# Patient Record
Sex: Female | Born: 1985
Health system: Southern US, Community
[De-identification: ages and names within clinical notes are randomized; demographics above are authoritative.]

## PROBLEM LIST (undated history)

## (undated) ENCOUNTER — Ambulatory Visit (HOSPITAL_COMMUNITY): Admission: EM | Discharge status: Absent without leave | Payer: Medicaid Other

## (undated) ENCOUNTER — Inpatient Hospital Stay (HOSPITAL_COMMUNITY): Payer: Self-pay

## (undated) DIAGNOSIS — G43909 Migraine, unspecified, not intractable, without status migrainosus: Secondary | ICD-10-CM

## (undated) DIAGNOSIS — Z789 Other specified health status: Secondary | ICD-10-CM

## (undated) DIAGNOSIS — R87629 Unspecified abnormal cytological findings in specimens from vagina: Secondary | ICD-10-CM

## (undated) DIAGNOSIS — F319 Bipolar disorder, unspecified: Secondary | ICD-10-CM

## (undated) DIAGNOSIS — F329 Major depressive disorder, single episode, unspecified: Secondary | ICD-10-CM

## (undated) DIAGNOSIS — D573 Sickle-cell trait: Secondary | ICD-10-CM

## (undated) DIAGNOSIS — F419 Anxiety disorder, unspecified: Secondary | ICD-10-CM

## (undated) DIAGNOSIS — R51 Headache: Secondary | ICD-10-CM

## (undated) DIAGNOSIS — B379 Candidiasis, unspecified: Secondary | ICD-10-CM

## (undated) DIAGNOSIS — Z8619 Personal history of other infectious and parasitic diseases: Secondary | ICD-10-CM

## (undated) DIAGNOSIS — F32A Depression, unspecified: Secondary | ICD-10-CM

## (undated) DIAGNOSIS — IMO0002 Reserved for concepts with insufficient information to code with codable children: Secondary | ICD-10-CM

## (undated) DIAGNOSIS — I10 Essential (primary) hypertension: Secondary | ICD-10-CM

## (undated) DIAGNOSIS — B999 Unspecified infectious disease: Secondary | ICD-10-CM

## (undated) HISTORY — DX: Personal history of other infectious and parasitic diseases: Z86.19

## (undated) HISTORY — DX: Reserved for concepts with insufficient information to code with codable children: IMO0002

## (undated) HISTORY — DX: Migraine, unspecified, not intractable, without status migrainosus: G43.909

## (undated) HISTORY — DX: Sickle-cell trait: D57.3

## (undated) HISTORY — DX: Headache: R51

## (undated) HISTORY — PX: DENTAL SURGERY: SHX609

## (undated) HISTORY — PX: NO PAST SURGERIES: SHX2092

## (undated) HISTORY — DX: Essential (primary) hypertension: I10

## (undated) HISTORY — DX: Candidiasis, unspecified: B37.9

---
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
## 2006-02-17 ENCOUNTER — Inpatient Hospital Stay (HOSPITAL_COMMUNITY): Admission: AD | Admit: 2006-02-17 | Discharge: 2006-02-17 | Payer: Self-pay | Admitting: Gynecology

## 2006-02-17 ENCOUNTER — Ambulatory Visit: Payer: Self-pay | Admitting: *Deleted

## 2006-03-31 ENCOUNTER — Ambulatory Visit: Payer: Self-pay | Admitting: Gynecology

## 2006-04-08 ENCOUNTER — Inpatient Hospital Stay (HOSPITAL_COMMUNITY): Admission: AD | Admit: 2006-04-08 | Discharge: 2006-04-08 | Payer: Self-pay | Admitting: Obstetrics & Gynecology

## 2006-04-08 ENCOUNTER — Ambulatory Visit: Payer: Self-pay | Admitting: Obstetrics & Gynecology

## 2006-04-08 ENCOUNTER — Inpatient Hospital Stay (HOSPITAL_COMMUNITY): Admission: AD | Admit: 2006-04-08 | Discharge: 2006-04-11 | Payer: Self-pay | Admitting: Obstetrics & Gynecology

## 2006-04-12 ENCOUNTER — Encounter (INDEPENDENT_AMBULATORY_CARE_PROVIDER_SITE_OTHER): Payer: Self-pay | Admitting: Specialist

## 2006-04-12 ENCOUNTER — Ambulatory Visit: Payer: Self-pay | Admitting: Obstetrics and Gynecology

## 2006-04-12 ENCOUNTER — Observation Stay (HOSPITAL_COMMUNITY): Admission: AD | Admit: 2006-04-12 | Discharge: 2006-04-13 | Payer: Self-pay | Admitting: Obstetrics and Gynecology

## 2007-01-07 ENCOUNTER — Inpatient Hospital Stay (HOSPITAL_COMMUNITY): Admission: AD | Admit: 2007-01-07 | Discharge: 2007-01-07 | Payer: Self-pay | Admitting: Gynecology

## 2007-08-10 ENCOUNTER — Inpatient Hospital Stay (HOSPITAL_COMMUNITY): Admission: AD | Admit: 2007-08-10 | Discharge: 2007-08-10 | Payer: Self-pay | Admitting: Obstetrics & Gynecology

## 2008-03-21 ENCOUNTER — Emergency Department (HOSPITAL_COMMUNITY): Admission: EM | Admit: 2008-03-21 | Discharge: 2008-03-21 | Payer: Self-pay | Admitting: Emergency Medicine

## 2008-08-04 ENCOUNTER — Emergency Department (HOSPITAL_COMMUNITY): Admission: EM | Admit: 2008-08-04 | Discharge: 2008-08-04 | Payer: Self-pay | Admitting: Emergency Medicine

## 2008-10-21 ENCOUNTER — Other Ambulatory Visit: Admission: RE | Admit: 2008-10-21 | Discharge: 2008-10-21 | Payer: Self-pay | Admitting: Family Medicine

## 2009-05-17 DIAGNOSIS — R87619 Unspecified abnormal cytological findings in specimens from cervix uteri: Secondary | ICD-10-CM

## 2009-05-17 DIAGNOSIS — IMO0002 Reserved for concepts with insufficient information to code with codable children: Secondary | ICD-10-CM

## 2009-05-17 HISTORY — DX: Reserved for concepts with insufficient information to code with codable children: IMO0002

## 2009-05-17 HISTORY — DX: Unspecified abnormal cytological findings in specimens from cervix uteri: R87.619

## 2009-06-23 ENCOUNTER — Emergency Department (HOSPITAL_COMMUNITY): Admission: EM | Admit: 2009-06-23 | Discharge: 2009-06-23 | Payer: Self-pay | Admitting: Emergency Medicine

## 2009-08-21 ENCOUNTER — Other Ambulatory Visit: Admission: RE | Admit: 2009-08-21 | Discharge: 2009-08-21 | Payer: Self-pay | Admitting: Family Medicine

## 2009-12-11 ENCOUNTER — Emergency Department (HOSPITAL_COMMUNITY): Admission: EM | Admit: 2009-12-11 | Discharge: 2009-12-12 | Payer: Self-pay | Admitting: Emergency Medicine

## 2010-04-12 ENCOUNTER — Emergency Department (HOSPITAL_COMMUNITY): Admission: EM | Admit: 2010-04-12 | Discharge: 2010-04-12 | Payer: Self-pay | Admitting: Family Medicine

## 2010-04-18 ENCOUNTER — Emergency Department (HOSPITAL_COMMUNITY)
Admission: EM | Admit: 2010-04-18 | Discharge: 2010-04-18 | Payer: Self-pay | Source: Home / Self Care | Admitting: Emergency Medicine

## 2010-07-28 LAB — RAPID URINE DRUG SCREEN, HOSP PERFORMED
Amphetamines: NOT DETECTED
Barbiturates: NOT DETECTED
Benzodiazepines: NOT DETECTED
Cocaine: NOT DETECTED
Opiates: NOT DETECTED
Tetrahydrocannabinol: NOT DETECTED

## 2010-07-28 LAB — SALICYLATE LEVEL: Salicylate Lvl: <4 mg/dL (ref 2.8–20.0)

## 2010-07-28 LAB — DIFFERENTIAL
Eosinophils Relative: 1 % (ref 0–5)
Lymphs Abs: 2.2 10*3/uL (ref 0.7–4.0)
Monocytes Relative: 9 % (ref 3–12)
Neutro Abs: 3.7 10*3/uL (ref 1.7–7.7)

## 2010-07-28 LAB — CBC
Hemoglobin: 11.9 g/dL — ABNORMAL LOW (ref 12.0–15.0)
MCH: 28.3 pg (ref 26.0–34.0)
MCV: 85.7 fL (ref 78.0–100.0)
RDW: 15.4 % (ref 11.5–15.5)
WBC: 6.6 10*3/uL (ref 4.0–10.5)

## 2010-07-28 LAB — BASIC METABOLIC PANEL
CO2: 24 mEq/L (ref 19–32)
Creatinine, Ser: 0.82 mg/dL (ref 0.4–1.2)
GFR calc Af Amer: >60 mL/min (ref >60)
Glucose, Bld: 102 mg/dL — ABNORMAL HIGH (ref 70–99)
Sodium: 139 mEq/L (ref 135–145)

## 2010-07-28 LAB — ACETAMINOPHEN LEVEL: Acetaminophen (Tylenol), Serum: <10 ug/mL — ABNORMAL LOW (ref 10–30)

## 2010-08-01 LAB — CBC
Hemoglobin: 11.6 g/dL — ABNORMAL LOW (ref 12.0–15.0)
MCHC: 33.5 g/dL (ref 30.0–36.0)
RBC: 3.93 MIL/uL (ref 3.87–5.11)
WBC: 7.7 10*3/uL (ref 4.0–10.5)

## 2010-08-01 LAB — DIFFERENTIAL
Basophils Relative: 1 % (ref 0–1)
Eosinophils Relative: 1 % (ref 0–5)
Lymphocytes Relative: 25 % (ref 12–46)
Monocytes Absolute: 0.7 10*3/uL (ref 0.1–1.0)
Neutro Abs: 5 10*3/uL (ref 1.7–7.7)
Neutrophils Relative %: 65 % (ref 43–77)

## 2010-08-01 LAB — COMPREHENSIVE METABOLIC PANEL
Albumin: 3.8 g/dL (ref 3.5–5.2)
Calcium: 8.7 mg/dL (ref 8.4–10.5)
Chloride: 104 mEq/L (ref 96–112)
GFR calc Af Amer: >60 mL/min (ref >60)
GFR calc non Af Amer: >60 mL/min (ref >60)
Glucose, Bld: 91 mg/dL (ref 70–99)
Sodium: 133 mEq/L — ABNORMAL LOW (ref 135–145)
Total Protein: 7.4 g/dL (ref 6.0–8.3)

## 2010-10-02 NOTE — Discharge Summary (Signed)
NAME:  Diana Roman, Diana Roman NO.:  [id number]   MEDICAL RECORD NO.:  [id number]          PATIENT TYPE:  OBV   LOCATION:  9309                          FACILITY:  [facility]   PHYSICIAN:  Ginger Carne, MD  DATE OF BIRTH:  Dec 03, 1985   DATE OF ADMISSION:  04/12/2006  DATE OF DISCHARGE:  04/13/2006                               DISCHARGE SUMMARY   REASON FOR HOSPITALIZATION:  Pelvic pain with complaint of something  emanating from vagina.   IN-HOSPITAL PROCEDURE:  Intravenous antibiotic therapy.   FINAL DIAGNOSES:  Retained placental tissue, post partum day #3 with  endomyometritis.   HISTORY OF PRESENT ILLNESS:  This patient is a 24 year old gravida 1,  para 1, 0-0-1 African-American female postpartum day #3 on admission,  status post normal vaginal delivery, who noted something hanging out of  my vagina with complaints of pelvic cramping and chills.  The patient  was evaluated in the maternity admission unit and at that time was  afebrile with a temperature of 99.6.  She had 2+ abdominal pelvic  tenderness.  Admission white count was 8.7, H&H 31.5 and 10.6,  respectively.  The patient was placed on Ancef.  Her urinalysis was  normal.  The patient's discomfort had abated throughout her admission.   On physical examination at the time of discharge, she had minimal to no  tenderness and scant vaginal flow.  The patient is breast-feeding.  Temperature never went above 99.6.  During her course of  hospitalization, her blood pressure ranged from 150/92, and on discharge  143/96.  The patient has no known history of chronic hypertension.   The patient is breast-feeding and followed at the health department.  Doxycycline is therefore contraindicated.  She was placed on Augmentin  500 mg 1 twice daily for 10 days and Vicodin 1-2 every 6 hours as needed  for pelvic pain.  The patient was advised to follow up in the health  department in about five days to re-evaluate her blood  pressure.  She  was also asked to return the hospital if she has increased pelvic or  abdominal pain or increased bleeding.  She was also asked to check her  temperature daily, and if it is above 100.4 degrees Fahrenheit, to  return to the maternity admission unit.  If she has no symptomatology  and is afebrile, I asked her to continue with her health department post  partum visit in about four weeks.  She was encouraged to complete her  antibiotic regimen.      Ginger Carne, MD  Electronically Signed     SHB/MEDQ  D:  04/13/2006  T:  04/13/2006  Job:  [id number]

## 2011-02-16 LAB — POCT PREGNANCY, URINE: Preg Test, Ur: NEGATIVE

## 2011-02-16 LAB — POCT URINALYSIS DIP (DEVICE)
Ketones, ur: NEGATIVE mg/dL
Nitrite: NEGATIVE
Specific Gravity, Urine: 1.015 (ref 1.005–1.030)

## 2011-05-09 ENCOUNTER — Encounter (HOSPITAL_COMMUNITY): Payer: Self-pay | Admitting: *Deleted

## 2011-05-09 ENCOUNTER — Inpatient Hospital Stay (HOSPITAL_COMMUNITY)
Admission: AD | Admit: 2011-05-09 | Discharge: 2011-05-09 | Disposition: A | Discharge status: Home / Self Care | Payer: Managed Care, Other (non HMO) | Source: Ambulatory Visit | Attending: Obstetrics & Gynecology | Admitting: Obstetrics & Gynecology

## 2011-05-09 DIAGNOSIS — IMO0002 Reserved for concepts with insufficient information to code with codable children: Secondary | ICD-10-CM | POA: Insufficient documentation

## 2011-05-09 DIAGNOSIS — M545 Low back pain, unspecified: Secondary | ICD-10-CM | POA: Insufficient documentation

## 2011-05-09 DIAGNOSIS — T148XXA Other injury of unspecified body region, initial encounter: Secondary | ICD-10-CM

## 2011-05-09 HISTORY — DX: Other specified health status: Z78.9

## 2011-05-09 LAB — URINALYSIS, ROUTINE W REFLEX MICROSCOPIC
Bilirubin Urine: NEGATIVE
Ketones, ur: NEGATIVE mg/dL
Nitrite: NEGATIVE
Specific Gravity, Urine: >1.03 — ABNORMAL HIGH (ref 1.005–1.030)

## 2011-05-09 LAB — URINE MICROSCOPIC-ADD ON

## 2011-05-09 MED ORDER — CYCLOBENZAPRINE HCL 10 MG PO TABS: 10.0000 mg | ORAL_TABLET | Freq: Three times a day (TID) | ORAL | Status: AC | PRN

## 2011-05-09 NOTE — ED Provider Notes (Signed)
History   Pt presents today c/o Right low back pain that comes and goes. She states the pain started yesterday and is located only in the right lower back. She is currently on her menses and states she is having NL menstrual cramping. She denies severe abd pain, vag irritation, fever, dysuria, or any other sx at this time.  Chief Complaint  Patient presents with  . Back Pain   HPI  OB History    Grav Para Term Preterm Abortions TAB SAB Ect Mult Living   1 1 1  0 0 0 0 0 0 1      Past Medical History  Diagnosis Date  . No pertinent past medical history     Past Surgical History  Procedure Date  . Dental surgery     History reviewed. No pertinent family history.  History  Substance Use Topics  . Smoking status: Never Smoker   . Smokeless tobacco: Not on file  . Alcohol Use: No    Allergies: No Known Allergies  No prescriptions prior to admission    Review of Systems  Constitutional: Negative for fever.  HENT: Negative for neck pain.   Eyes: Negative for blurred vision.  Cardiovascular: Negative for chest pain.  Gastrointestinal: Negative for nausea, vomiting, abdominal pain, diarrhea and constipation.  Genitourinary: Negative for dysuria, urgency and frequency.  Musculoskeletal: Positive for back pain. Negative for myalgias, joint pain and falls.  Neurological: Negative for dizziness and headaches.  Psychiatric/Behavioral: Negative for depression and suicidal ideas.   Physical Exam   Blood pressure 132/88, pulse 76, temperature 98.1 F (36.7 C), resp. rate 20, height 5\' 6"  (1.676 m), weight 229 lb (103.874 kg), last menstrual period 05/09/2011, SpO2 98.00%.  Physical Exam  Nursing note and vitals reviewed. Constitutional: She is oriented to person, place, and time. She appears well-developed and well-nourished. No distress.  HENT:  Head: Normocephalic and atraumatic.  Eyes: EOM are normal. Pupils are equal, round, and reactive to light.  GI: Soft. She  exhibits no distension and no mass. There is no tenderness. There is no rebound and no guarding.  Genitourinary: Uterus normal. There is bleeding around the vagina. No vaginal discharge found.       Uterus NL size and shape. No adnexal masses. Minimal bleeding noted on exam.  Musculoskeletal: She exhibits tenderness.       Lumbar back: She exhibits tenderness.       Back:  Neurological: She is alert and oriented to person, place, and time.  Skin: Skin is warm and dry. She is not diaphoretic.  Psychiatric: She has a normal mood and affect. Her behavior is normal. Thought content normal.    MAU Course  Procedures  Results for orders placed during the hospital encounter of 05/09/11 (from the past 24 hour(s))  URINALYSIS, ROUTINE W REFLEX MICROSCOPIC     Status: Abnormal   Collection Time   05/09/11  8:45 PM      Component Value Range   Color, Urine YELLOW  YELLOW    APPearance CLEAR  CLEAR    Specific Gravity, Urine >1.030 (*) 1.005 - 1.030    pH 6.0  5.0 - 8.0    Glucose, UA NEGATIVE  NEGATIVE (mg/dL)   Hgb urine dipstick LARGE (*) NEGATIVE    Bilirubin Urine NEGATIVE  NEGATIVE    Ketones, ur NEGATIVE  NEGATIVE (mg/dL)   Protein, ur 413 (*) NEGATIVE (mg/dL)   Urobilinogen, UA 1.0  0.0 - 1.0 (mg/dL)   Nitrite NEGATIVE  NEGATIVE    Leukocytes, UA TRACE (*) NEGATIVE   URINE MICROSCOPIC-ADD ON     Status: Abnormal   Collection Time   05/09/11  8:45 PM      Component Value Range   Squamous Epithelial / LPF FEW (*) RARE    WBC, UA 3-6  <3 (WBC/hpf)   RBC / HPF TOO NUMEROUS TO COUNT  <3 (RBC/hpf)   Bacteria, UA FEW (*) RARE    Urine-Other MUCOUS PRESENT    POCT PREGNANCY, URINE     Status: Normal   Collection Time   05/09/11  8:55 PM      Component Value Range   Preg Test, Ur NEGATIVE     Urine sent for culture.  Assessment and Plan  Muscle strain: discussed with pt at length. Will give Rx for flexeril. Will send urine for culture. She will f/u with her PCP. Discussed diet,  activity, risks, and precautions.    Clinton Gallant. Rice III, DrHSc, MPAS, PA-C  05/09/2011, 9:05 PM   Henrietta Hoover, PA 05/09/11 2115

## 2011-05-09 NOTE — Progress Notes (Signed)
Pt presents for lower back pain that started yesterday.  States pain became worse around 5 or 6 pm tonight.  Comes and goes about every 30 seconds.

## 2011-05-09 NOTE — Progress Notes (Signed)
Pt states that she started having pain in her rt flank side this morning and that it comes and goes

## 2011-05-09 NOTE — Progress Notes (Signed)
Jenean Lindau, PA at bedside.  Assessment done and poc disscussed with pt.

## 2011-05-09 NOTE — Progress Notes (Signed)
VE done per CNM 

## 2011-12-15 ENCOUNTER — Ambulatory Visit (INDEPENDENT_AMBULATORY_CARE_PROVIDER_SITE_OTHER): Payer: Private Health Insurance - Indemnity | Admitting: Obstetrics and Gynecology

## 2011-12-15 VITALS — BP 118/88 | HR 90 | Ht 66.0 in | Wt 223.0 lb

## 2011-12-15 DIAGNOSIS — N979 Female infertility, unspecified: Secondary | ICD-10-CM

## 2011-12-15 DIAGNOSIS — Z124 Encounter for screening for malignant neoplasm of cervix: Secondary | ICD-10-CM
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
## 2011-12-17 ENCOUNTER — Other Ambulatory Visit: Payer: Self-pay

## 2011-12-17 ENCOUNTER — Telehealth: Payer: Self-pay | Admitting: Obstetrics and Gynecology

## 2011-12-17 ENCOUNTER — Other Ambulatory Visit: Payer: Self-pay | Admitting: Obstetrics and Gynecology

## 2011-12-17 DIAGNOSIS — E049 Nontoxic goiter, unspecified: Secondary | ICD-10-CM

## 2011-12-17 DIAGNOSIS — N926 Irregular menstruation, unspecified: Secondary | ICD-10-CM

## 2011-12-17 LAB — PAP IG W/ RFLX HPV ASCU

## 2011-12-17 NOTE — Telephone Encounter (Signed)
Niccole/saw ND last

## 2011-12-22 ENCOUNTER — Other Ambulatory Visit: Payer: Private Health Insurance - Indemnity

## 2012-03-28 ENCOUNTER — Telehealth: Payer: Self-pay | Admitting: Obstetrics and Gynecology

## 2012-03-28 NOTE — Telephone Encounter (Signed)
nd to address.

## 2012-03-31 NOTE — Telephone Encounter (Signed)
You can tell the pt that her husbands SA was abnormal and they should schedule an appointment with me or come in as scheduled to review the results in detail.

## 2012-04-03 NOTE — Telephone Encounter (Signed)
Ng to address

## 2012-04-03 NOTE — Telephone Encounter (Signed)
Try calling pt rgd labs no answer unable to leave msg 

## 2012-04-04 ENCOUNTER — Telehealth: Payer: Self-pay | Admitting: Obstetrics and Gynecology

## 2012-04-04 NOTE — Telephone Encounter (Signed)
LVM for pt advising that results are back but provider has not reviewed yet. Will have provider review and call back with results.  [name]

## 2012-04-05 ENCOUNTER — Telehealth: Payer: Self-pay

## 2012-04-05 NOTE — Telephone Encounter (Signed)
Message copied by [name] on Wed Apr 05, 2012  8:26 AM ------      Message from: Jaymes Graff      Created: Tue Apr 04, 2012 11:09 PM       It was abnormal she needs to come in for her visit with her husband to discuss it      ----- Message -----         From: [name], CMA         Sent: 04/04/2012   2:42 PM           To: Michael Litter, MD            Pt is calling to get results of semen analysis. Is scanned into the system but no notes. Will you please look over report to advise what to tell the pt.              Thanks,      Brandye Inthavong D.

## 2012-04-05 NOTE — Telephone Encounter (Signed)
Scheduled apt Per ND to discuss semen analysis 04/18/12 @ 8:15 Pt voiced understanding Diana Roman, [name]

## 2012-04-18 ENCOUNTER — Ambulatory Visit (INDEPENDENT_AMBULATORY_CARE_PROVIDER_SITE_OTHER): Payer: Private Health Insurance - Indemnity | Admitting: Obstetrics and Gynecology

## 2012-04-18 ENCOUNTER — Encounter: Payer: Self-pay | Admitting: Obstetrics and Gynecology

## 2012-04-18 VITALS — BP 120/76 | Wt 223.0 lb

## 2012-04-18 DIAGNOSIS — N4601 Organic azoospermia: Secondary | ICD-10-CM

## 2012-04-18 DIAGNOSIS — N760 Acute vaginitis: Secondary | ICD-10-CM

## 2012-04-18 DIAGNOSIS — A499 Bacterial infection, unspecified: Secondary | ICD-10-CM

## 2012-04-18 MED ORDER — METRONIDAZOLE 500 MG PO TABS: 500.0000 mg | ORAL_TABLET | Freq: Two times a day (BID) | ORAL | Status: DC

## 2012-04-18 NOTE — Patient Instructions (Signed)
Hysterosalpingography  Hysterosalpingography is a procedure using an X-ray and contrast dye to look at the inside of your uterus and fallopian tubes. The contrast dye is injected into the uterus through the vagina and cervix while X-ray pictures are taken. This procedure may help your caregiver determine whether you have uterine tumors, adhesions, or structural abnormalities. It is commonly used to help determine reasons why a woman is unable to have children (infertile).  LET YOUR CAREGIVER KNOW ABOUT:  · Allergies to medicine or food, especially shellfish.  · Medicines taken, including vitamins, herbs, eyedrops, over-the-counter medicines, and creams.  · Use of steroids (by mouth or creams).  · Previous problems with anesthetics or numbing medicines.  · History of bleeding problems or blood clots.  · Previous pelvic surgery.  · Other health problems, including diabetes and kidney problems.  · Possibility of pregnancy.  · Any allergies to iodine or X-ray dye (contrast dye).  · Any recent pelvic infections or sexually transmitted diseases (STDs).  RISKS AND COMPLICATIONS   · Infection in the lining of the uterus (endometritis) or fallopian tubes (salpingitis).  · Damage or puncturing of the uterus or fallopian tubes.  · An allergic reaction to the contrast dye used to perform the X-ray.  BEFORE THE PROCEDURE   · Schedule the procedure after your period stops, but before your next ovulation. This is usually between day 5 and 10 of your last period. Day 1 is the first day of your period.  · Ask your caregiver about changing or stopping your regular medicines.  · You may eat and drink as normal.  · You may be given a medicine to relax you (sedative) or an over-the-counter pain medicine to lessen any discomfort during the procedure.  · Empty your bladder before the procedure begins.  PROCEDURE  · You will lie down on an X-ray table with your feet in stirrups.  · A device called a speculum will be placed into your  vagina. This allows your caregiver to see inside your vagina to the cervix.  · The cervix will be washed with a special soap.  · A thin, flexible tube will be passed through the cervix into the uterus.  · Contrast dye will be put into this tube.  · Several X-rays will be taken as the contrast dye spreads through the uterus and fallopian tubes.  · The tube will be taken out after the procedure.  · The procedure usually lasts about 15 to 30 minutes.  AFTER THE PROCEDURE   · Most of the contrast dye will flow out naturally. You may want to wear a sanitary pad.  · You may have cramping and spotting. This should go away in 24 hours.  · Ask when your test results will be ready. Make sure you get your test results.   Document Released: 06/05/2004 Document Revised: 07/26/2011 Document Reviewed: 03/23/2011  [employer]® Patient Information ©2013 [employer].

## 2012-04-18 NOTE — Progress Notes (Signed)
Discuss results of semen analysis. It was sig for very few motile sperm.  The husband said he followed instructions.   Abnormal SA husband was referred to Dr Brunilda Payor Pt to RT for day 21 labs and needs an HSG BV on pap .  Will tx with flagyl

## 2012-04-24 ENCOUNTER — Other Ambulatory Visit: Payer: Private Health Insurance - Indemnity

## 2012-04-25 ENCOUNTER — Other Ambulatory Visit: Payer: Private Health Insurance - Indemnity

## 2012-05-04 ENCOUNTER — Encounter: Payer: Private Health Insurance - Indemnity | Admitting: Obstetrics and Gynecology

## 2012-05-14 ENCOUNTER — Emergency Department (HOSPITAL_COMMUNITY)
Admission: EM | Admit: 2012-05-14 | Discharge: 2012-05-14 | Disposition: A | Discharge status: Home / Self Care | Payer: Private Health Insurance - Indemnity | Attending: Emergency Medicine | Admitting: Emergency Medicine

## 2012-05-14 ENCOUNTER — Encounter (HOSPITAL_COMMUNITY): Payer: Self-pay | Admitting: *Deleted

## 2012-05-14 DIAGNOSIS — I10 Essential (primary) hypertension: Secondary | ICD-10-CM | POA: Insufficient documentation

## 2012-05-14 DIAGNOSIS — K089 Disorder of teeth and supporting structures, unspecified: Secondary | ICD-10-CM | POA: Insufficient documentation

## 2012-05-14 DIAGNOSIS — G43909 Migraine, unspecified, not intractable, without status migrainosus: Secondary | ICD-10-CM | POA: Insufficient documentation

## 2012-05-14 DIAGNOSIS — K0889 Other specified disorders of teeth and supporting structures: Secondary | ICD-10-CM

## 2012-05-14 MED ORDER — TRAMADOL HCL 50 MG PO TABS
50.0000 mg | ORAL_TABLET | Freq: Once | ORAL | Status: AC
  Administered 2012-05-14: 50 mg via ORAL
  Filled 2012-05-14: qty 1

## 2012-05-14 MED ORDER — TRAMADOL HCL 50 MG PO TABS: 50.0000 mg | ORAL_TABLET | Freq: Four times a day (QID) | ORAL | Status: DC | PRN

## 2012-05-14 NOTE — ED Provider Notes (Signed)
Medical screening examination/treatment/procedure(s) were performed by non-physician practitioner and as supervising physician I was immediately available for consultation/collaboration.  Yetunde Leis T Tilden Broz, MD 05/14/12 2222 

## 2012-05-14 NOTE — ED Provider Notes (Signed)
History     CSN: [id number]  Arrival date & time 05/14/12  2128   First MD Initiated Contact with Patient 05/14/12 2145      Chief Complaint  Patient presents with  . Dental Pain    (Consider location/radiation/quality/duration/timing/severity/associated sxs/prior treatment) HPI Comments: Patient fisher, with, dental pain, it yesterday.  Last night.  She took some 500 mg Tylenol with little relief.  She does not have a dentist.  She also used Orajel with minimal relief  Patient is a 26 y.o. female presenting with tooth pain. The history is provided by the patient.  Dental PainThe primary symptoms include mouth pain. Primary symptoms do not include headaches or fever.  Additional symptoms do not include: facial swelling.    Past Medical History  Diagnosis Date  . No pertinent past medical history   . Abnormal Pap smear 2011  . Yeast infection   . H/O bacterial infection   . Sickle cell trait   . Headache     Frequent  . Migraine   . Hypertension   . H/O varicella     Past Surgical History  Procedure Date  . Dental surgery     No family history on file.  History  Substance Use Topics  . Smoking status: Never Smoker   . Smokeless tobacco: Not on file  . Alcohol Use: No    OB History    Grav Para Term Preterm Abortions TAB SAB Ect Mult Living   2 1 1  0 0 0 0 0 0 1      Review of Systems  Constitutional: Negative for fever and chills.  HENT: Positive for dental problem. Negative for facial swelling.   Respiratory: Negative.   Cardiovascular: Negative.   Gastrointestinal: Negative.   Genitourinary: Negative.   Musculoskeletal: Negative.   Skin: Negative.   Neurological: Negative for dizziness and headaches.  All other systems reviewed and are negative.    Allergies  Review of patient's allergies indicates no known allergies.  Home Medications   Current Outpatient Rx  Name  Route  Sig  Dispense  Refill  . ACETAMINOPHEN 500 MG PO TABS   Oral  Take 1,000 mg by mouth every 4 (four) hours as needed. For mouth pain         . ORAJEL MT   Mouth/Throat   Use as directed 1 application in the mouth or throat every hour as needed. For tooth pain         . METRONIDAZOLE 500 MG PO TABS   Oral   Take 1 tablet (500 mg total) by mouth 2 (two) times daily.   14 tablet   0   . TRAMADOL HCL 50 MG PO TABS   Oral   Take 1 tablet (50 mg total) by mouth every 6 (six) hours as needed for pain.   30 tablet   0     BP 131/94  Pulse 78  Temp 98.6 F (37 C) (Oral)  Resp 20  SpO2 99%  LMP 04/03/2012  Breastfeeding? Unknown  Physical Exam  Constitutional: She appears well-developed.  HENT:  Head: Normocephalic.  Mouth/Throat: Uvula is midline.    Eyes: Pupils are equal, round, and reactive to light.  Neck: Normal range of motion.  Cardiovascular: Normal rate.   Pulmonary/Chest: Effort normal.  Musculoskeletal: Normal range of motion.  Lymphadenopathy:    She has no cervical adenopathy.  Skin: Skin is warm.    ED Course  Procedures (including critical care time)  Labs  Reviewed - No data to display No results found.   1. Pain, dental       MDM   Patient refused dental block        Arman Filter, NP 05/14/12 2201

## 2012-05-14 NOTE — ED Notes (Signed)
Pt reports Lt lower tooth pain.

## 2012-07-25 ENCOUNTER — Telehealth: Payer: Self-pay

## 2012-07-25 NOTE — Telephone Encounter (Signed)
TC from pt requesting Plan B. LMP 07/03/12 & last unprotected IC last night per pt. Plan B called to [facility] 1 tab q 12 hrs x 2 doses. Pt told to inform us if no menses within 21 days. Pt agrees and voices understanding.

## 2012-09-09 ENCOUNTER — Emergency Department (HOSPITAL_COMMUNITY)
Admission: EM | Admit: 2012-09-09 | Discharge: 2012-09-09 | Disposition: A | Discharge status: Home / Self Care | Payer: Private Health Insurance - Indemnity | Attending: Emergency Medicine | Admitting: Emergency Medicine

## 2012-09-09 ENCOUNTER — Encounter (HOSPITAL_COMMUNITY): Payer: Self-pay | Admitting: Emergency Medicine

## 2012-09-09 DIAGNOSIS — S29019A Strain of muscle and tendon of unspecified wall of thorax, initial encounter: Secondary | ICD-10-CM

## 2012-09-09 DIAGNOSIS — Z862 Personal history of diseases of the blood and blood-forming organs and certain disorders involving the immune mechanism: Secondary | ICD-10-CM | POA: Insufficient documentation

## 2012-09-09 DIAGNOSIS — Y9389 Activity, other specified: Secondary | ICD-10-CM | POA: Insufficient documentation

## 2012-09-09 DIAGNOSIS — Z8619 Personal history of other infectious and parasitic diseases: Secondary | ICD-10-CM | POA: Insufficient documentation

## 2012-09-09 DIAGNOSIS — M5380 Other specified dorsopathies, site unspecified: Secondary | ICD-10-CM | POA: Insufficient documentation

## 2012-09-09 DIAGNOSIS — I10 Essential (primary) hypertension: Secondary | ICD-10-CM | POA: Insufficient documentation

## 2012-09-09 DIAGNOSIS — Y92410 Unspecified street and highway as the place of occurrence of the external cause: Secondary | ICD-10-CM | POA: Insufficient documentation

## 2012-09-09 DIAGNOSIS — Z79899 Other long term (current) drug therapy: Secondary | ICD-10-CM | POA: Insufficient documentation

## 2012-09-09 DIAGNOSIS — S239XXA Sprain of unspecified parts of thorax, initial encounter: Secondary | ICD-10-CM | POA: Insufficient documentation

## 2012-09-09 DIAGNOSIS — Z8669 Personal history of other diseases of the nervous system and sense organs: Secondary | ICD-10-CM | POA: Insufficient documentation

## 2012-09-09 DIAGNOSIS — Z3202 Encounter for pregnancy test, result negative: Secondary | ICD-10-CM | POA: Insufficient documentation

## 2012-09-09 DIAGNOSIS — Z8679 Personal history of other diseases of the circulatory system: Secondary | ICD-10-CM | POA: Insufficient documentation

## 2012-09-09 DIAGNOSIS — M62830 Muscle spasm of back: Secondary | ICD-10-CM

## 2012-09-09 LAB — URINALYSIS, ROUTINE W REFLEX MICROSCOPIC
Leukocytes, UA: NEGATIVE
Protein, ur: NEGATIVE mg/dL
Specific Gravity, Urine: 1.027 (ref 1.005–1.030)
Urobilinogen, UA: 1 mg/dL (ref 0.0–1.0)

## 2012-09-09 LAB — POCT PREGNANCY, URINE: Preg Test, Ur: NEGATIVE

## 2012-09-09 MED ORDER — HYDROCODONE-ACETAMINOPHEN 5-325 MG PO TABS
2.0000 | ORAL_TABLET | Freq: Once | ORAL | Status: AC
  Administered 2012-09-09: 2 via ORAL
  Filled 2012-09-09: qty 2

## 2012-09-09 MED ORDER — NAPROXEN 500 MG PO TABS: 500.0000 mg | ORAL_TABLET | Freq: Two times a day (BID) | ORAL | Status: DC | PRN

## 2012-09-09 MED ORDER — DIAZEPAM 5 MG PO TABS
10.0000 mg | ORAL_TABLET | Freq: Once | ORAL | Status: AC
  Administered 2012-09-09: 10 mg via ORAL
  Filled 2012-09-09: qty 2

## 2012-09-09 MED ORDER — METHOCARBAMOL 750 MG PO TABS: 750.0000 mg | ORAL_TABLET | Freq: Four times a day (QID) | ORAL | Status: DC | PRN

## 2012-09-09 MED ORDER — HYDROCODONE-ACETAMINOPHEN 5-325 MG PO TABS: 1.0000 | ORAL_TABLET | Freq: Four times a day (QID) | ORAL | Status: DC | PRN
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
## 2012-09-09 NOTE — ED Provider Notes (Signed)
Medical screening examination/treatment/procedure(s) were performed by non-physician practitioner and as supervising physician I was immediately available for consultation/collaboration.  Jomaira Darr T Dior Dominik, MD 09/09/12 2317 
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
## 2012-09-10 ENCOUNTER — Telehealth (HOSPITAL_COMMUNITY): Payer: Self-pay | Admitting: Emergency Medicine

## 2012-09-11 ENCOUNTER — Emergency Department (INDEPENDENT_AMBULATORY_CARE_PROVIDER_SITE_OTHER)
Admission: EM | Admit: 2012-09-11 | Discharge: 2012-09-11 | Disposition: A | Discharge status: Home / Self Care | Payer: Private Health Insurance - Indemnity | Source: Home / Self Care | Attending: Emergency Medicine | Admitting: Emergency Medicine

## 2012-09-11 ENCOUNTER — Encounter (HOSPITAL_COMMUNITY): Payer: Self-pay

## 2012-09-11 DIAGNOSIS — M76899 Other specified enthesopathies of unspecified lower limb, excluding foot: Secondary | ICD-10-CM

## 2012-09-11 DIAGNOSIS — M7062 Trochanteric bursitis, left hip: Secondary | ICD-10-CM

## 2012-09-11 NOTE — ED Provider Notes (Signed)
Medical screening examination/treatment/procedure(s) were performed by non-physician practitioner and as supervising physician I was immediately available for consultation/collaboration.  Leslee Home, M.D.   Reuben Likes, MD 09/11/12 1452
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
## 2013-08-06 ENCOUNTER — Emergency Department (HOSPITAL_COMMUNITY)
Admission: EM | Admit: 2013-08-06 | Discharge: 2013-08-06 | Disposition: A | Discharge status: Home / Self Care | Payer: Private Health Insurance - Indemnity | Source: Home / Self Care | Attending: Family Medicine | Admitting: Family Medicine

## 2013-08-06 ENCOUNTER — Encounter (HOSPITAL_COMMUNITY): Payer: Self-pay | Admitting: Emergency Medicine

## 2013-08-06 DIAGNOSIS — G43009 Migraine without aura, not intractable, without status migrainosus: Secondary | ICD-10-CM

## 2013-08-06 HISTORY — DX: Bipolar disorder, unspecified: F31.9

## 2013-08-06 MED ORDER — METHYLPREDNISOLONE ACETATE 40 MG/ML IJ SUSP
80.0000 mg | Freq: Once | INTRAMUSCULAR | Status: AC
  Administered 2013-08-06: 80 mg via INTRAMUSCULAR

## 2013-08-06 MED ORDER — DIPHENHYDRAMINE HCL 50 MG/ML IJ SOLN
INTRAMUSCULAR | Status: AC
  Filled 2013-08-06: qty 1

## 2013-08-06 MED ORDER — ONDANSETRON 4 MG PO TBDP
8.0000 mg | ORAL_TABLET | Freq: Once | ORAL | Status: AC
  Administered 2013-08-06: 8 mg via ORAL

## 2013-08-06 MED ORDER — DIPHENHYDRAMINE HCL 50 MG/ML IJ SOLN
50.0000 mg | Freq: Once | INTRAMUSCULAR | Status: AC
  Administered 2013-08-06: 50 mg via INTRAMUSCULAR

## 2013-08-06 MED ORDER — ONDANSETRON 4 MG PO TBDP
ORAL_TABLET | ORAL | Status: AC
Start: 2013-08-06 — End: 2013-08-06
  Filled 2013-08-06: qty 2

## 2013-08-06 MED ORDER — METHYLPREDNISOLONE ACETATE 80 MG/ML IJ SUSP
INTRAMUSCULAR | Status: AC
  Filled 2013-08-06: qty 1

## 2013-08-06 NOTE — ED Provider Notes (Signed)
CSN: [id number]     Arrival date & time 08/06/13  1150 History   First MD Initiated Contact with Patient 08/06/13 1405     Chief Complaint  Patient presents with  . Headache   (Consider location/radiation/quality/duration/timing/severity/associated sxs/prior Treatment) Patient is a 28 y.o. female presenting with headaches. The history is provided by the patient.  Headache Pain location:  R temporal Quality:  Dull Radiates to:  Does not radiate Onset quality:  Gradual Duration:  4 days Progression:  Waxing and waning Chronicity:  Chronic Similar to prior headaches: yes   Context: bright light and loud noise   Ineffective treatments:  NSAIDs Associated symptoms: nausea and sinus pressure   Associated symptoms: no blurred vision, no neck stiffness, no numbness, no visual change and no vomiting     Past Medical History  Diagnosis Date  . No pertinent past medical history   . Abnormal Pap smear 2011  . Yeast infection   . H/O bacterial infection   . Sickle cell trait   . Headache(784.0)     Frequent  . Migraine   . Hypertension   . H/O varicella   . Bipolar 1 disorder    Past Surgical History  Procedure Laterality Date  . Dental surgery     History reviewed. No pertinent family history. History  Substance Use Topics  . Smoking status: Never Smoker   . Smokeless tobacco: Not on file  . Alcohol Use: Yes     Comment: Ocassionally wine   OB History   Grav Para Term Preterm Abortions TAB SAB Ect Mult Living   2 1 1  0 0 0 0 0 0 1     Review of Systems  Constitutional: Negative.   HENT: Positive for sinus pressure.   Eyes: Negative.  Negative for blurred vision.  Gastrointestinal: Positive for nausea. Negative for vomiting.  Musculoskeletal: Negative for neck stiffness.  Neurological: Positive for headaches. Negative for weakness and numbness.    Allergies  Review of patient's allergies indicates no known allergies.  Home Medications   Current Outpatient Rx   Name  Route  Sig  Dispense  Refill  . ALPRAZolam (XANAX) 0.5 MG tablet   Oral   Take 0.5 mg by mouth 2 (two) times daily as needed for anxiety.         . lamoTRIgine (LAMICTAL) 200 MG tablet   Oral   Take 200 mg by mouth daily.         Marland Kitchen desvenlafaxine (PRISTIQ) 50 MG 24 hr tablet   Oral   Take 50 mg by mouth at bedtime.         Marland Kitchen HYDROcodone-acetaminophen (NORCO/VICODIN) 5-325 MG per tablet   Oral   Take 1 tablet by mouth every 6 (six) hours as needed for pain (Take 1 - 2 tablets every 4 - 6 hours.).   20 tablet   0   . methocarbamol (ROBAXIN) 750 MG tablet   Oral   Take 1 tablet (750 mg total) by mouth 4 (four) times daily as needed (Take 1 tablet every 6 hours as needed for muscle spasms.).   20 tablet   0   . naproxen (NAPROSYN) 500 MG tablet   Oral   Take 1 tablet (500 mg total) by mouth 2 (two) times daily as needed.   30 tablet   0   . nortriptyline (PAMELOR) 50 MG capsule   Oral   Take 50 mg by mouth at bedtime.  BP 146/99  Pulse 52  Temp(Src) 98.5 F (36.9 C) (Oral)  Resp 16  SpO2 100%  LMP 07/23/2013 Physical Exam  Nursing note and vitals reviewed. Constitutional: She is oriented to person, place, and time. She appears well-developed and well-nourished. No distress.  HENT:  Head: Normocephalic.  Right Ear: External ear normal.  Left Ear: External ear normal.  Mouth/Throat: Oropharynx is clear and moist.  Eyes: Conjunctivae and EOM are normal. Pupils are equal, round, and reactive to light.  Neck: Normal range of motion. Neck supple.  Cardiovascular: Normal rate and normal heart sounds.   Pulmonary/Chest: Breath sounds normal.  Lymphadenopathy:    She has no cervical adenopathy.  Neurological: She is alert and oriented to person, place, and time. No cranial nerve deficit. Coordination normal.  Skin: Skin is warm and dry.    ED Course  Procedures (including critical care time) Labs Review Labs Reviewed - No data to  display Imaging Review No results found.   MDM   1. Migraine headache without aura        Linna HoffJames D Morton Simson, MD 08/06/13 1426

## 2013-08-06 NOTE — ED Notes (Signed)
State she has history of MHA . Has not had one for about 1 year. Some relief w motrin, w her 3 day duration HA . HA worse w light, noises

## 2013-09-22 ENCOUNTER — Emergency Department (HOSPITAL_COMMUNITY)
Admission: EM | Admit: 2013-09-22 | Discharge: 2013-09-22 | Disposition: A | Discharge status: Home / Self Care | Payer: Private Health Insurance - Indemnity | Attending: Emergency Medicine | Admitting: Emergency Medicine

## 2013-09-22 ENCOUNTER — Emergency Department (HOSPITAL_COMMUNITY): Payer: Private Health Insurance - Indemnity

## 2013-09-22 ENCOUNTER — Encounter (HOSPITAL_COMMUNITY): Payer: Self-pay | Admitting: Emergency Medicine

## 2013-09-22 DIAGNOSIS — G43909 Migraine, unspecified, not intractable, without status migrainosus: Secondary | ICD-10-CM | POA: Insufficient documentation

## 2013-09-22 DIAGNOSIS — F319 Bipolar disorder, unspecified: Secondary | ICD-10-CM | POA: Insufficient documentation

## 2013-09-22 DIAGNOSIS — Z862 Personal history of diseases of the blood and blood-forming organs and certain disorders involving the immune mechanism: Secondary | ICD-10-CM | POA: Insufficient documentation

## 2013-09-22 DIAGNOSIS — R1110 Vomiting, unspecified: Secondary | ICD-10-CM | POA: Insufficient documentation

## 2013-09-22 DIAGNOSIS — R0789 Other chest pain: Secondary | ICD-10-CM | POA: Insufficient documentation

## 2013-09-22 DIAGNOSIS — R197 Diarrhea, unspecified: Secondary | ICD-10-CM | POA: Insufficient documentation

## 2013-09-22 DIAGNOSIS — Z79899 Other long term (current) drug therapy: Secondary | ICD-10-CM | POA: Insufficient documentation

## 2013-09-22 DIAGNOSIS — Z8619 Personal history of other infectious and parasitic diseases: Secondary | ICD-10-CM | POA: Insufficient documentation

## 2013-09-22 DIAGNOSIS — R42 Dizziness and giddiness: Secondary | ICD-10-CM | POA: Insufficient documentation

## 2013-09-22 LAB — CBC WITH DIFFERENTIAL/PLATELET
Basophils Absolute: 0 10*3/uL (ref 0.0–0.1)
Basophils Relative: 1 % (ref 0–1)
EOS PCT: 1 % (ref 0–5)
Eosinophils Absolute: 0 10*3/uL (ref 0.0–0.7)
HEMATOCRIT: 35.7 % — AB (ref 36.0–46.0)
HEMOGLOBIN: 11.3 g/dL — AB (ref 12.0–15.0)
LYMPHS ABS: 1.4 10*3/uL (ref 0.7–4.0)
LYMPHS PCT: 38 % (ref 12–46)
MCH: 29.2 pg (ref 26.0–34.0)
MCHC: 31.7 g/dL (ref 30.0–36.0)
MCV: 92.2 fL (ref 78.0–100.0)
MONO ABS: 0.4 10*3/uL (ref 0.1–1.0)
MONOS PCT: 9 % (ref 3–12)
NEUTROS ABS: 1.9 10*3/uL (ref 1.7–7.7)
Neutrophils Relative %: 51 % (ref 43–77)
Platelets: 207 10*3/uL (ref 150–400)
RBC: 3.87 MIL/uL (ref 3.87–5.11)
RDW: 14.4 % (ref 11.5–15.5)
WBC: 3.8 10*3/uL — AB (ref 4.0–10.5)

## 2013-09-22 LAB — BASIC METABOLIC PANEL
BUN: 17 mg/dL (ref 6–23)
CHLORIDE: 105 meq/L (ref 96–112)
CO2: 26 mEq/L (ref 19–32)
Calcium: 9.1 mg/dL (ref 8.4–10.5)
Creatinine, Ser: 0.86 mg/dL (ref 0.50–1.10)
GFR calc non Af Amer: >90 mL/min (ref >90)
Glucose, Bld: 73 mg/dL (ref 70–99)
POTASSIUM: 4 meq/L (ref 3.7–5.3)
SODIUM: 142 meq/L (ref 137–147)

## 2013-09-22 LAB — I-STAT TROPONIN, ED: Troponin i, poc: 0 ng/mL (ref 0.00–0.08)

## 2013-09-22 LAB — HCG, SERUM, QUALITATIVE: Preg, Serum: NEGATIVE

## 2013-09-22 LAB — D-DIMER, QUANTITATIVE (NOT AT ARMC)

## 2013-09-22 MED ORDER — DEXAMETHASONE SODIUM PHOSPHATE 10 MG/ML IJ SOLN
10.0000 mg | Freq: Once | INTRAMUSCULAR | Status: AC
  Administered 2013-09-22: 10 mg via INTRAVENOUS
  Filled 2013-09-22: qty 1

## 2013-09-22 MED ORDER — SODIUM CHLORIDE 0.9 % IV BOLUS (SEPSIS)
1000.0000 mL | Freq: Once | INTRAVENOUS | Status: AC
Start: 2013-09-22 — End: 2013-09-22
  Administered 2013-09-22: 1000 mL via INTRAVENOUS

## 2013-09-22 MED ORDER — KETOROLAC TROMETHAMINE 30 MG/ML IJ SOLN
INTRAMUSCULAR | Status: AC
  Filled 2013-09-22: qty 1

## 2013-09-22 MED ORDER — PROCHLORPERAZINE EDISYLATE 5 MG/ML IJ SOLN
10.0000 mg | Freq: Once | INTRAMUSCULAR | Status: AC
  Administered 2013-09-22: 10 mg via INTRAVENOUS
  Filled 2013-09-22: qty 2

## 2013-09-22 MED ORDER — METHOCARBAMOL 500 MG PO TABS: 500.0000 mg | ORAL_TABLET | Freq: Two times a day (BID) | ORAL | Status: DC | PRN

## 2013-09-22 MED ORDER — KETOROLAC TROMETHAMINE 30 MG/ML IJ SOLN
15.0000 mg | Freq: Once | INTRAMUSCULAR | Status: AC
  Administered 2013-09-22: 15 mg via INTRAVENOUS
  Filled 2013-09-22: qty 1

## 2013-09-22 MED ORDER — DIPHENHYDRAMINE HCL 50 MG/ML IJ SOLN
25.0000 mg | Freq: Once | INTRAMUSCULAR | Status: AC
  Administered 2013-09-22: 25 mg via INTRAVENOUS
  Filled 2013-09-22: qty 1

## 2013-09-22 NOTE — ED Notes (Addendum)
Pt presents to department via [facility] for evaluation of R sided chest pain and lightheadedness. Pt was at work when symptoms started this morning. Also states R sided headache upon arrival. Pt is conscious alert and oriented x4. 20g L hand. No neurological deficits noted. Received 324 ASA and (2) sublingual nitroglycerin tablets.

## 2013-09-22 NOTE — ED Provider Notes (Signed)
Medical screening examination/treatment/procedure(s) were performed by non-physician practitioner and as supervising physician I was immediately available for consultation/collaboration.   EKG Interpretation None       Toy BakerAnthony T Edan Juday, MD 09/22/13 1525

## 2013-09-22 NOTE — ED Provider Notes (Signed)
CSN: [id number]     Arrival date & time 09/22/13  1053 History   First MD Initiated Contact with Patient 09/22/13 1105     Chief Complaint  Patient presents with  . Chest Pain     (Consider location/radiation/quality/duration/timing/severity/associated sxs/prior Treatment) HPI  Diana Roman is a 28 y.o. female with past medical history significant for bipolar, hypertension, migraine complaining of acute onset of sharp right anterior chest pain onset at 8:30 AM it is intermittent and lasts for a few seconds. It is described as sharp and 10 out of 10 in severity. Patient is brought in by EMS who administered full dose aspirin and 2 sublingual nitroglycerin tabs. Patient reports no improvement in symptoms status post nitroglycerin. It is associated with a lightheaded sensation, patient denies vertigo. She also endorses a right-sided headache with photophobia this is typical for her prior migraine exacerbations in location, severity and associated symptoms. Patient denies any family history of early cardiac death. Pain is not reproducible she states that it is alleviated by pressure to the affected area. She denies fever, cough, history of DVT or PE, exogenous estrogen, hemoptysis, abdominal pain. Patient states that she vomited 2 times on Tuesday and had diarrhea on Wednesday. She has had no further issues with this.  Pt denies fever, rash, confusion, cervicalgia, LOC/syncope, change in vision, recent N/V, numbness, weakness, dysarthria, ataxia, thunderclap onset, exacerbation with exertion or valsalva, exacerbation in morning, SOB, abdominal pain.   Past Medical History  Diagnosis Date  . No pertinent past medical history   . Abnormal Pap smear 2011  . Yeast infection   . H/O bacterial infection   . Sickle cell trait   . Headache(784.0)     Frequent  . Migraine   . Hypertension   . H/O varicella   . Bipolar 1 disorder    Past Surgical History  Procedure Laterality Date  . Dental surgery      No family history on file. History  Substance Use Topics  . Smoking status: Never Smoker   . Smokeless tobacco: Not on file  . Alcohol Use: Yes     Comment: social   OB History   Grav Para Term Preterm Abortions TAB SAB Ect Mult Living   2 1 1  0 0 0 0 0 0 1     Review of Systems  10 systems reviewed and found to be negative, except as noted in the HPI.  Allergies  Review of patient's allergies indicates no known allergies.  Home Medications   Prior to Admission medications   Medication Sig Start Date End Date Taking? Authorizing Provider  ALPRAZolam Prudy Feeler(XANAX) 0.5 MG tablet Take 0.5 mg by mouth 2 (two) times daily as needed for anxiety.    Historical Provider, MD  desvenlafaxine (PRISTIQ) 50 MG 24 hr tablet Take 50 mg by mouth at bedtime.    Historical Provider, MD  HYDROcodone-acetaminophen (NORCO/VICODIN) 5-325 MG per tablet Take 1 tablet by mouth every 6 (six) hours as needed for pain (Take 1 - 2 tablets every 4 - 6 hours.). 09/09/12   Hannah Muthersbaugh, PA-C  lamoTRIgine (LAMICTAL) 200 MG tablet Take 200 mg by mouth daily.    Historical Provider, MD  methocarbamol (ROBAXIN) 750 MG tablet Take 1 tablet (750 mg total) by mouth 4 (four) times daily as needed (Take 1 tablet every 6 hours as needed for muscle spasms.). 09/09/12   Hannah Muthersbaugh, PA-C  naproxen (NAPROSYN) 500 MG tablet Take 1 tablet (500 mg total) by mouth 2 (  two) times daily as needed. 09/09/12   Hannah Muthersbaugh, PA-C  nortriptyline (PAMELOR) 50 MG capsule Take 50 mg by mouth at bedtime.    Historical Provider, MD   BP 132/72  Pulse 70  Temp(Src) 98.4 F (36.9 C) (Oral)  Resp 18  SpO2 99%  LMP 09/21/2013  Physical Exam  Nursing note and vitals reviewed. Constitutional: She is oriented to person, place, and time. She appears well-developed and well-nourished. No distress.  HENT:  Head: Normocephalic and atraumatic.  Mouth/Throat: Oropharynx is clear and moist.  Eyes: Conjunctivae and EOM are  normal. Pupils are equal, round, and reactive to light.  Light sensitive  Neck: Normal range of motion. Neck supple.  No midline C-spine  tenderness to palpation or step-offs appreciated. Patient has full range of motion without pain.   Cardiovascular: Normal rate, regular rhythm and intact distal pulses.   Pulmonary/Chest: Effort normal and breath sounds normal. No stridor. No respiratory distress. She has no wheezes. She has no rales. She exhibits no tenderness.  Abdominal: Soft. Bowel sounds are normal. She exhibits no distension and no mass. There is no tenderness. There is no rebound and no guarding.  Musculoskeletal: Normal range of motion.  Neurological: She is alert and oriented to person, place, and time.  Follows commands, Clear, goal oriented speech, Strength is 5 out of 5x4 extremities, patient ambulates with a coordinated in nonantalgic gait. Sensation is grossly intact.   Skin: Skin is warm.  Psychiatric: She has a normal mood and affect.    ED Course  Procedures (including critical care time) Labs Review Labs Reviewed  CBC WITH DIFFERENTIAL - Abnormal; Notable for the following:    WBC 3.8 (*)    Hemoglobin 11.3 (*)    HCT 35.7 (*)    All other components within normal limits  HCG, SERUM, QUALITATIVE  BASIC METABOLIC PANEL  D-DIMER, QUANTITATIVE  I-STAT TROPOININ, ED    Imaging Review Dg Chest 2 View  09/22/2013   CLINICAL DATA:  CHEST PAIN  EXAM: CHEST  2 VIEW  COMPARISON:  None.  FINDINGS: The heart size and mediastinal contours are within normal limits. Both lungs are clear. The visualized skeletal structures are unremarkable.  IMPRESSION: No active cardiopulmonary disease.   Electronically Signed   By: Salome Holmes M.D.   On: 09/22/2013 12:16     EKG Interpretation None     Normal sinus rhythm at 70 beats per minute, borderline right axis deviation with normal intervals and no ST-T wave changes. No prior for comparison.  MDM   Final diagnoses:   Atypical chest pain    Filed Vitals:   09/22/13 1107 09/22/13 1222 09/22/13 1352  BP: 123/77 136/75 132/72  Pulse:  68 70  Temp: 98.4 F (36.9 C)    TempSrc: Oral    Resp: 26 18 18   SpO2: 97% 100% 99%    Medications  prochlorperazine (COMPAZINE) injection 10 mg (10 mg Intravenous Given 09/22/13 1216)  diphenhydrAMINE (BENADRYL) injection 25 mg (25 mg Intravenous Given 09/22/13 1216)  dexamethasone (DECADRON) injection 10 mg (10 mg Intravenous Given 09/22/13 1217)  sodium chloride 0.9 % bolus 1,000 mL (0 mLs Intravenous Stopped 09/22/13 1352)  ketorolac (TORADOL) 30 MG/ML injection 15 mg (15 mg Intravenous Given 09/22/13 1351)    Diana Roman is a 28 y.o. female presenting with right anterior chest pain. EKG is nonischemic, with no arrhythmia or other abnormality. Patient with no cardiac risk factors or family history of early cardiac death. D-dimer is negative.  Chest x-ray also with no abnormalities. Patient reports moderate improvement after headache cocktail, we'll add on Toradol. Patient given Robaxin at DC. Advised to follow closely with primary care. Patient verbalized understanding.  Evaluation does not show pathology that would require ongoing emergent intervention or inpatient treatment. Pt is hemodynamically stable and mentating appropriately. Discussed findings and plan with patient/guardian, who agrees with care plan. All questions answered. Return precautions discussed and outpatient follow up given.   Discharge Medication List as of 09/22/2013  1:25 PM    START taking these medications   Details  !! methocarbamol (ROBAXIN) 500 MG tablet Take 1 tablet (500 mg total) by mouth 2 (two) times daily as needed for muscle spasms., Starting 09/22/2013, Until Discontinued, Print     !! - Potential duplicate medications found. Please discuss with provider.      Note: Portions of this report may have been transcribed using voice recognition software. Every effort was made to ensure  accuracy; however, inadvertent computerized transcription errors may be present     Diana Emeryicole Marquarius Lofton, PA-C 09/22/13 1519

## 2013-09-22 NOTE — ED Notes (Signed)
Pt transported to xray 

## 2013-09-22 NOTE — Discharge Instructions (Signed)
°  Chest Pain (Nonspecific) Chest pain has many causes. Your pain could be caused by something serious, such as a heart attack or a blood clot in the lungs. It could also be caused by something less serious, such as a chest bruise or a virus. Follow up with your doctor. More lab tests or other studies may be needed to find the cause of your pain. Most of the time, nonspecific chest pain will improve within 2 to 3 days of rest and mild pain medicine. HOME CARE  For chest bruises, you may put ice on the sore area for 15-20 minutes, 03-04 times a day. Do this only if it makes you feel better.  Put ice in a plastic bag.  Place a towel between the skin and the bag.  Rest for the next 2 to 3 days.  Go back to work if the pain improves.  See your doctor if the pain lasts longer than 1 to 2 weeks.  Only take medicine as told by your doctor.  Quit smoking if you smoke. GET HELP RIGHT AWAY IF:   There is more pain or pain that spreads to the arm, neck, jaw, back, or belly (abdomen).  You have shortness of breath.  You cough more than usual or cough up blood.  You have very bad back or belly pain, feel sick to your stomach (nauseous), or throw up (vomit).  You have very bad weakness.  You pass out (faint).  You have a fever. Any of these problems may be serious and may be an emergency. Do not wait to see if the problems will go away. Get medical help right away. Call your local emergency services 911 in [place] Do not drive yourself to the hospital. MAKE SURE YOU:   Understand these instructions.  Will watch this condition.  Will get help right away if you or your child is not doing well or gets worse. Document Released: 10/20/2007 Document Revised: 07/26/2011 Document Reviewed: 10/20/2007 [facility] Patient Information 2014 [place], [place]. For pain control you may take up to 800mg  of Motrin (also known as ibuprofen). That is usually 4 over the counter pills,  3 times a day. Take with  food to minimize stomach irritation   You can also take  tylenol (acetaminophen) 975mg  (this is 3 over the counter pills) four times a day. Do not drink alcohol or combine with other medications that have acetaminophen as an ingredient (Read the labels!).    For breakthrough pain you may take Robaxin. Do not drink alcohol, drive or operate heavy machinery when taking Robaxin.  Do not hesitate to return to the Emergency Department for any new, worsening or concerning symptoms.   If you do not have a primary care doctor you can establish one at the   [facility]: [address] [place] [id number] [phone]  After you establish care. Let them know you were seen in the emergency room. They must obtain records for further management.

## 2014-03-18 ENCOUNTER — Encounter (HOSPITAL_COMMUNITY): Payer: Self-pay | Admitting: Emergency Medicine

## 2014-10-17 ENCOUNTER — Emergency Department (HOSPITAL_BASED_OUTPATIENT_CLINIC_OR_DEPARTMENT_OTHER)
Admission: EM | Admit: 2014-10-17 | Discharge: 2014-10-17 | Disposition: A | Discharge status: Home / Self Care | Payer: BLUE CROSS/BLUE SHIELD | Attending: Emergency Medicine | Admitting: Emergency Medicine

## 2014-10-17 DIAGNOSIS — I10 Essential (primary) hypertension: Secondary | ICD-10-CM | POA: Diagnosis not present

## 2014-10-17 DIAGNOSIS — Z8619 Personal history of other infectious and parasitic diseases: Secondary | ICD-10-CM | POA: Diagnosis not present

## 2014-10-17 DIAGNOSIS — G43011 Migraine without aura, intractable, with status migrainosus: Secondary | ICD-10-CM | POA: Diagnosis not present

## 2014-10-17 DIAGNOSIS — M542 Cervicalgia: Secondary | ICD-10-CM | POA: Diagnosis not present

## 2014-10-17 DIAGNOSIS — M549 Dorsalgia, unspecified: Secondary | ICD-10-CM | POA: Insufficient documentation

## 2014-10-17 DIAGNOSIS — Z862 Personal history of diseases of the blood and blood-forming organs and certain disorders involving the immune mechanism: Secondary | ICD-10-CM | POA: Insufficient documentation

## 2014-10-17 DIAGNOSIS — G43911 Migraine, unspecified, intractable, with status migrainosus: Secondary | ICD-10-CM

## 2014-10-17 DIAGNOSIS — Z79899 Other long term (current) drug therapy: Secondary | ICD-10-CM | POA: Insufficient documentation

## 2014-10-17 DIAGNOSIS — F319 Bipolar disorder, unspecified: Secondary | ICD-10-CM | POA: Diagnosis not present

## 2014-10-17 DIAGNOSIS — G43909 Migraine, unspecified, not intractable, without status migrainosus: Secondary | ICD-10-CM | POA: Diagnosis present

## 2014-10-17 MED ORDER — DIPHENHYDRAMINE HCL 50 MG/ML IJ SOLN
25.0000 mg | Freq: Once | INTRAMUSCULAR | Status: AC
  Administered 2014-10-17: 25 mg via INTRAVENOUS
  Filled 2014-10-17: qty 1

## 2014-10-17 MED ORDER — DEXAMETHASONE SODIUM PHOSPHATE 10 MG/ML IJ SOLN
10.0000 mg | Freq: Once | INTRAMUSCULAR | Status: AC
  Administered 2014-10-17: 10 mg via INTRAVENOUS
  Filled 2014-10-17: qty 1

## 2014-10-17 MED ORDER — PROMETHAZINE HCL 25 MG/ML IJ SOLN
12.5000 mg | Freq: Once | INTRAMUSCULAR | Status: AC
  Administered 2014-10-17: 12.5 mg via INTRAVENOUS
  Filled 2014-10-17: qty 1

## 2014-10-17 MED ORDER — SODIUM CHLORIDE 0.9 % IV SOLN: INTRAVENOUS | Status: DC

## 2014-10-17 MED ORDER — SODIUM CHLORIDE 0.9 % IV BOLUS (SEPSIS)
1000.0000 mL | Freq: Once | INTRAVENOUS | Status: AC
  Administered 2014-10-17: 1000 mL via INTRAVENOUS

## 2014-10-17 NOTE — ED Notes (Signed)
While pt was vomiting in a emesis bag, her dentures fell out.  Pt reports that she will remove the teeth from the bag herself.

## 2014-10-17 NOTE — ED Notes (Signed)
Pt reports migraine intermittently x 3 days.  N/V that started today.

## 2014-10-17 NOTE — Discharge Instructions (Signed)
Migraine Headache A migraine headache is very bad, throbbing pain on one or both sides of your head. Talk to your doctor about what things may bring on (trigger) your migraine headaches. HOME CARE  Only take medicines as told by your doctor.  Lie down in a dark, quiet room when you have a migraine.  Keep a journal to find out if certain things bring on migraine headaches. For example, write down:  What you eat and drink.  How much sleep you get.  Any change to your diet or medicines.  Lessen how much alcohol you drink.  Quit smoking if you smoke.  Get enough sleep.  Lessen any stress in your life.  Keep lights dim if bright lights bother you or make your migraines worse. GET HELP RIGHT AWAY IF:   Your migraine becomes really bad.  You have a fever.  You have a stiff neck.  You have trouble seeing.  Your muscles are weak, or you lose muscle control.  You lose your balance or have trouble walking.  You feel like you will pass out (faint), or you pass out.  You have really bad symptoms that are different than your first symptoms. MAKE SURE YOU:   Understand these instructions.  Will watch your condition.  Will get help right away if you are not doing well or get worse. Document Released: 02/10/2008 Document Revised: 07/26/2011 Document Reviewed: 01/08/2013 [facility] LPExitCare Patient Information 2015 [place], [place]. This information is not intended to replace advice given to you by your health care provider. Make sure you discuss any questions you have with your health care provider.   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 Return for any new or worse symptoms. Rest for the next 24 hours.

## 2014-10-17 NOTE — ED Provider Notes (Signed)
CSN: [id number]     Arrival date & time 10/17/14  1904 History  This chart was scribed for Vanetta MuldersScott Norvell Caswell, MD by Elon SpannerGarrett Cook, ED Scribe. This patient was seen in room MH01/MH01 and the patient's care was started at 8:40  PM.   Chief Complaint  Patient presents with  . Migraine   Patient is a 29 y.o. female presenting with migraines. The history is provided by the patient. No language interpreter was used.  Migraine This is a recurrent problem. The current episode started more than 2 days ago. Associated symptoms include headaches. Pertinent negatives include no chest pain, no abdominal pain and no shortness of breath. Nothing relieves the symptoms.   HPI Comments: Diana Roman is a 29 y.o. female who presents to the Emergency Department complaining of a 8/10 left-side forehead headache described as sharp/shooting onset 3 days ago.  She reports associated nausea, vomiting, photophobia.  She reports a history of previous similar episodes.  She denies fever.     Past Medical History  Diagnosis Date  . No pertinent past medical history   . Abnormal Pap smear 2011  . Yeast infection   . H/O bacterial infection   . Sickle cell trait   . Headache(784.0)     Frequent  . Migraine   . Hypertension   . H/O varicella   . Bipolar 1 disorder    Past Surgical History  Procedure Laterality Date  . Dental surgery     No family history on file. History  Substance Use Topics  . Smoking status: Never Smoker   . Smokeless tobacco: Not on file  . Alcohol Use: Yes     Comment: social   OB History    Gravida Para Term Preterm AB TAB SAB Ectopic Multiple Living   2 1 1  0 0 0 0 0 0 1     Review of Systems  Constitutional: Negative for fever and chills.  HENT: Negative for rhinorrhea and sore throat.   Eyes: Positive for photophobia and visual disturbance.  Respiratory: Negative for cough and shortness of breath.   Cardiovascular: Negative for chest pain and leg swelling.  Gastrointestinal:  Positive for nausea and vomiting. Negative for abdominal pain.  Genitourinary: Negative for dysuria.  Musculoskeletal: Positive for back pain (Chronic, normal to baseline) and neck pain.  Skin: Negative for rash.  Neurological: Positive for headaches.  Hematological: Does not bruise/bleed easily.  Psychiatric/Behavioral: Negative for confusion.      Allergies  Review of patient's allergies indicates no known allergies.  Home Medications   Prior to Admission medications   Medication Sig Start Date End Date Taking? Authorizing Provider  clonazePAM (KLONOPIN) 1 MG tablet Take 1 mg by mouth 2 (two) times daily.   Yes Historical Provider, MD  lamoTRIgine (LAMICTAL) 200 MG tablet Take 200 mg by mouth daily.    Historical Provider, MD  nortriptyline (PAMELOR) 50 MG capsule Take 50 mg by mouth at bedtime.    Historical Provider, MD   BP 135/97 mmHg  Pulse 90  Temp(Src) 98.7 F (37.1 C) (Oral)  Resp 18  Ht 5\' 5"  (1.651 m)  Wt 210 lb (95.255 kg)  BMI 34.95 kg/m2  SpO2 99%  LMP 10/14/2014 Physical Exam  Constitutional: She is oriented to person, place, and time. She appears well-developed and well-nourished. No distress.  HENT:  Head: Normocephalic and atraumatic.  Mucous membranes moist.  Eyes: Conjunctivae and EOM are normal.  Pupils normal.  Sclera clear.  Eyes track normal.  Neck: Neck supple. No tracheal deviation present.  Cardiovascular: Normal rate and regular rhythm.   No murmur heard. Pulmonary/Chest: Effort normal. No respiratory distress.  Lungs clear bilaterally.    Abdominal: Bowel sounds are normal. There is no tenderness.  Musculoskeletal: Normal range of motion.  No swelling in the ankles  Neurological: She is alert and oriented to person, place, and time. No cranial nerve deficit. She exhibits normal muscle tone. Coordination normal.  Skin: Skin is warm and dry.  Psychiatric: She has a normal mood and affect. Her behavior is normal.  Nursing note and vitals  reviewed.   ED Course  Procedures (including critical care time)  DIAGNOSTIC STUDIES: Oxygen Saturation is 99% on RA, normal by my interpretation.    COORDINATION OF CARE:  8:45 PM Discussed treatment plan with patient at bedside.  Patient acknowledges and agrees with plan.    Labs Review Labs Reviewed - No data to display  Imaging Review No results found.   EKG Interpretation None      MDM   Final diagnoses:  Intractable migraine with status migrainosus, unspecified migraine type    Patient treated with migraine cocktail and IV fluids. Patient's symptoms seem to be consistent with migraine. History of headaches that normally start on the left frontal areas like today's. Patient with some improvement with the treatment here. Patient will be discharged home. Nontoxic no acute distress no focal neuro deficits.  I personally performed the services described in this documentation, which was scribed in my presence. The recorded information has been reviewed and is accurate.     Vanetta Mulders, MD 10/17/14 2208

## 2014-11-25 ENCOUNTER — Emergency Department (HOSPITAL_BASED_OUTPATIENT_CLINIC_OR_DEPARTMENT_OTHER)
Admission: EM | Admit: 2014-11-25 | Discharge: 2014-11-25 | Disposition: A | Discharge status: Home / Self Care | Payer: BLUE CROSS/BLUE SHIELD | Attending: Emergency Medicine | Admitting: Emergency Medicine

## 2014-11-25 ENCOUNTER — Encounter (HOSPITAL_BASED_OUTPATIENT_CLINIC_OR_DEPARTMENT_OTHER): Payer: Self-pay

## 2014-11-25 DIAGNOSIS — Z862 Personal history of diseases of the blood and blood-forming organs and certain disorders involving the immune mechanism: Secondary | ICD-10-CM | POA: Insufficient documentation

## 2014-11-25 DIAGNOSIS — N390 Urinary tract infection, site not specified: Secondary | ICD-10-CM | POA: Diagnosis not present

## 2014-11-25 DIAGNOSIS — F319 Bipolar disorder, unspecified: Secondary | ICD-10-CM | POA: Diagnosis not present

## 2014-11-25 DIAGNOSIS — G43909 Migraine, unspecified, not intractable, without status migrainosus: Secondary | ICD-10-CM | POA: Diagnosis not present

## 2014-11-25 DIAGNOSIS — Z8619 Personal history of other infectious and parasitic diseases: Secondary | ICD-10-CM | POA: Diagnosis not present

## 2014-11-25 DIAGNOSIS — I10 Essential (primary) hypertension: Secondary | ICD-10-CM | POA: Insufficient documentation

## 2014-11-25 DIAGNOSIS — Z3202 Encounter for pregnancy test, result negative: Secondary | ICD-10-CM | POA: Diagnosis not present

## 2014-11-25 DIAGNOSIS — M545 Low back pain, unspecified: Secondary | ICD-10-CM

## 2014-11-25 DIAGNOSIS — Z79899 Other long term (current) drug therapy: Secondary | ICD-10-CM | POA: Insufficient documentation

## 2014-11-25 LAB — URINALYSIS, ROUTINE W REFLEX MICROSCOPIC
Bilirubin Urine: NEGATIVE
Glucose, UA: NEGATIVE mg/dL
Hgb urine dipstick: NEGATIVE
KETONES UR: NEGATIVE mg/dL
Nitrite: NEGATIVE
Protein, ur: NEGATIVE mg/dL
SPECIFIC GRAVITY, URINE: 1.024 (ref 1.005–1.030)
Urobilinogen, UA: 1 mg/dL (ref 0.0–1.0)
pH: 6 (ref 5.0–8.0)

## 2014-11-25 LAB — URINE MICROSCOPIC-ADD ON

## 2014-11-25 LAB — PREGNANCY, URINE: Preg Test, Ur: NEGATIVE

## 2014-11-25 MED ORDER — ACETAMINOPHEN 325 MG PO TABS
650.0000 mg | ORAL_TABLET | Freq: Once | ORAL | Status: AC
  Administered 2014-11-25: 650 mg via ORAL
  Filled 2014-11-25: qty 2

## 2014-11-25 MED ORDER — METHOCARBAMOL 500 MG PO TABS: 500.0000 mg | ORAL_TABLET | Freq: Two times a day (BID) | ORAL | Status: DC | PRN

## 2014-11-25 MED ORDER — NITROFURANTOIN MONOHYD MACRO 100 MG PO CAPS: 100.0000 mg | ORAL_CAPSULE | Freq: Two times a day (BID) | ORAL | Status: DC

## 2014-11-25 MED ORDER — NAPROXEN 250 MG PO TABS: 250.0000 mg | ORAL_TABLET | Freq: Two times a day (BID) | ORAL | Status: DC

## 2014-11-25 NOTE — Discharge Instructions (Signed)
Back Pain, Adult Low back pain is very common. About 1 in 5 people have back pain.The cause of low back pain is rarely dangerous. The pain often gets better over time.About half of people with a sudden onset of back pain feel better in just 2 weeks. About 8 in 10 people feel better by 6 weeks.  CAUSES Some common causes of back pain include:  Strain of the muscles or ligaments supporting the spine.  Wear and tear (degeneration) of the spinal discs.  Arthritis.  Direct injury to the back. DIAGNOSIS Most of the time, the direct cause of low back pain is not known.However, back pain can be treated effectively even when the exact cause of the pain is unknown.Answering your caregiver's questions about your overall health and symptoms is one of the most accurate ways to make sure the cause of your pain is not dangerous. If your caregiver needs more information, he or she may order lab work or imaging tests (X-rays or MRIs).However, even if imaging tests show changes in your back, this usually does not require surgery. HOME CARE INSTRUCTIONS For many people, back pain returns.Since low back pain is rarely dangerous, it is often a condition that people can learn to [facility] their own.   Remain active. It is stressful on the back to sit or stand in one place. Do not sit, drive, or stand in one place for more than 30 minutes at a time. Take short walks on level surfaces as soon as pain allows.Try to increase the length of time you walk each day.  Do not stay in bed.Resting more than 1 or 2 days can delay your recovery.  Do not avoid exercise or work.Your body is made to move.It is not dangerous to be active, even though your back may hurt.Your back will likely heal faster if you return to being active before your pain is gone.  Pay attention to your body when you bend and lift. Many people have less discomfortwhen lifting if they bend their knees, keep the load close to their bodies,and  avoid twisting. Often, the most comfortable positions are those that put less stress on your recovering back.  Find a comfortable position to sleep. Use a firm mattress and lie on your side with your knees slightly bent. If you lie on your back, put a pillow under your knees.  Only take over-the-counter or prescription medicines as directed by your caregiver. Over-the-counter medicines to reduce pain and inflammation are often the most helpful.Your caregiver may prescribe muscle relaxant drugs.These medicines help dull your pain so you can more quickly return to your normal activities and healthy exercise.  Put ice on the injured area.  Put ice in a plastic bag.  Place a towel between your skin and the bag.  Leave the ice on for 15-20 minutes, 03-04 times a day for the first 2 to 3 days. After that, ice and heat may be alternated to reduce pain and spasms.  Ask your caregiver about trying back exercises and gentle massage. This may be of some benefit.  Avoid feeling anxious or stressed.Stress increases muscle tension and can worsen back pain.It is important to recognize when you are anxious or stressed and learn ways to manage it.Exercise is a great option. SEEK MEDICAL CARE IF:  You have pain that is not relieved with rest or medicine.  You have pain that does not improve in 1 week.  You have new symptoms.  You are generally not feeling well. SEEK  IMMEDIATE MEDICAL CARE IF:   You have pain that radiates from your back into your legs.  You develop new bowel or bladder control problems.  You have unusual weakness or numbness in your arms or legs.  You develop nausea or vomiting.  You develop abdominal pain.  You feel faint. Document Released: 05/03/2005 Document Revised: 11/02/2011 Document Reviewed: 09/04/2013 [facility] Patient Information 2015 [place], [place]. This information is not intended to replace advice given to you by your health care provider. Make sure you  discuss any questions you have with your health care provider. Back Exercises Back exercises help treat and prevent back injuries. The goal of back exercises is to increase the strength of your abdominal and back muscles and the flexibility of your back. These exercises should be started when you no longer have back pain. Back exercises include:  Pelvic Tilt. Lie on your back with your knees bent. Tilt your pelvis until the lower part of your back is against the floor. Hold this position 5 to 10 sec and repeat 5 to 10 times.  Knee to Chest. Pull first 1 knee up against your chest and hold for 20 to 30 seconds, repeat this with the other knee, and then both knees. This may be done with the other leg straight or bent, whichever feels better.  Sit-Ups or Curl-Ups. Bend your knees 90 degrees. Start with tilting your pelvis, and do a partial, slow sit-up, lifting your trunk only 30 to 45 degrees off the floor. Take at least 2 to 3 seconds for each sit-up. Do not do sit-ups with your knees out straight. If partial sit-ups are difficult, simply do the above but with only tightening your abdominal muscles and holding it as directed.  Hip-Lift. Lie on your back with your knees flexed 90 degrees. Push down with your feet and shoulders as you raise your hips a couple inches off the floor; hold for 10 seconds, repeat 5 to 10 times.  Back arches. Lie on your stomach, propping yourself up on bent elbows. Slowly press on your hands, causing an arch in your low back. Repeat 3 to 5 times. Any initial stiffness and discomfort should lessen with repetition over time.  Shoulder-Lifts. Lie face down with arms beside your body. Keep hips and torso pressed to floor as you slowly lift your head and shoulders off the floor. Do not overdo your exercises, especially in the beginning. Exercises may cause you some mild back discomfort which lasts for a few minutes; however, if the pain is more severe, or lasts for more than 15  minutes, do not continue exercises until you see your caregiver. Improvement with exercise therapy for back problems is slow.  See your caregivers for assistance with developing a proper back exercise program. Document Released: 06/10/2004 Document Revised: 07/26/2011 Document Reviewed: 03/04/2011 [facility] Patient Information 2015 [place], [place]. This information is not intended to replace advice given to you by your health care provider. Make sure you discuss any questions you have with your health care provider. Urinary Tract Infection Urinary tract infections (UTIs) can develop anywhere along your urinary tract. Your urinary tract is your body's drainage system for removing wastes and extra water. Your urinary tract includes two kidneys, two ureters, a bladder, and a urethra. Your kidneys are a pair of bean-shaped organs. Each kidney is about the size of your fist. They are located below your ribs, one on each side of your spine. CAUSES Infections are caused by microbes, which are microscopic organisms, including  fungi, viruses, and bacteria. These organisms are so small that they can only be seen through a microscope. Bacteria are the microbes that most commonly cause UTIs. SYMPTOMS  Symptoms of UTIs may vary by age and gender of the patient and by the location of the infection. Symptoms in young women typically include a frequent and intense urge to urinate and a painful, burning feeling in the bladder or urethra during urination. Older women and men are more likely to be tired, shaky, and weak and have muscle aches and abdominal pain. A fever may mean the infection is in your kidneys. Other symptoms of a kidney infection include pain in your back or sides below the ribs, nausea, and vomiting. DIAGNOSIS To diagnose a UTI, your caregiver will ask you about your symptoms. Your caregiver also will ask to provide a urine sample. The urine sample will be tested for bacteria and white blood cells. White  blood cells are made by your body to help fight infection. TREATMENT  Typically, UTIs can be treated with medication. Because most UTIs are caused by a bacterial infection, they usually can be treated with the use of antibiotics. The choice of antibiotic and length of treatment depend on your symptoms and the type of bacteria causing your infection. HOME CARE INSTRUCTIONS  If you were prescribed antibiotics, take them exactly as your caregiver instructs you. Finish the medication even if you feel better after you have only taken some of the medication.  Drink enough water and fluids to keep your urine clear or pale yellow.  Avoid caffeine, tea, and carbonated beverages. They tend to irritate your bladder.  Empty your bladder often. Avoid holding urine for long periods of time.  Empty your bladder before and after sexual intercourse.  After a bowel movement, women should cleanse from front to back. Use each tissue only once. SEEK MEDICAL CARE IF:   You have back pain.  You develop a fever.  Your symptoms do not begin to resolve within 3 days. SEEK IMMEDIATE MEDICAL CARE IF:   You have severe back pain or lower abdominal pain.  You develop chills.  You have nausea or vomiting.  You have continued burning or discomfort with urination. MAKE SURE YOU:   Understand these instructions.  Will watch your condition.  Will get help right away if you are not doing well or get worse. Document Released: 02/10/2005 Document Revised: 11/02/2011 Document Reviewed: 06/11/2011 [facility] Patient Information 2015 [place]. This information is not intended to replace advice given to you by your health care provider. Make sure you discuss any questions you have with your health care provider.

## 2014-11-25 NOTE — ED Notes (Signed)
C/o "back spasms"-started last night-steady gait in heels

## 2014-11-25 NOTE — ED Notes (Signed)
Report received from Joss, RN, care assumed

## 2014-11-25 NOTE — ED Provider Notes (Signed)
CSN: [id number]     Arrival date & time 11/25/14  1311 History   First MD Initiated Contact with Patient 11/25/14 1344     Chief Complaint  Patient presents with  . Back Pain   Diana Roman is a 29 y.o. female with a history of bipolar disorder who presents to the emergency department complaining of right low back spasm since yesterday. Patient reports she has had right low back spasm since last night that is worse today. She reports she feels like her right low back is spasm is tight. She went of 7 out of 10 pain that is worse with movement. She reports that she has had pain intermittently radiate into her abdomen but she denies any current abdominal pain. Patient denies any injury or fall to her back. She does report she was lifting boxes yesterday but does not believe she injured while lifting these boxes. Patient reports taking ibuprofen 40 mg at 8 AM this morning with some relief. The patient denies fevers, chills, falls, numbness, tingling, weakness, abdominal pain, nausea, vomiting, chest pain, shortness of breath, loss of bladder control, loss of bowel control, difficulty urinating, dysuria, urinary frequency, urinary urgency, hematuria, history of cancer, or history of IV drug use.  (Consider location/radiation/quality/duration/timing/severity/associated sxs/prior Treatment) HPI  Past Medical History  Diagnosis Date  . No pertinent past medical history   . Abnormal Pap smear 2011  . Yeast infection   . H/O bacterial infection   . Sickle cell trait   . Headache(784.0)     Frequent  . Migraine   . Hypertension   . H/O varicella   . Bipolar 1 disorder    Past Surgical History  Procedure Laterality Date  . Dental surgery     No family history on file. History  Substance Use Topics  . Smoking status: Never Smoker   . Smokeless tobacco: Not on file  . Alcohol Use: Yes     Comment: social   OB History    Gravida Para Term Preterm AB TAB SAB Ectopic Multiple Living   2 1 1  0  0 0 0 0 0 1     Review of Systems  Constitutional: Negative for fever and chills.  Eyes: Negative for visual disturbance.  Respiratory: Negative for cough and shortness of breath.   Cardiovascular: Negative for chest pain.  Gastrointestinal: Negative for nausea, vomiting, abdominal pain and diarrhea.  Genitourinary: Negative for dysuria, urgency, frequency, hematuria, flank pain, decreased urine volume, vaginal bleeding, vaginal discharge, difficulty urinating and genital sores.  Musculoskeletal: Positive for back pain. Negative for gait problem and neck pain.  Skin: Negative for rash and wound.  Neurological: Negative for weakness, light-headedness, numbness and headaches.      Allergies  Review of patient's allergies indicates no known allergies.  Home Medications   Prior to Admission medications   Medication Sig Start Date End Date Taking? Authorizing Provider  clonazePAM (KLONOPIN) 1 MG tablet Take 1 mg by mouth 2 (two) times daily.    Historical Provider, MD  lamoTRIgine (LAMICTAL) 200 MG tablet Take 200 mg by mouth daily.    Historical Provider, MD  methocarbamol (ROBAXIN) 500 MG tablet Take 1 tablet (500 mg total) by mouth 2 (two) times daily as needed for muscle spasms. 11/25/14   Everlene FarrierWilliam Monzerrat Wellen, PA-C  naproxen (NAPROSYN) 250 MG tablet Take 1 tablet (250 mg total) by mouth 2 (two) times daily with a meal. 11/25/14   Everlene FarrierWilliam Gilliam Hawkes, PA-C  nitrofurantoin, macrocrystal-monohydrate, (MACROBID) 100 MG capsule  Take 1 capsule (100 mg total) by mouth 2 (two) times daily. 11/25/14   Everlene Farrier, PA-C  nortriptyline (PAMELOR) 50 MG capsule Take 50 mg by mouth at bedtime.    Historical Provider, MD   BP 133/90 mmHg  Pulse 92  Temp(Src) 98.4 F (36.9 C) (Oral)  Resp 16  Ht 5\' 6"  (1.676 m)  Wt 210 lb (95.255 kg)  BMI 33.91 kg/m2  SpO2 100%  LMP 10/24/2014 Physical Exam  Constitutional: She is oriented to person, place, and time. She appears well-developed and well-nourished. No  distress.  Nontoxic appearing.  HENT:  Head: Normocephalic and atraumatic.  Mouth/Throat: Oropharynx is clear and moist.  Eyes: Conjunctivae are normal. Pupils are equal, round, and reactive to light. Right eye exhibits no discharge. Left eye exhibits no discharge.  Neck: Normal range of motion. Neck supple.  No midline neck tenderness.  Cardiovascular: Normal rate, regular rhythm, normal heart sounds and intact distal pulses.   Pulmonary/Chest: Effort normal and breath sounds normal. No respiratory distress. She has no wheezes. She has no rales.  Lungs are clear to auscultation bilaterally.  Abdominal: Soft. Bowel sounds are normal. She exhibits no distension and no mass. There is no tenderness. There is no rebound and no guarding.  Abdomen is soft and nontender to palpation. Bowel sounds are present. Negative psoas and obturator sign. No CVA tenderness.  Musculoskeletal: She exhibits tenderness. She exhibits no edema.  Patient's right low back is tender to palpation to the right of the midline. Her musculature feels to be in spasm. There is no CVA tenderness. No midline back tenderness. No back edema, erythema, ecchymosis or deformity noted. Patient's strength is 5 out of 5 in her bilateral lower extremities. The patient is able to ambulate without difficulty or assistance.  Lymphadenopathy:    She has no cervical adenopathy.  Neurological: She is alert and oriented to person, place, and time. She has normal reflexes. She displays normal reflexes. Coordination normal.  Bilateral patellar DTRs are intact. Sensation is intact her bilateral lower extremities.  Skin: Skin is warm and dry. No rash noted. She is not diaphoretic. No erythema. No pallor.  Psychiatric: She has a normal mood and affect. Her behavior is normal.  Nursing note and vitals reviewed.   ED Course  Procedures (including critical care time) Labs Review Labs Reviewed  URINALYSIS, ROUTINE W REFLEX MICROSCOPIC (NOT AT [facility])  - Abnormal; Notable for the following:    APPearance CLOUDY (*)    Leukocytes, UA MODERATE (*)    All other components within normal limits  URINE MICROSCOPIC-ADD ON - Abnormal; Notable for the following:    Bacteria, UA MANY (*)    All other components within normal limits  URINE CULTURE  PREGNANCY, URINE    Imaging Review No results found.   EKG Interpretation None      Filed Vitals:   11/25/14 1318  BP: 133/90  Pulse: 92  Temp: 98.4 F (36.9 C)  TempSrc: Oral  Resp: 16  Height: 5\' 6"  (1.676 m)  Weight: 210 lb (95.255 kg)  SpO2: 100%     MDM   Meds given in ED:  Medications  acetaminophen (TYLENOL) tablet 650 mg (650 mg Oral Given 11/25/14 1429)    Discharge Medication List as of 11/25/2014  2:23 PM    START taking these medications   Details  methocarbamol (ROBAXIN) 500 MG tablet Take 1 tablet (500 mg total) by mouth 2 (two) times daily as needed for muscle spasms., Starting 11/25/2014,  Until Discontinued, Print    naproxen (NAPROSYN) 250 MG tablet Take 1 tablet (250 mg total) by mouth 2 (two) times daily with a meal., Starting 11/25/2014, Until Discontinued, Print    nitrofurantoin, macrocrystal-monohydrate, (MACROBID) 100 MG capsule Take 1 capsule (100 mg total) by mouth 2 (two) times daily., Starting 11/25/2014, Until Discontinued, Print        Final diagnoses:  Right-sided low back pain without sciatica  UTI (lower urinary tract infection)     Patient with right low back spasm.  No neurological deficits and normal neuro exam.  Patient can walk but states is painful.  No loss of bowel or bladder control.  No concern for cauda equina.  No fever, night sweats, weight loss, h/o cancer, IVDU.  Patient's abdomen is soft and nontender to palpation. She is a negative urine pregnancy test. Urinalysis indicated moderate leukocytes and many bacteria. The patient denies any urinary symptoms, vaginal bleeding or vaginal discharge. Patient has no CVA tenderness. Is a  patient is of moderate leukocytes and many bacteria will send her urine for culture and treat her for UTI with Macrobid. We'll treat the patient's back pain with naproxen and Robaxin. The patient denies she is driving today needs to return to work so we will give her Tylenol here in the ED. She declined a shot of toradol. I advised the patient to follow-up with their primary care provider this week. I advised the patient to return to the emergency department with new or worsening symptoms or new concerns. The patient verbalized understanding and agreement with plan.    This patient was discussed with Dr. Donnald Garre who agrees with assessment and plan.  Everlene Farrier, PA-C 11/25/14 1438  Arby Barrette, MD 11/26/14 [phone]

## 2014-11-27 ENCOUNTER — Inpatient Hospital Stay (HOSPITAL_COMMUNITY): Payer: BLUE CROSS/BLUE SHIELD

## 2014-11-27 ENCOUNTER — Encounter (HOSPITAL_COMMUNITY): Payer: Self-pay | Admitting: *Deleted

## 2014-11-27 ENCOUNTER — Inpatient Hospital Stay (HOSPITAL_COMMUNITY)
Admission: AD | Admit: 2014-11-27 | Discharge: 2014-11-27 | Disposition: A | Discharge status: Home / Self Care | Payer: BLUE CROSS/BLUE SHIELD | Source: Ambulatory Visit | Attending: Obstetrics and Gynecology | Admitting: Obstetrics and Gynecology

## 2014-11-27 DIAGNOSIS — R1032 Left lower quadrant pain: Secondary | ICD-10-CM | POA: Insufficient documentation

## 2014-11-27 DIAGNOSIS — K5900 Constipation, unspecified: Secondary | ICD-10-CM | POA: Insufficient documentation

## 2014-11-27 DIAGNOSIS — F329 Major depressive disorder, single episode, unspecified: Secondary | ICD-10-CM | POA: Insufficient documentation

## 2014-11-27 DIAGNOSIS — R109 Unspecified abdominal pain: Secondary | ICD-10-CM | POA: Diagnosis present

## 2014-11-27 HISTORY — DX: Depression, unspecified: F32.A

## 2014-11-27 HISTORY — DX: Anxiety disorder, unspecified: F41.9

## 2014-11-27 HISTORY — DX: Major depressive disorder, single episode, unspecified: F32.9

## 2014-11-27 HISTORY — DX: Unspecified infectious disease: B99.9

## 2014-11-27 HISTORY — DX: Unspecified abnormal cytological findings in specimens from vagina: R87.629

## 2014-11-27 LAB — URINE CULTURE

## 2014-11-27 LAB — URINALYSIS, ROUTINE W REFLEX MICROSCOPIC
BILIRUBIN URINE: NEGATIVE
GLUCOSE, UA: NEGATIVE mg/dL
HGB URINE DIPSTICK: NEGATIVE
Ketones, ur: NEGATIVE mg/dL
Leukocytes, UA: NEGATIVE
NITRITE: NEGATIVE
PROTEIN: NEGATIVE mg/dL
Specific Gravity, Urine: >1.03 — ABNORMAL HIGH (ref 1.005–1.030)
UROBILINOGEN UA: 0.2 mg/dL (ref 0.0–1.0)
pH: 6 (ref 5.0–8.0)

## 2014-11-27 LAB — POCT PREGNANCY, URINE: Preg Test, Ur: NEGATIVE

## 2014-11-27 MED ORDER — IBUPROFEN 800 MG PO TABS: 800.0000 mg | ORAL_TABLET | Freq: Three times a day (TID) | ORAL | Status: DC | PRN

## 2014-11-27 MED ORDER — IBUPROFEN 800 MG PO TABS
800.0000 mg | ORAL_TABLET | Freq: Once | ORAL | Status: AC
  Administered 2014-11-27: 800 mg via ORAL
  Filled 2014-11-27: qty 1

## 2014-11-27 NOTE — Discharge Instructions (Signed)
Abdominal Pain, Women °Abdominal (stomach, pelvic, or belly) pain can be caused by many things. It is important to tell your doctor: °· The location of the pain. °· Does it come and go or is it present all the time? °· Are there things that start the pain (eating certain foods, exercise)? °· Are there other symptoms associated with the pain (fever, nausea, vomiting, diarrhea)? °All of this is helpful to know when trying to find the cause of the pain. °CAUSES  °· Stomach: virus or bacteria infection, or ulcer. °· Intestine: appendicitis (inflamed appendix), regional ileitis (Crohn's disease), ulcerative colitis (inflamed colon), irritable bowel syndrome, diverticulitis (inflamed diverticulum of the colon), or cancer of the stomach or intestine. °· Gallbladder disease or stones in the gallbladder. °· Kidney disease, kidney stones, or infection. °· Pancreas infection or cancer. °· Fibromyalgia (pain disorder). °· Diseases of the female organs: °¨ Uterus: fibroid (non-cancerous) tumors or infection. °¨ Fallopian tubes: infection or tubal pregnancy. °¨ Ovary: cysts or tumors. °¨ Pelvic adhesions (scar tissue). °¨ Endometriosis (uterus lining tissue growing in the pelvis and on the pelvic organs). °¨ Pelvic congestion syndrome (female organs filling up with blood just before the menstrual period). °¨ Pain with the menstrual period. °¨ Pain with ovulation (producing an egg). °¨ Pain with an IUD (intrauterine device, birth control) in the uterus. °¨ Cancer of the female organs. °· Functional pain (pain not caused by a disease, may improve without treatment). °· Psychological pain. °· Depression. °DIAGNOSIS  °Your doctor will decide the seriousness of your pain by doing an examination. °· Blood tests. °· X-rays. °· Ultrasound. °· CT scan (computed tomography, special type of X-ray). °· MRI (magnetic resonance imaging). °· Cultures, for infection. °· Barium enema (dye inserted in the large intestine, to better view it with  X-rays). °· Colonoscopy (looking in intestine with a lighted tube). °· Laparoscopy (minor surgery, looking in abdomen with a lighted tube). °· Major abdominal exploratory surgery (looking in abdomen with a large incision). °TREATMENT  °The treatment will depend on the cause of the pain.  °· Many cases can be observed and treated at home. °· Over-the-counter medicines recommended by your caregiver. °· Prescription medicine. °· Antibiotics, for infection. °· Birth control pills, for painful periods or for ovulation pain. °· Hormone treatment, for endometriosis. °· Nerve blocking injections. °· Physical therapy. °· Antidepressants. °· Counseling with a psychologist or psychiatrist. °· Minor or major surgery. °HOME CARE INSTRUCTIONS  °· Do not take laxatives, unless directed by your caregiver. °· Take over-the-counter pain medicine only if ordered by your caregiver. Do not take aspirin because it can cause an upset stomach or bleeding. °· Try a clear liquid diet (broth or water) as ordered by your caregiver. Slowly move to a bland diet, as tolerated, if the pain is related to the stomach or intestine. °· Have a thermometer and take your temperature several times a day, and record it. °· Bed rest and sleep, if it helps the pain. °· Avoid sexual intercourse, if it causes pain. °· Avoid stressful situations. °· Keep your follow-up appointments and tests, as your caregiver orders. °· If the pain does not go away with medicine or surgery, you may try: °¨ Acupuncture. °¨ Relaxation exercises (yoga, meditation). °¨ Group therapy. °¨ Counseling. °SEEK MEDICAL CARE IF:  °· You notice certain foods cause stomach pain. °· Your home care treatment is not helping your pain. °· You need stronger pain medicine. °· You want your IUD removed. °· You feel faint or   lightheaded. °· You develop nausea and vomiting. °· You develop a rash. °· You are having side effects or an allergy to your medicine. °SEEK IMMEDIATE MEDICAL CARE IF:  °· Your  pain does not go away or gets worse. °· You have a fever. °· Your pain is felt only in portions of the abdomen. The right side could possibly be appendicitis. The left lower portion of the abdomen could be colitis or diverticulitis. °· You are passing blood in your stools (bright red or black tarry stools, with or without vomiting). °· You have blood in your urine. °· You develop chills, with or without a fever. °· You pass out. °MAKE SURE YOU:  °· Understand these instructions. °· Will watch your condition. °· Will get help right away if you are not doing well or get worse. °Document Released: 02/28/2007 Document Revised: 09/17/2013 Document Reviewed: 03/20/2009 °[employer]® Patient Information ©2015 [employer]. This information is not intended to replace advice given to you by your health care provider. Make sure you discuss any questions you have with your health care provider. ° °

## 2014-11-27 NOTE — MAU Note (Signed)
Patient presents with c/o abdominal pain since Sunday night. Denies discharge but states that she is menstruating now.

## 2014-11-27 NOTE — MAU Provider Note (Signed)
History    Diana Roman is a 29y.o. female who presents, unannounced, for abdominal pain.  Patient states that pain started yesterday . Pain located in lower left quadrant.   Patient states that she has taken midol 2pm and 1/2 of a 250 mg naproxen at 11am without relief.  Patient reports nausea, but no vomiting.  Patient taking medications for depression and migraines stating these cause constipation.  Patient reports last bowel movement on Monday.  Patient denies fever, chills, and diarrhea.  Patient denies change in sexual partners in the last months.  Patient reports recent visit to ED for back pain and was started on naproxen and macrobid.  Patient states no issues with urination or back at current.   There are no active problems to display for this patient.   Chief Complaint  Patient presents with  . Abdominal Pain   HPI  OB History    Gravida Para Term Preterm AB TAB SAB Ectopic Multiple Living   1 1 1  0 0 0 0 0 0 1      Past Medical History  Diagnosis Date  . No pertinent past medical history   . Abnormal Pap smear 2011  . Yeast infection   . H/O bacterial infection   . Sickle cell trait   . Headache(784.0)     Frequent  . Migraine   . Hypertension   . H/O varicella   . Bipolar 1 disorder   . Anxiety   . Depression     on meds, doing well  . Infection     UTI  . Vaginal Pap smear, abnormal     repeat was normal    Past Surgical History  Procedure Laterality Date  . Dental surgery    . No past surgeries      Family History  Problem Relation Age of Onset  . Hypertension Mother   . Hypertension Sister   . Diabetes Maternal Grandmother   . Glaucoma Maternal Grandmother     History  Substance Use Topics  . Smoking status: Never Smoker   . Smokeless tobacco: Never Used  . Alcohol Use: Yes     Comment: 2/wk    Allergies: No Known Allergies  Prescriptions prior to admission  Medication Sig Dispense Refill Last Dose  . clonazePAM (KLONOPIN) 1 MG tablet  Take 1 mg by mouth 2 (two) times daily as needed for anxiety.    Past Month at Unknown time  . ibuprofen (ADVIL,MOTRIN) 200 MG tablet Take 600 mg by mouth every 6 (six) hours as needed for moderate pain.   Past Week at Unknown time  . lamoTRIgine (LAMICTAL) 200 MG tablet Take 200 mg by mouth daily.   11/26/2014 at Unknown time  . methocarbamol (ROBAXIN) 500 MG tablet Take 1 tablet (500 mg total) by mouth 2 (two) times daily as needed for muscle spasms. 20 tablet 0 11/26/2014 at Unknown time  . naproxen (NAPROSYN) 250 MG tablet Take 1 tablet (250 mg total) by mouth 2 (two) times daily with a meal. 30 tablet 0 11/27/2014 at Unknown time  . nitrofurantoin, macrocrystal-monohydrate, (MACROBID) 100 MG capsule Take 1 capsule (100 mg total) by mouth 2 (two) times daily. 14 capsule 0 11/27/2014 at Unknown time  . nortriptyline (PAMELOR) 50 MG capsule Take 50 mg by mouth at bedtime.   11/26/2014 at Unknown time    ROS  See HPI Above Physical Exam   Blood pressure 132/85, pulse 80, temperature 98.6 F (37 C), temperature source Oral,  resp. rate 18, height  (1.676 m), weight 98.487 kg (217 lb 2 oz), last menstrual period 11/26/2014.  Results for orders placed or performed during the hospital encounter of 11/27/14 (from the past 24 hour(s))  Urinalysis, Routine w reflex microscopic (not at [facility])     Status: Abnormal   Collection Time: 11/27/14  5:38 PM  Result Value Ref Range   Color, Urine YELLOW YELLOW   APPearance CLEAR CLEAR   Specific Gravity, Urine >1.030 (H) 1.005 - 1.030   pH 6.0 5.0 - 8.0   Glucose, UA NEGATIVE NEGATIVE mg/dL   Hgb urine dipstick NEGATIVE NEGATIVE   Bilirubin Urine NEGATIVE NEGATIVE   Ketones, ur NEGATIVE NEGATIVE mg/dL   Protein, ur NEGATIVE NEGATIVE mg/dL   Urobilinogen, UA 0.2 0.0 - 1.0 mg/dL   Nitrite NEGATIVE NEGATIVE   Leukocytes, UA NEGATIVE NEGATIVE  Pregnancy, urine POC     Status: None   Collection Time: 11/27/14  7:22 PM  Result Value Ref Range   Preg Test,  Ur NEGATIVE NEGATIVE    Physical Exam  Constitutional: She is oriented to person, place, and time. She appears well-developed and well-nourished. No distress.  HENT:  Head: Normocephalic and atraumatic.  Eyes: EOM are normal. Pupils are equal, round, and reactive to light.  Neck: Normal range of motion.  Cardiovascular: Normal rate, regular rhythm and normal heart sounds.   Respiratory: Effort normal.  GI: Soft. Bowel sounds are normal. She exhibits no distension. There is tenderness (LLQ, Tears elicited with deep palpation.).  Genitourinary: Uterus normal. Cervix exhibits motion tenderness. Cervix exhibits no friability.  Sterile Speculum Exam: -Vaginal Vault: Blood noted, no clots noted -Cervix: Blood from os-GC/CT collected Bimanual Exam: Unable to palpate adnexa due to habitus.  Uterus normal  Musculoskeletal: Normal range of motion.  Neurological: She is alert and oriented to person, place, and time.  Skin: Skin is warm and dry.      ED Course  Assessment: 29 y.o. Female Abdominal Pain Menstruating  Plan: -PE as above -GC/CT -Pelvic US  Follow Up (2220) -Reports pain has improved -Instructed to take motrin 800 mg--rx sent -Instructed to discontinue naproxen and motrin  -Instructed to schedule yearly exam ASAP -Information given regarding abdominal pain -No other questions or concerns -Encouraged to call if any questions or concerns arise prior to next scheduled office visit.  -Discharged to home in improved condition  Mechell Girgis LYNN CNM, MSN 11/27/2014 8:14 PM

## 2014-11-27 NOTE — MAU Note (Signed)
Sunday night had pain in rt side and low back.  Monday pain was rt side.  Went to the ER on Monday, was dx with muscle spasms, treated for UTI, muscle spasm and pain med.   Pain today is worse than on Monday, LLQ. Sharp shooting pain.

## 2014-11-28 LAB — GC/CHLAMYDIA PROBE AMP (~~LOC~~) NOT AT ARMC
Chlamydia: NEGATIVE
Neisseria Gonorrhea: NEGATIVE

## 2015-04-21 ENCOUNTER — Emergency Department (INDEPENDENT_AMBULATORY_CARE_PROVIDER_SITE_OTHER)
Admission: EM | Admit: 2015-04-21 | Discharge: 2015-04-21 | Disposition: A | Discharge status: Home / Self Care | Payer: BLUE CROSS/BLUE SHIELD | Source: Home / Self Care | Attending: Family Medicine | Admitting: Family Medicine

## 2015-04-21 ENCOUNTER — Encounter (HOSPITAL_COMMUNITY): Payer: Self-pay | Admitting: Emergency Medicine

## 2015-04-21 DIAGNOSIS — J029 Acute pharyngitis, unspecified: Secondary | ICD-10-CM | POA: Diagnosis not present

## 2015-04-21 DIAGNOSIS — J3489 Other specified disorders of nose and nasal sinuses: Secondary | ICD-10-CM

## 2015-04-21 DIAGNOSIS — J069 Acute upper respiratory infection, unspecified: Secondary | ICD-10-CM

## 2015-04-21 DIAGNOSIS — J040 Acute laryngitis: Secondary | ICD-10-CM | POA: Diagnosis not present

## 2015-04-21 LAB — POCT RAPID STREP A: Streptococcus, Group A Screen (Direct): NEGATIVE

## 2015-04-21 NOTE — ED Provider Notes (Signed)
CSN: [id number]     Arrival date & time 04/21/15  1305 History   First MD Initiated Contact with Patient 04/21/15 1427     Chief Complaint  Patient presents with  . Sore Throat   (Consider location/radiation/quality/duration/timing/severity/associated sxs/prior Treatment) HPI Comments: 29 year old female complaining of  wound in approximately 4-5 days history of body aches, sore throat, nausea and vomiting secondary to cough, fatigue and malaise. She has been taking OTC medications including NyQuil which does help temporarily with her symptoms. She is also complaining of laryngitis.    Past Medical History  Diagnosis Date  . No pertinent past medical history   . Abnormal Pap smear 2011  . Yeast infection   . H/O bacterial infection   . Sickle cell trait (HCC)   . Headache(784.0)     Frequent  . Migraine   . Hypertension   . H/O varicella   . Bipolar 1 disorder (HCC)   . Anxiety   . Depression     on meds, doing well  . Infection     UTI  . Vaginal Pap smear, abnormal     repeat was normal   Past Surgical History  Procedure Laterality Date  . Dental surgery    . No past surgeries     Family History  Problem Relation Age of Onset  . Hypertension Mother   . Hypertension Sister   . Diabetes Maternal Grandmother   . Glaucoma Maternal Grandmother    Social History  Substance Use Topics  . Smoking status: Never Smoker   . Smokeless tobacco: Never Used  . Alcohol Use: Yes     Comment: 2/wk   OB History    Gravida Para Term Preterm AB TAB SAB Ectopic Multiple Living   1 1 1  0 0 0 0 0 0 1     Review of Systems  Constitutional: Positive for activity change and fatigue. Negative for fever, chills and appetite change.  HENT: Positive for congestion and postnasal drip. Negative for facial swelling and rhinorrhea.   Eyes: Negative.   Respiratory: Positive for cough. Negative for shortness of breath.   Cardiovascular: Negative.   Gastrointestinal: Positive for vomiting.  Negative for abdominal pain.  Musculoskeletal: Negative for neck pain and neck stiffness.  Skin: Negative.  Negative for pallor and rash.  Neurological: Negative.     Allergies  Review of patient's allergies indicates no known allergies.  Home Medications   Prior to Admission medications   Medication Sig Start Date End Date Taking? Authorizing Provider  ALPRAZolam Prudy Feeler(XANAX) 0.5 MG tablet Take 0.5 mg by mouth at bedtime as needed for anxiety.   Yes Historical Provider, MD  Lurasidone HCl (LATUDA PO) Take by mouth.   Yes Historical Provider, MD  clonazePAM (KLONOPIN) 1 MG tablet Take 1 mg by mouth 2 (two) times daily as needed for anxiety.     Historical Provider, MD  ibuprofen (ADVIL,MOTRIN) 800 MG tablet Take 1 tablet (800 mg total) by mouth every 8 (eight) hours as needed. 11/27/14   Gerrit HeckJessica Emly, CNM  lamoTRIgine (LAMICTAL) 200 MG tablet Take 200 mg by mouth daily.    Historical Provider, MD  methocarbamol (ROBAXIN) 500 MG tablet Take 1 tablet (500 mg total) by mouth 2 (two) times daily as needed for muscle spasms. 11/25/14   Everlene FarrierWilliam Dansie, PA-C  nitrofurantoin, macrocrystal-monohydrate, (MACROBID) 100 MG capsule Take 1 capsule (100 mg total) by mouth 2 (two) times daily. 11/25/14   Everlene FarrierWilliam Dansie, PA-C  nortriptyline (PAMELOR) 50 MG capsule  Take 50 mg by mouth at bedtime.    Historical Provider, MD   Meds Ordered and Administered this Visit  Medications - No data to display  BP 133/98 mmHg  Pulse 73  Temp(Src) 99.1 F (37.3 C) (Oral)  Resp 16  SpO2 98%  LMP 04/18/2015 No data found.   Physical Exam  Constitutional: She is oriented to person, place, and time. She appears well-developed and well-nourished. No distress.  HENT:  Bilateral TMs are mildly retracted. Otherwise, normal Oropharynx mildly erythematous with clear PND. No exudates or swelling.  Eyes: EOM are normal.  Neck: Normal range of motion. Neck supple.  Cardiovascular: Normal rate, regular rhythm and normal heart  sounds.   Pulmonary/Chest: Effort normal and breath sounds normal. No respiratory distress. She has no wheezes. She has no rales.  Musculoskeletal: Normal range of motion. She exhibits no edema.  Lymphadenopathy:    She has no cervical adenopathy.  Neurological: She is alert and oriented to person, place, and time.  Skin: Skin is warm and dry. No rash noted.  Psychiatric: She has a normal mood and affect.  Nursing note and vitals reviewed.   ED Course  Procedures (including critical care time)  Labs Review Labs Reviewed  POCT RAPID STREP A    Imaging Review No results found.   Visual Acuity Review  Right Eye Distance:   Left Eye Distance:   Bilateral Distance:    Right Eye Near:   Left Eye Near:    Bilateral Near:         MDM   1. URI (upper respiratory infection)   2. Sinus drainage   3. Laryngitis   4. Pharyngitis    Drink plenty of fluids, stay well-hydrated and continue taking your OTC medication for symptom relief. May also add Zyrtec or Allegra when necessary. Ibuprofen every 6-8 hours as needed for sore throat and body aches.    Hayden Rasmussen, NP 04/21/15 [phone]

## 2015-04-21 NOTE — Discharge Instructions (Signed)
Laryngitis Laryngitis is inflammation of your vocal cords. This causes hoarseness, coughing, loss of voice, sore throat, or a dry throat. Your vocal cords are two bands of muscles that are found in your throat. When you speak, these cords come together and vibrate. These vibrations come out through your mouth as sound. When your vocal cords are inflamed, your voice sounds different. Laryngitis can be temporary (acute) or long-term (chronic). Most cases of acute laryngitis improve with time. Chronic laryngitis is laryngitis that lasts for more than three weeks. CAUSES Acute laryngitis may be caused by:  A viral infection.  Lots of talking, yelling, or singing. This is also called vocal strain.  Bacterial infections. Chronic laryngitis may be caused by:  Vocal strain.  Injury to your vocal cords.  Acid reflux (gastroesophageal reflux disease or GERD).  Allergies.  Sinus infection.  Smoking.  Alcohol abuse.  Breathing in chemicals or dust.  Growths on the vocal cords. RISK FACTORS Risk factors for laryngitis include:  Smoking.  Alcohol abuse.  Having allergies. SIGNS AND SYMPTOMS Symptoms of laryngitis may include:  Low, hoarse voice.  Loss of voice.  Dry cough.  Sore throat.  Stuffy nose. DIAGNOSIS Laryngitis may be diagnosed by:  Physical exam.  Throat culture.  Blood test.  Laryngoscopy. This procedure allows your health care provider to look at your vocal cords with a mirror or viewing tube. TREATMENT Treatment for laryngitis depends on what is causing it. Usually, treatment involves resting your voice and using medicines to soothe your throat. However, if your laryngitis is caused by a bacterial infection, you may need to take antibiotic medicine. If your laryngitis is caused by a growth, you may need to have a procedure to remove it. HOME CARE INSTRUCTIONS  Drink enough fluid to keep your urine clear or pale yellow.  Breathe in moist air. Use a  humidifier if you live in a dry climate.  Take medicines only as directed by your health care provider.  If you were prescribed an antibiotic medicine, finish it all even if you start to feel better.  Do not smoke cigarettes or electronic cigarettes. If you need help quitting, ask your health care provider.  Talk as little as possible. Also avoid whispering, which can cause vocal strain.  Write instead of talking. Do this until your voice is back to normal. SEEK MEDICAL CARE IF:  You have a fever.  You have increasing pain.  You have difficulty swallowing. SEEK IMMEDIATE MEDICAL CARE IF:  You cough up blood.  You have trouble breathing.   This information is not intended to replace advice given to you by your health care provider. Make sure you discuss any questions you have with your health care provider.   Document Released: 05/03/2005 Document Revised: 05/24/2014 Document Reviewed: 10/16/2013 Elsevier Interactive Patient Education 2016 Elsevier Inc.  Cough, Adult Coughing is a reflex that clears your throat and your airways. Coughing helps to heal and protect your lungs. It is normal to cough occasionally, but a cough that happens with other symptoms or lasts a long time may be a sign of a condition that needs treatment. A cough may last only 2-3 weeks (acute), or it may last longer than 8 weeks (chronic). CAUSES Coughing is commonly caused by:  Breathing in substances that irritate your lungs.  A viral or bacterial respiratory infection.  Allergies.  Asthma.  Postnasal drip.  Smoking.  Acid backing up from the stomach into the esophagus (gastroesophageal reflux).  Certain medicines.  Chronic  lung problems, including COPD (or rarely, lung cancer).  Other medical conditions such as heart failure. HOME CARE INSTRUCTIONS  Pay attention to any changes in your symptoms. Take these actions to help with your discomfort:  Take medicines only as told by your health  care provider.  If you were prescribed an antibiotic medicine, take it as told by your health care provider. Do not stop taking the antibiotic even if you start to feel better.  Talk with your health care provider before you take a cough suppressant medicine.  Drink enough fluid to keep your urine clear or pale yellow.  If the air is dry, use a cold steam vaporizer or humidifier in your bedroom or your home to help loosen secretions.  Avoid anything that causes you to cough at work or at home.  If your cough is worse at night, try sleeping in a semi-upright position.  Avoid cigarette smoke. If you smoke, quit smoking. If you need help quitting, ask your health care provider.  Avoid caffeine.  Avoid alcohol.  Rest as needed. SEEK MEDICAL CARE IF:   You have new symptoms.  You cough up pus.  Your cough does not get better after 2-3 weeks, or your cough gets worse.  You cannot control your cough with suppressant medicines and you are losing sleep.  You develop pain that is getting worse or pain that is not controlled with pain medicines.  You have a fever.  You have unexplained weight loss.  You have night sweats. SEEK IMMEDIATE MEDICAL CARE IF:  You cough up blood.  You have difficulty breathing.  Your heartbeat is very fast.   This information is not intended to replace advice given to you by your health care provider. Make sure you discuss any questions you have with your health care provider.   Document Released: 10/30/2010 Document Revised: 01/22/2015 Document Reviewed: 07/10/2014 Elsevier Interactive Patient Education 2016 Elsevier Inc.  Sore Throat A sore throat is pain, burning, irritation, or scratchiness of the throat. There is often pain or tenderness when swallowing or talking. A sore throat may be accompanied by other symptoms, such as coughing, sneezing, fever, and swollen neck glands. A sore throat is often the first sign of another sickness, such as a  cold, flu, strep throat, or mononucleosis (commonly known as mono). Most sore throats go away without medical treatment. CAUSES  The most common causes of a sore throat include:  A viral infection, such as a cold, flu, or mono.  A bacterial infection, such as strep throat, tonsillitis, or whooping cough.  Seasonal allergies.  Dryness in the air.  Irritants, such as smoke or pollution.  Gastroesophageal reflux disease (GERD). HOME CARE INSTRUCTIONS   Only take over-the-counter medicines as directed by your caregiver.  Drink enough fluids to keep your urine clear or pale yellow.  Rest as needed.  Try using throat sprays, lozenges, or sucking on hard candy to ease any pain (if older than 4 years or as directed).  Sip warm liquids, such as broth, herbal tea, or warm water with honey to relieve pain temporarily. You may also eat or drink cold or frozen liquids such as frozen ice pops.  Gargle with salt water (mix 1 tsp salt with 8 oz of water).  Do not smoke and avoid secondhand smoke.  Put a cool-mist humidifier in your bedroom at night to moisten the air. You can also turn on a hot shower and sit in the bathroom with the door closed for  5-10 minutes. SEEK IMMEDIATE MEDICAL CARE IF:  You have difficulty breathing.  You are unable to swallow fluids, soft foods, or your saliva.  You have increased swelling in the throat.  Your sore throat does not get better in 7 days.  You have nausea and vomiting.  You have a fever or persistent symptoms for more than 2-3 days.  You have a fever and your symptoms suddenly get worse. MAKE SURE YOU:   Understand these instructions.  Will watch your condition.  Will get help right away if you are not doing well or get worse.   This information is not intended to replace advice given to you by your health care provider. Make sure you discuss any questions you have with your health care provider.   Document Released: 06/10/2004  Document Revised: 05/24/2014 Document Reviewed: 01/09/2012 Elsevier Interactive Patient Education 2016 Elsevier Inc.  Upper Respiratory Infection, Adult Most upper respiratory infections (URIs) are a viral infection of the air passages leading to the lungs. A URI affects the nose, throat, and upper air passages. The most common type of URI is nasopharyngitis and is typically referred to as "the common cold." URIs run their course and usually go away on their own. Most of the time, a URI does not require medical attention, but sometimes a bacterial infection in the upper airways can follow a viral infection. This is called a secondary infection. Sinus and middle ear infections are common types of secondary upper respiratory infections. Bacterial pneumonia can also complicate a URI. A URI can worsen asthma and chronic obstructive pulmonary disease (COPD). Sometimes, these complications can require emergency medical care and may be life threatening.  CAUSES Almost all URIs are caused by viruses. A virus is a type of germ and can spread from one person to another.  RISKS FACTORS You may be at risk for a URI if:   You smoke.   You have chronic heart or lung disease.  You have a weakened defense (immune) system.   You are very young or very old.   You have nasal allergies or asthma.  You work in crowded or poorly ventilated areas.  You work in health care facilities or schools. SIGNS AND SYMPTOMS  Symptoms typically develop 2-3 days after you come in contact with a cold virus. Most viral URIs last 7-10 days. However, viral URIs from the influenza virus (flu virus) can last 14-18 days and are typically more severe. Symptoms may include:   Runny or stuffy (congested) nose.   Sneezing.   Cough.   Sore throat.   Headache.   Fatigue.   Fever.   Loss of appetite.   Pain in your forehead, behind your eyes, and over your cheekbones (sinus pain).  Muscle aches.  DIAGNOSIS   Your health care provider may diagnose a URI by:  Physical exam.  Tests to check that your symptoms are not due to another condition such as:  Strep throat.  Sinusitis.  Pneumonia.  Asthma. TREATMENT  A URI goes away on its own with time. It cannot be cured with medicines, but medicines may be prescribed or recommended to relieve symptoms. Medicines may help:  Reduce your fever.  Reduce your cough.  Relieve nasal congestion. HOME CARE INSTRUCTIONS   Take medicines only as directed by your health care provider.   Gargle warm saltwater or take cough drops to comfort your throat as directed by your health care provider.  Use a warm mist humidifier or inhale steam from a  shower to increase air moisture. This may make it easier to breathe.  Drink enough fluid to keep your urine clear or pale yellow.   Eat soups and other clear broths and maintain good nutrition.   Rest as needed.   Return to work when your temperature has returned to normal or as your health care provider advises. You may need to stay home longer to avoid infecting others. You can also use a face mask and careful hand washing to prevent spread of the virus.  Increase the usage of your inhaler if you have asthma.   Do not use any tobacco products, including cigarettes, chewing tobacco, or electronic cigarettes. If you need help quitting, ask your health care provider. PREVENTION  The best way to protect yourself from getting a cold is to practice good hygiene.   Avoid oral or hand contact with people with cold symptoms.   Wash your hands often if contact occurs.  There is no clear evidence that vitamin C, vitamin E, echinacea, or exercise reduces the chance of developing a cold. However, it is always recommended to get plenty of rest, exercise, and practice good nutrition.  SEEK MEDICAL CARE IF:   You are getting worse rather than better.   Your symptoms are not controlled by medicine.   You  have chills.  You have worsening shortness of breath.  You have brown or red mucus.  You have yellow or brown nasal discharge.  You have pain in your face, especially when you bend forward.  You have a fever.  You have swollen neck glands.  You have pain while swallowing.  You have white areas in the back of your throat. SEEK IMMEDIATE MEDICAL CARE IF:   You have severe or persistent:  Headache.  Ear pain.  Sinus pain.  Chest pain.  You have chronic lung disease and any of the following:  Wheezing.  Prolonged cough.  Coughing up blood.  A change in your usual mucus.  You have a stiff neck.  You have changes in your:  Vision.  Hearing.  Thinking.  Mood. MAKE SURE YOU:   Understand these instructions.  Will watch your condition.  Will get help right away if you are not doing well or get worse.   This information is not intended to replace advice given to you by your health care provider. Make sure you discuss any questions you have with your health care provider.   Document Released: 10/27/2000 Document Revised: 09/17/2014 Document Reviewed: 08/08/2013 Elsevier Interactive Patient Education [employer]2016 Elsevier Inc.

## 2015-04-21 NOTE — ED Notes (Signed)
Reports cough, runny nose, coughing so hard at times that vomiting occurs.

## 2015-04-24 LAB — CULTURE, GROUP A STREP

## 2015-04-24 NOTE — ED Notes (Signed)
Discussed final lab report w Griffin BasilJD Kindl, MD. No further action required

## 2015-08-18 ENCOUNTER — Encounter (HOSPITAL_COMMUNITY): Payer: Self-pay | Admitting: *Deleted

## 2015-08-18 ENCOUNTER — Emergency Department (INDEPENDENT_AMBULATORY_CARE_PROVIDER_SITE_OTHER)
Admission: EM | Admit: 2015-08-18 | Discharge: 2015-08-18 | Disposition: A | Discharge status: Home / Self Care | Payer: BLUE CROSS/BLUE SHIELD | Source: Home / Self Care | Attending: Family Medicine | Admitting: Family Medicine

## 2015-08-18 ENCOUNTER — Encounter (HOSPITAL_COMMUNITY): Payer: Self-pay | Admitting: Emergency Medicine

## 2015-08-18 DIAGNOSIS — R109 Unspecified abdominal pain: Secondary | ICD-10-CM | POA: Insufficient documentation

## 2015-08-18 DIAGNOSIS — I10 Essential (primary) hypertension: Secondary | ICD-10-CM | POA: Diagnosis not present

## 2015-08-18 DIAGNOSIS — R1010 Upper abdominal pain, unspecified: Secondary | ICD-10-CM

## 2015-08-18 LAB — COMPREHENSIVE METABOLIC PANEL
ALT: 16 U/L (ref 14–54)
AST: 17 U/L (ref 15–41)
Albumin: 3.5 g/dL (ref 3.5–5.0)
Alkaline Phosphatase: 45 U/L (ref 38–126)
Anion gap: 8 (ref 5–15)
BUN: 15 mg/dL (ref 6–20)
CHLORIDE: 108 mmol/L (ref 101–111)
CO2: 23 mmol/L (ref 22–32)
CREATININE: 0.81 mg/dL (ref 0.44–1.00)
Calcium: 9.2 mg/dL (ref 8.9–10.3)
GFR calc Af Amer: >60 mL/min (ref >60)
GLUCOSE: 113 mg/dL — AB (ref 65–99)
Potassium: 3.6 mmol/L (ref 3.5–5.1)
SODIUM: 139 mmol/L (ref 135–145)
Total Bilirubin: 0.3 mg/dL (ref 0.3–1.2)
Total Protein: 6.8 g/dL (ref 6.5–8.1)

## 2015-08-18 LAB — CBC
HCT: 36.5 % (ref 36.0–46.0)
Hemoglobin: 11.5 g/dL — ABNORMAL LOW (ref 12.0–15.0)
MCH: 27.8 pg (ref 26.0–34.0)
MCHC: 31.5 g/dL (ref 30.0–36.0)
MCV: 88.2 fL (ref 78.0–100.0)
PLATELETS: 206 10*3/uL (ref 150–400)
RBC: 4.14 MIL/uL (ref 3.87–5.11)
RDW: 14.4 % (ref 11.5–15.5)
WBC: 6.7 10*3/uL (ref 4.0–10.5)

## 2015-08-18 LAB — LIPASE, BLOOD: LIPASE: 24 U/L (ref 11–51)

## 2015-08-18 NOTE — ED Notes (Signed)
[facility] and her urine and their exam was negative so she was advised to come to ED for further evaluation of this pain.  Pt describes pain in left mid abdomen pain with pain radiating to left flank.  Pain is intermittent since Saturday and she felt like it felt like a bladder infection, no pain or burning with urination though,  No fever or chills or diarrhea or vomiting.

## 2015-08-18 NOTE — ED Notes (Signed)
Call for pt in waiting area, no answer 

## 2015-08-18 NOTE — ED Notes (Signed)
Patient c/o left sided flank pain x 2 days. Patient reports she started her menstrual cycle today. She is tender to the touch. Patient is in NAD.

## 2015-08-18 NOTE — ED Provider Notes (Signed)
CSN: [id number]     Arrival date & time 08/18/15  1920 History   First MD Initiated Contact with Patient 08/18/15 1959     Chief Complaint  Patient presents with  . Flank Pain   (Consider location/radiation/quality/duration/timing/severity/associated sxs/prior Treatment) HPI  Past Medical History  Diagnosis Date  . No pertinent past medical history   . Abnormal Pap smear 2011  . Yeast infection   . H/O bacterial infection   . Sickle cell trait (HCC)   . Headache(784.0)     Frequent  . Migraine   . Hypertension   . H/O varicella   . Bipolar 1 disorder (HCC)   . Anxiety   . Depression     on meds, doing well  . Infection     UTI  . Vaginal Pap smear, abnormal     repeat was normal   Past Surgical History  Procedure Laterality Date  . Dental surgery    . No past surgeries     Family History  Problem Relation Age of Onset  . Hypertension Mother   . Hypertension Sister   . Diabetes Maternal Grandmother   . Glaucoma Maternal Grandmother    Social History  Substance Use Topics  . Smoking status: Never Smoker   . Smokeless tobacco: Never Used  . Alcohol Use: Yes     Comment: 2/wk   OB History    Gravida Para Term Preterm AB TAB SAB Ectopic Multiple Living   0 0 0 0 0 0 1     Review of Systems  Allergies  Review of patient's allergies indicates no known allergies.  Home Medications   Prior to Admission medications   Medication Sig Start Date End Date Taking? Authorizing Provider  ALPRAZolam Prudy Feeler) 0.5 MG tablet Take 0.5 mg by mouth at bedtime as needed for anxiety.   Yes Historical Provider, MD  clonazePAM (KLONOPIN) 1 MG tablet Take 1 mg by mouth 2 (two) times daily as needed for anxiety.    Yes Historical Provider, MD  ibuprofen (ADVIL,MOTRIN) 800 MG tablet Take 1 tablet (800 mg total) by mouth every 8 (eight) hours as needed. 11/27/14  Yes Gerrit Heck, CNM  lamoTRIgine (LAMICTAL) 200 MG tablet Take 200 mg by mouth daily.   Yes Historical Provider, MD   Lurasidone HCl (LATUDA PO) Take by mouth.   Yes Historical Provider, MD  methocarbamol (ROBAXIN) 500 MG tablet Take 1 tablet (500 mg total) by mouth 2 (two) times daily as needed for muscle spasms. 11/25/14  Yes Everlene Farrier, PA-C  nortriptyline (PAMELOR) 50 MG capsule Take 50 mg by mouth at bedtime.   Yes Historical Provider, MD  nitrofurantoin, macrocrystal-monohydrate, (MACROBID) 100 MG capsule Take 1 capsule (100 mg total) by mouth 2 (two) times daily. 11/25/14   Everlene Farrier, PA-C   Meds Ordered and Administered this Visit  Medications - No data to display  BP 143/77 mmHg  Pulse 77  Temp(Src) 98.2 F (36.8 C) (Oral)  Resp 14  SpO2 100%  LMP 08/18/2015 No data found.   Physical Exam  ED Course  Procedures (including critical care time)  Labs Review Labs Reviewed - No data to display  Imaging Review No results found.   Visual Acuity Review  Right Eye Distance:   Left Eye Distance:   Bilateral Distance:    Right Eye Near:   Left Eye Near:    Bilateral Near:         MDM   1. Acute flank  pain    I do not have a good explanation for your pain.   Suggest you go to ER for further evaluation.  You may wish to get a ride in case they need to medicate you.    Tharon AquasFrank C Patrick, PA 08/18/15 2117

## 2015-08-19 ENCOUNTER — Emergency Department (HOSPITAL_COMMUNITY)
Admission: EM | Admit: 2015-08-19 | Discharge: 2015-08-19 | Disposition: A | Discharge status: Home / Self Care | Payer: BLUE CROSS/BLUE SHIELD | Attending: Emergency Medicine | Admitting: Emergency Medicine

## 2015-08-19 LAB — POCT URINALYSIS DIP (DEVICE)
BILIRUBIN URINE: NEGATIVE
Glucose, UA: NEGATIVE mg/dL
HGB URINE DIPSTICK: NEGATIVE
KETONES UR: NEGATIVE mg/dL
LEUKOCYTES UA: NEGATIVE
NITRITE: NEGATIVE
PH: 6 (ref 5.0–8.0)
Protein, ur: NEGATIVE mg/dL
Specific Gravity, Urine: >=1.03 (ref 1.005–1.030)
Urobilinogen, UA: 0.2 mg/dL (ref 0.0–1.0)

## 2015-08-19 NOTE — ED Notes (Signed)
Call for pt in waiting area no answer 

## 2016-02-19 ENCOUNTER — Institutional Professional Consult (permissible substitution): Payer: BLUE CROSS/BLUE SHIELD | Admitting: Neurology

## 2016-12-08 ENCOUNTER — Emergency Department (HOSPITAL_COMMUNITY)
Admission: EM | Admit: 2016-12-08 | Discharge: 2016-12-08 | Disposition: A | Discharge status: Home / Self Care | Payer: BLUE CROSS/BLUE SHIELD | Attending: Emergency Medicine | Admitting: Emergency Medicine

## 2016-12-08 ENCOUNTER — Encounter (HOSPITAL_COMMUNITY): Payer: Self-pay | Admitting: *Deleted

## 2016-12-08 ENCOUNTER — Ambulatory Visit (HOSPITAL_COMMUNITY)
Admission: EM | Admit: 2016-12-08 | Discharge: 2016-12-08 | Disposition: A | Discharge status: Home / Self Care | Payer: BLUE CROSS/BLUE SHIELD

## 2016-12-08 ENCOUNTER — Emergency Department (HOSPITAL_COMMUNITY): Payer: BLUE CROSS/BLUE SHIELD

## 2016-12-08 DIAGNOSIS — R0789 Other chest pain: Secondary | ICD-10-CM | POA: Diagnosis not present

## 2016-12-08 DIAGNOSIS — I10 Essential (primary) hypertension: Secondary | ICD-10-CM | POA: Insufficient documentation

## 2016-12-08 DIAGNOSIS — Z79899 Other long term (current) drug therapy: Secondary | ICD-10-CM | POA: Insufficient documentation

## 2016-12-08 DIAGNOSIS — R079 Chest pain, unspecified: Secondary | ICD-10-CM | POA: Diagnosis present

## 2016-12-08 LAB — CBC
HCT: 35.7 % — ABNORMAL LOW (ref 36.0–46.0)
HEMOGLOBIN: 11.6 g/dL — AB (ref 12.0–15.0)
MCH: 28.1 pg (ref 26.0–34.0)
MCHC: 32.5 g/dL (ref 30.0–36.0)
MCV: 86.4 fL (ref 78.0–100.0)
PLATELETS: 230 10*3/uL (ref 150–400)
RBC: 4.13 MIL/uL (ref 3.87–5.11)
RDW: 15.2 % (ref 11.5–15.5)
WBC: 5.8 10*3/uL (ref 4.0–10.5)

## 2016-12-08 LAB — BASIC METABOLIC PANEL
ANION GAP: 6 (ref 5–15)
BUN: 13 mg/dL (ref 6–20)
CALCIUM: 9 mg/dL (ref 8.9–10.3)
CO2: 23 mmol/L (ref 22–32)
CREATININE: 0.81 mg/dL (ref 0.44–1.00)
Chloride: 108 mmol/L (ref 101–111)
GFR calc non Af Amer: >60 mL/min (ref >60)
Glucose, Bld: 93 mg/dL (ref 65–99)
Potassium: 3.6 mmol/L (ref 3.5–5.1)
SODIUM: 137 mmol/L (ref 135–145)

## 2016-12-08 LAB — I-STAT TROPONIN, ED: TROPONIN I, POC: 0 ng/mL (ref 0.00–0.08)

## 2016-12-08 MED ORDER — FAMOTIDINE 20 MG PO TABS: 20.0000 mg | ORAL_TABLET | Freq: Two times a day (BID) | ORAL | 0 refills | Status: DC

## 2016-12-08 MED ORDER — NAPROXEN 500 MG PO TABS: 500.0000 mg | ORAL_TABLET | Freq: Two times a day (BID) | ORAL | 0 refills | Status: DC

## 2016-12-08 MED FILL — NAPROXEN 500 MG TABLET: 500 | 7 days supply | Qty: 14 | Fill #0

## 2016-12-08 MED FILL — FAMOTIDINE 20 MG TABLET: 20 | 7 days supply | Qty: 15 | Fill #0

## 2016-12-08 NOTE — ED Triage Notes (Signed)
Pt reports mid chest wall pain for extended amount of time. Pain increases with certain positions and deep breathing. Onset yesterday of left upper arm pain. When at rest, states it feels like someone is siting on her chest. No resp distress is noted at triage.

## 2016-12-08 NOTE — ED Triage Notes (Signed)
Chest pain for 5 days.  Patient started taking aspirins at onset.  Pain has been intermittent.  Reports with deep inspiration has a sharp pain in chest, otherwise feels like someone sitting on chest. Left upper arm is hurting, onset yesterday.  Patient does not smoke.  Patient is not on birth control.

## 2016-12-08 NOTE — ED Notes (Signed)
Lyman BishopLawrence, np spoke with patient

## 2016-12-08 NOTE — Discharge Instructions (Signed)
Please read and follow all provided instructions.  Your diagnoses today include:  1. Chest wall pain    Tests performed today include:  Chest x-ray  Blood test for heart inflammation - normal  Blood counts and electrolytes  Vital signs. See below for your results today.   Medications prescribed:   Naproxen - anti-inflammatory pain medication  Do not exceed 500mg  naproxen every 12 hours, take with food  You have been prescribed an anti-inflammatory medication or NSAID. Take with food. Take smallest effective dose for the shortest duration needed for your pain. Stop taking if you experience stomach pain or vomiting.    Pepcid (famotidine) - antihistamine  You can find this medication over-the-counter.   DO NOT exceed:   20mg  Pepcid every 12 hours  Take any prescribed medications only as directed.  Home care instructions:  Follow any educational materials contained in this packet.  BE VERY CAREFUL not to take multiple medicines containing Tylenol (also called acetaminophen). Doing so can lead to an overdose which can damage your liver and cause liver failure and possibly death.   Follow-up instructions: Please follow-up with your primary care provider in the next 3 days for further evaluation of your symptoms.   Return instructions:   Please return to the Emergency Department if you experience worsening symptoms.   Please return if you have any other emergent concerns.  Additional Information:  Your vital signs today were: BP (!) 137/100 (BP Location: Left Arm)    Pulse 75    Temp 98.5 F (36.9 C) (Oral)    Resp 18    LMP 11/25/2016    SpO2 98%  If your blood pressure (BP) was elevated above 135/85 this visit, please have this repeated by your doctor within one month. --------------

## 2016-12-08 NOTE — ED Provider Notes (Signed)
[facility]-EMERGENCY DEPT Provider Note   CSN: [id number] Arrival date & time: 12/08/16  1351  By signing my name below, I, Diana Roman, attest that this documentation has been prepared under the direction and in the presence of Diana Crigler PA-C  Electronically Signed: Vista Roman, ED Scribe. 12/08/16. 4:44 PM.   History   Chief Complaint Chief Complaint  Patient presents with  . Chest Pain    HPI HPI Comments: Diana TOUCHETTE is a 31 y.o. female with a PMHx of HTN, anxiety, who presents to the Emergency Department complaining of intermittent, central CP onset approximately 5 days ago. She describes the CP as achy, exacerbated during deep inspiration and mildly relieved by aspirin, Bayer, Rolaids and Tums. Her job does not require frequent heavy lifting. She has not had any recent strenuous activity. She denies shortness of breath, LE swelling, calf tenderness, fever. She is not taking birth control. No Hx of DVT.   The history is provided by the patient. No language interpreter was used.    Past Medical History:  Diagnosis Date  . Abnormal Pap smear 2011  . Anxiety   . Bipolar 1 disorder (HCC)   . Depression    on meds, doing well  . H/O bacterial infection   . H/O varicella   . Headache(784.0)    Frequent  . Hypertension   . Infection    UTI  . Migraine   . No pertinent past medical history   . Sickle cell trait (HCC)   . Vaginal Pap smear, abnormal    repeat was normal  . Yeast infection     There are no active problems to display for this patient.   Past Surgical History:  Procedure Laterality Date  . DENTAL SURGERY    . NO PAST SURGERIES      OB History    Gravida Para Term Preterm AB Living   1 1 1  0 0 1   SAB TAB Ectopic Multiple Live Births   0 0 0 0 1      Home Medications    Prior to Admission medications   Medication Sig Start Date End Date Taking? Authorizing Provider  ALPRAZolam Prudy Feeler) 0.5 MG tablet Take 0.5 mg by mouth at bedtime as needed  for anxiety.    [provider]  clonazePAM (KLONOPIN) 1 MG tablet Take 1 mg by mouth 2 (two) times daily as needed for anxiety.     [provider]  ibuprofen (ADVIL,MOTRIN) 800 MG tablet Take 1 tablet (800 mg total) by mouth every 8 (eight) hours as needed. 11/27/14   Gerrit Heck, CNM  lamoTRIgine (LAMICTAL) 200 MG tablet Take 200 mg by mouth daily.    [provider]  Lurasidone HCl (LATUDA PO) Take by mouth.    [provider]  methocarbamol (ROBAXIN) 500 MG tablet Take 1 tablet (500 mg total) by mouth 2 (two) times daily as needed for muscle spasms. 11/25/14   Everlene Farrier, PA-C  nitrofurantoin, macrocrystal-monohydrate, (MACROBID) 100 MG capsule Take 1 capsule (100 mg total) by mouth 2 (two) times daily. 11/25/14   Everlene Farrier, PA-C  nortriptyline (PAMELOR) 50 MG capsule Take 50 mg by mouth at bedtime.    [provider]    Family History Family History  Problem Relation Age of Onset  . Hypertension Mother   . Diabetes Maternal Grandmother   . Glaucoma Maternal Grandmother   . Hypertension Sister     Social History Social History  Substance Use Topics  .  Smoking status: Never Smoker  . Smokeless tobacco: Never Used  . Alcohol use Yes     Comment: 2/wk     Allergies   Patient has no known allergies.   Review of Systems Review of Systems  Constitutional: Negative for fever.  HENT: Negative for rhinorrhea and sore throat.   Eyes: Negative for redness.  Respiratory: Negative for cough and shortness of breath.   Cardiovascular: Positive for chest pain. Negative for leg swelling.  Gastrointestinal: Negative for abdominal pain, diarrhea, nausea and vomiting.  Genitourinary: Negative for dysuria.  Musculoskeletal: Negative for myalgias.  Skin: Negative for rash.  Neurological: Negative for headaches.     Physical Exam Updated Vital Signs BP (!) 137/100 (BP Location: Left Arm)   Pulse 75   Temp 98.5 F (36.9 C)  (Oral)   Resp 18   LMP 11/25/2016   SpO2 98%   Physical Exam  Constitutional: She appears well-developed and well-nourished. No distress.  HENT:  Head: Normocephalic and atraumatic.  Mouth/Throat: Mucous membranes are normal. Mucous membranes are not dry.  Eyes: Conjunctivae are normal.  Neck: Trachea normal and normal range of motion. Neck supple. Normal carotid pulses and no JVD present. No muscular tenderness present. Carotid bruit is not present. No tracheal deviation present.  Cardiovascular: Normal rate, regular rhythm, S1 normal, S2 normal, normal heart sounds and intact distal pulses.  Exam reveals no decreased pulses.   No murmur heard. Pulmonary/Chest: Effort normal. No respiratory distress. She has no wheezes. She exhibits tenderness (readily reproducible tenderness over the upper mid-chest wall).    Abdominal: Soft. Normal aorta and bowel sounds are normal. There is no tenderness. There is no rebound and no guarding.  Musculoskeletal: Normal range of motion.  Neurological: She is alert.  Skin: Skin is warm and dry. She is not diaphoretic. No cyanosis. No pallor.  Psychiatric: She has a normal mood and affect. Judgment normal.  Nursing note and vitals reviewed.    ED Treatments / Results  DIAGNOSTIC STUDIES: Oxygen Saturation is 98% on RA, normal by my interpretation.  COORDINATION OF CARE: 4:43 PM-Discussed treatment plan with pt at bedside and pt agreed to plan.   We reviewed results. Will start on naproxen twice a day for 1 week as well as Pepcid.  Encourage return to the emergency department with worsening symptoms, uncontrolled chest pains, difficult breathing, fevers, shortness of breath.  Labs (all labs ordered are listed, but only abnormal results are displayed) Labs Reviewed  CBC - Abnormal; Notable for the following:       Result Value   Hemoglobin 11.6 (*)    HCT 35.7 (*)    All other components within normal limits  BASIC METABOLIC PANEL  I-STAT  TROPONIN, ED    EKG  EKG Interpretation None       Radiology Dg Chest 2 View  Result Date: 12/08/2016 CLINICAL DATA:  Acute chest pain for 5 days. EXAM: CHEST  2 VIEW COMPARISON:  09/22/2013 radiographs FINDINGS: Upper limits normal heart size again noted. There is no evidence of focal airspace disease, pulmonary edema, suspicious pulmonary nodule/mass, pleural effusion, or pneumothorax. No acute bony abnormalities are identified. IMPRESSION: Upper limits normal heart size without evidence of acute cardiopulmonary disease. Electronically Signed   By: Harmon PierJeffrey  Hu M.D.   On: 12/08/2016 14:42    Procedures Procedures (including critical care time)  Medications Ordered in ED Medications - No data to display   Initial Impression / Assessment and Plan / ED Course  I have  reviewed the triage vital signs and the nursing notes.  Pertinent labs & imaging results that were available during my care of the patient were reviewed by me and considered in my medical decision making (see chart for details).     Vital signs reviewed and are as follows: Vitals:   12/08/16 1402  BP: (!) 137/100  Pulse: 75  Resp: 18  Temp: 98.5 F (36.9 C)      Final Clinical Impressions(s) / ED Diagnoses   Final diagnoses:  Chest wall pain   Chest wall pain: Patient with readily reproducible tenderness in the mid chest. No inciting factors. No shortness of breath. Imaging labs are unrevealing. Clinically patient has chest wall pain. PERC negative.    New Prescriptions New Prescriptions   FAMOTIDINE (PEPCID) 20 MG TABLET    Take 1 tablet (20 mg total) by mouth 2 (two) times daily.   NAPROXEN (NAPROSYN) 500 MG TABLET    Take 1 tablet (500 mg total) by mouth 2 (two) times daily.  I personally performed the services described in this documentation, which was scribed in my presence. The recorded information has been reviewed and is accurate.     Diana CriglerGeiple, Ameila Weldon, PA-C 12/08/16 1656    Arby BarrettePfeiffer,  Marcy, MD 12/13/16 1245

## 2018-03-14 ENCOUNTER — Ambulatory Visit (INDEPENDENT_AMBULATORY_CARE_PROVIDER_SITE_OTHER): Payer: Medicaid Other

## 2018-03-14 ENCOUNTER — Ambulatory Visit (HOSPITAL_COMMUNITY)
Admission: EM | Admit: 2018-03-14 | Discharge: 2018-03-14 | Disposition: A | Discharge status: Home / Self Care | Payer: Medicaid Other | Attending: Family Medicine | Admitting: Family Medicine

## 2018-03-14 ENCOUNTER — Encounter (HOSPITAL_COMMUNITY): Payer: Self-pay

## 2018-03-14 DIAGNOSIS — R519 Headache, unspecified: Secondary | ICD-10-CM

## 2018-03-14 DIAGNOSIS — R51 Headache: Secondary | ICD-10-CM

## 2018-03-14 DIAGNOSIS — S60222A Contusion of left hand, initial encounter: Secondary | ICD-10-CM

## 2018-03-14 MED ORDER — IBUPROFEN 800 MG PO TABS: 800.0000 mg | ORAL_TABLET | Freq: Three times a day (TID) | ORAL | 0 refills | Status: DC

## 2018-03-14 NOTE — Discharge Instructions (Signed)
Use anti-inflammatories for pain/swelling. You may take up to 800 mg Ibuprofen every 8 hours with food. You may supplement Ibuprofen with Tylenol [phone] mg every 8 hours.   Ice hand Wear ace wrap for compression and support  Follow up if headache not improving, worsening, developing changes in vision, weakness

## 2018-03-14 NOTE — ED Triage Notes (Signed)
Pt presents with ongoing headache and injury to left hand.

## 2018-03-15 ENCOUNTER — Other Ambulatory Visit: Payer: Self-pay

## 2018-03-15 ENCOUNTER — Emergency Department (HOSPITAL_COMMUNITY): Payer: Medicaid Other

## 2018-03-15 ENCOUNTER — Encounter (HOSPITAL_COMMUNITY): Payer: Self-pay

## 2018-03-15 ENCOUNTER — Emergency Department (HOSPITAL_COMMUNITY)
Admission: EM | Admit: 2018-03-15 | Discharge: 2018-03-16 | Disposition: A | Discharge status: Other Acute Inpatient Hospital | Payer: Medicaid Other | Attending: Emergency Medicine | Admitting: Emergency Medicine

## 2018-03-15 DIAGNOSIS — T1491XA Suicide attempt, initial encounter: Secondary | ICD-10-CM | POA: Diagnosis present

## 2018-03-15 DIAGNOSIS — Y999 Unspecified external cause status: Secondary | ICD-10-CM | POA: Insufficient documentation

## 2018-03-15 DIAGNOSIS — T43012A Poisoning by tricyclic antidepressants, intentional self-harm, initial encounter: Secondary | ICD-10-CM | POA: Diagnosis not present

## 2018-03-15 DIAGNOSIS — T50902A Poisoning by unspecified drugs, medicaments and biological substances, intentional self-harm, initial encounter: Secondary | ICD-10-CM

## 2018-03-15 DIAGNOSIS — X838XXA Intentional self-harm by other specified means, initial encounter: Secondary | ICD-10-CM | POA: Diagnosis not present

## 2018-03-15 DIAGNOSIS — Y9289 Other specified places as the place of occurrence of the external cause: Secondary | ICD-10-CM | POA: Diagnosis not present

## 2018-03-15 DIAGNOSIS — I10 Essential (primary) hypertension: Secondary | ICD-10-CM | POA: Diagnosis not present

## 2018-03-15 DIAGNOSIS — F314 Bipolar disorder, current episode depressed, severe, without psychotic features: Secondary | ICD-10-CM | POA: Diagnosis not present

## 2018-03-15 DIAGNOSIS — T424X2A Poisoning by benzodiazepines, intentional self-harm, initial encounter: Secondary | ICD-10-CM | POA: Diagnosis not present

## 2018-03-15 DIAGNOSIS — Y9389 Activity, other specified: Secondary | ICD-10-CM | POA: Diagnosis not present

## 2018-03-15 DIAGNOSIS — F419 Anxiety disorder, unspecified: Secondary | ICD-10-CM | POA: Diagnosis not present

## 2018-03-15 DIAGNOSIS — R45851 Suicidal ideations: Secondary | ICD-10-CM

## 2018-03-15 LAB — COMPREHENSIVE METABOLIC PANEL
ALK PHOS: 54 U/L (ref 38–126)
ALT: 19 U/L (ref 0–44)
AST: 18 U/L (ref 15–41)
Albumin: 4.2 g/dL (ref 3.5–5.0)
Anion gap: 8 (ref 5–15)
BILIRUBIN TOTAL: 0.8 mg/dL (ref 0.3–1.2)
BUN: 16 mg/dL (ref 6–20)
CO2: 25 mmol/L (ref 22–32)
CREATININE: 0.87 mg/dL (ref 0.44–1.00)
Calcium: 9.1 mg/dL (ref 8.9–10.3)
Chloride: 106 mmol/L (ref 98–111)
GFR calc Af Amer: >60 mL/min (ref >60)
Glucose, Bld: 72 mg/dL (ref 70–99)
Potassium: 3.4 mmol/L — ABNORMAL LOW (ref 3.5–5.1)
Sodium: 139 mmol/L (ref 135–145)
Total Protein: 8.2 g/dL — ABNORMAL HIGH (ref 6.5–8.1)

## 2018-03-15 LAB — ETHANOL: ALCOHOL ETHYL (B): 65 mg/dL — AB (ref <10)

## 2018-03-15 LAB — RAPID URINE DRUG SCREEN, HOSP PERFORMED
Amphetamines: NOT DETECTED
Barbiturates: NOT DETECTED
Benzodiazepines: POSITIVE — AB
Cocaine: NOT DETECTED
Opiates: NOT DETECTED
Tetrahydrocannabinol: NOT DETECTED

## 2018-03-15 LAB — SALICYLATE LEVEL: Salicylate Lvl: <7 mg/dL (ref 2.8–30.0)

## 2018-03-15 LAB — I-STAT BETA HCG BLOOD, ED (MC, WL, AP ONLY): I-stat hCG, quantitative: <5 m[IU]/mL (ref <5)

## 2018-03-15 LAB — CBC
HCT: 39 % (ref 36.0–46.0)
Hemoglobin: 12 g/dL (ref 12.0–15.0)
MCH: 28.3 pg (ref 26.0–34.0)
MCHC: 30.8 g/dL (ref 30.0–36.0)
MCV: 92 fL (ref 80.0–100.0)
Platelets: 238 K/uL (ref 150–400)
RBC: 4.24 MIL/uL (ref 3.87–5.11)
RDW: 15.2 % (ref 11.5–15.5)
WBC: 5.6 K/uL (ref 4.0–10.5)
nRBC: 0 % (ref 0.0–0.2)

## 2018-03-15 LAB — ACETAMINOPHEN LEVEL
Acetaminophen (Tylenol), Serum: <10 ug/mL — ABNORMAL LOW (ref 10–30)
Acetaminophen (Tylenol), Serum: <10 ug/mL — ABNORMAL LOW (ref 10–30)

## 2018-03-15 MED ORDER — CLONAZEPAM 1 MG PO TABS
1.0000 mg | ORAL_TABLET | Freq: Two times a day (BID) | ORAL | Status: DC | PRN
  Filled 2018-03-15: qty 1

## 2018-03-15 MED ORDER — LAMOTRIGINE 100 MG PO TABS
200.0000 mg | ORAL_TABLET | Freq: Every day | ORAL | Status: DC
  Administered 2018-03-15 – 2018-03-16 (×2): 200 mg via ORAL
  Filled 2018-03-15 (×2): qty 2

## 2018-03-15 MED ORDER — LORAZEPAM 1 MG PO TABS: 1.0000 mg | ORAL_TABLET | ORAL | Status: DC | PRN

## 2018-03-15 MED ORDER — IBUPROFEN 800 MG PO TABS
800.0000 mg | ORAL_TABLET | Freq: Three times a day (TID) | ORAL | Status: DC
  Administered 2018-03-15 – 2018-03-16 (×2): 800 mg via ORAL
  Filled 2018-03-15 (×2): qty 1

## 2018-03-15 NOTE — ED Notes (Signed)
Bed: RESA Expected date:  Expected time:  Means of arrival:  Comments: EMS - Xanax OD

## 2018-03-15 NOTE — ED Triage Notes (Addendum)
[place] Patient [place] sitting up on bench and slumped over and laying on ground. Patient left picutres on bridge with note as well before consuming 14x 0.5mg  Xanax pills and walking into trail. Patient did state earlier with EMS that she wants to die and has that right and for EMS to leave her alone. Patient also stated she had some ETOH as well, unsure of amount consumed. Patient was semi conscious, as of now patient is only alert to painful stimuli, however still maintaining own airway.

## 2018-03-15 NOTE — ED Notes (Signed)
[facility] PAPERS SERVED  

## 2018-03-15 NOTE — ED Notes (Signed)
Pt assisted to the bathroom with assistance. Gait slightly unsteady

## 2018-03-15 NOTE — ED Provider Notes (Signed)
  Physical Exam  BP (!) 148/103   Pulse 100   Temp (!) 97.4 F (36.3 C) (Oral)   Resp (!) 23   Ht 5\' 6"  (1.676 m)   Wt 106 kg   LMP 03/14/2018   SpO2 98%   BMI 37.72 kg/m   Physical Exam  ED Course/Procedures     Procedures  MDM  Sign out pending repeat tylenol level after overdose, TTS consult.   6:53 PM 4 hr tylenol level negative. TTS recommend psych admission. Medically cleared.       Charlynne Pander, MD 03/15/18 [phone]

## 2018-03-15 NOTE — ED Notes (Signed)
Shirt pants socks 1 pair round earrings and lip gloss placed in bag in cabinet labeled pt belongs 16-18 resus a

## 2018-03-15 NOTE — ED Notes (Signed)
Poison Control has been called. Supportive care and labs are drawn.

## 2018-03-15 NOTE — ED Notes (Signed)
POISON CONTROL UPDATED. AWARE 2ND TYLENOL PENDING. QT/QTC 342/444

## 2018-03-15 NOTE — ED Notes (Signed)
Bed: WA21 Expected date:  Expected time:  Means of arrival:  Comments: Hold for [place] pt.
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
## 2018-03-15 NOTE — ED Notes (Addendum)
Pts niece Felizardo Hoffmann, requested to leave number in case pt gets moved before she returns. Requests to be primary contact. [phone]

## 2018-03-16 ENCOUNTER — Encounter (HOSPITAL_COMMUNITY): Payer: Self-pay | Admitting: *Deleted

## 2018-03-16 ENCOUNTER — Inpatient Hospital Stay (HOSPITAL_COMMUNITY)
Admission: AD | Admit: 2018-03-16 | Discharge: 2018-03-21 | DRG: 885 | Disposition: A | Discharge status: Home / Self Care | Payer: Medicaid Other | Source: Intra-hospital | Attending: Psychiatry | Admitting: Psychiatry

## 2018-03-16 DIAGNOSIS — T424X2A Poisoning by benzodiazepines, intentional self-harm, initial encounter: Secondary | ICD-10-CM | POA: Diagnosis present

## 2018-03-16 DIAGNOSIS — F314 Bipolar disorder, current episode depressed, severe, without psychotic features: Secondary | ICD-10-CM | POA: Diagnosis present

## 2018-03-16 DIAGNOSIS — D573 Sickle-cell trait: Secondary | ICD-10-CM | POA: Diagnosis present

## 2018-03-16 DIAGNOSIS — F10239 Alcohol dependence with withdrawal, unspecified: Secondary | ICD-10-CM | POA: Diagnosis present

## 2018-03-16 DIAGNOSIS — T1491XA Suicide attempt, initial encounter: Secondary | ICD-10-CM | POA: Diagnosis not present

## 2018-03-16 DIAGNOSIS — T43012A Poisoning by tricyclic antidepressants, intentional self-harm, initial encounter: Secondary | ICD-10-CM | POA: Diagnosis not present

## 2018-03-16 DIAGNOSIS — Z833 Family history of diabetes mellitus: Secondary | ICD-10-CM

## 2018-03-16 DIAGNOSIS — G4700 Insomnia, unspecified: Secondary | ICD-10-CM | POA: Diagnosis not present

## 2018-03-16 DIAGNOSIS — E559 Vitamin D deficiency, unspecified: Secondary | ICD-10-CM | POA: Diagnosis present

## 2018-03-16 DIAGNOSIS — Z791 Long term (current) use of non-steroidal anti-inflammatories (NSAID): Secondary | ICD-10-CM | POA: Diagnosis not present

## 2018-03-16 DIAGNOSIS — I10 Essential (primary) hypertension: Secondary | ICD-10-CM | POA: Diagnosis present

## 2018-03-16 DIAGNOSIS — Z79899 Other long term (current) drug therapy: Secondary | ICD-10-CM | POA: Diagnosis not present

## 2018-03-16 DIAGNOSIS — Z8249 Family history of ischemic heart disease and other diseases of the circulatory system: Secondary | ICD-10-CM

## 2018-03-16 DIAGNOSIS — F419 Anxiety disorder, unspecified: Secondary | ICD-10-CM | POA: Diagnosis not present

## 2018-03-16 DIAGNOSIS — F332 Major depressive disorder, recurrent severe without psychotic features: Secondary | ICD-10-CM | POA: Diagnosis not present

## 2018-03-16 DIAGNOSIS — F410 Panic disorder [episodic paroxysmal anxiety] without agoraphobia: Secondary | ICD-10-CM | POA: Diagnosis present

## 2018-03-16 DIAGNOSIS — R45851 Suicidal ideations: Secondary | ICD-10-CM

## 2018-03-16 DIAGNOSIS — Z811 Family history of alcohol abuse and dependence: Secondary | ICD-10-CM

## 2018-03-16 DIAGNOSIS — F3181 Bipolar II disorder: Secondary | ICD-10-CM | POA: Diagnosis not present

## 2018-03-16 MED ORDER — CLONAZEPAM 0.5 MG PO TABS: 0.2500 mg | ORAL_TABLET | Freq: Two times a day (BID) | ORAL | Status: DC | PRN

## 2018-03-16 MED ORDER — LAMOTRIGINE 100 MG PO TABS
200.0000 mg | ORAL_TABLET | Freq: Every day | ORAL | Status: DC
  Administered 2018-03-17: 200 mg via ORAL
  Filled 2018-03-16: qty 2
  Filled 2018-03-16 (×2): qty 1
  Filled 2018-03-16: qty 2

## 2018-03-16 MED ORDER — HYDROXYZINE HCL 25 MG PO TABS: 25.0000 mg | ORAL_TABLET | Freq: Three times a day (TID) | ORAL | Status: DC | PRN

## 2018-03-16 MED ORDER — IBUPROFEN 800 MG PO TABS
800.0000 mg | ORAL_TABLET | Freq: Three times a day (TID) | ORAL | Status: DC
  Administered 2018-03-16 – 2018-03-19 (×7): 800 mg via ORAL
  Filled 2018-03-16 (×16): qty 1

## 2018-03-16 MED ORDER — TRAZODONE HCL 50 MG PO TABS
50.0000 mg | ORAL_TABLET | Freq: Every evening | ORAL | Status: DC | PRN
  Administered 2018-03-16 – 2018-03-17 (×2): 50 mg via ORAL
  Filled 2018-03-16 (×2): qty 1

## 2018-03-16 MED ORDER — ACETAMINOPHEN 325 MG PO TABS
650.0000 mg | ORAL_TABLET | Freq: Four times a day (QID) | ORAL | Status: DC | PRN
  Administered 2018-03-16 – 2018-03-21 (×3): 650 mg via ORAL
  Filled 2018-03-16 (×3): qty 2

## 2018-03-16 MED ORDER — CLONAZEPAM 0.5 MG PO TABS
0.2500 mg | ORAL_TABLET | Freq: Two times a day (BID) | ORAL | Status: DC | PRN
  Filled 2018-03-16: qty 1

## 2018-03-16 MED ORDER — ALUM & MAG HYDROXIDE-SIMETH 200-200-20 MG/5ML PO SUSP: 30.0000 mL | ORAL | Status: DC | PRN

## 2018-03-16 MED ORDER — MAGNESIUM HYDROXIDE 400 MG/5ML PO SUSP: 30.0000 mL | Freq: Every day | ORAL | Status: DC | PRN

## 2018-03-16 NOTE — ED Notes (Signed)
Affect flat, mood depressed, tearful at times. Able to complete ADLs and took shower. Lives with husband and 31 year old child. Guarded about talking about the cause of her depression. Has some history of depression.

## 2018-03-16 NOTE — Tx Team (Signed)
Initial Treatment Plan 03/16/2018 5:49 PM ROSEALYN LITTLE ZOX:[id number]    PATIENT STRESSORS: Health problems Marital or family conflict Medication change or noncompliance   PATIENT STRENGTHS: Capable of independent living Psychologist, counselling means Religious Affiliation Supportive family/friends   PATIENT IDENTIFIED PROBLEMS: Risk for suicide / self harm "I'm just tired of life" (Recent overdose on Xenax)  Alteration in mood (Increased depression & increased anxiety)  Ineffective coping skills (poor appetite, tearfulness, hopelessness)                 DISCHARGE CRITERIA:  Improved stabilization in mood, thinking, and/or behavior Verbal commitment to aftercare and medication compliance  PRELIMINARY DISCHARGE PLAN: Outpatient therapy Return to previous work or school arrangements  PATIENT/FAMILY INVOLVEMENT: This treatment plan has been presented to and reviewed with the patient, Markayla Reichart. The patient have been given the opportunity to ask questions and make suggestions.  Sherryl Manges, RN 03/16/2018, 5:49 PM

## 2018-03-16 NOTE — Consult Note (Signed)
[facility] Consult   Reason for Consult:  Suicide attempt by overdose.  Referring Physician:  EDP  Patient Identification: Diana Roman MRN:  [id number] Principal Diagnosis: Suicide attempt [facility]) Diagnosis:   Patient Active Problem List   Diagnosis Date Noted  . Suicide attempt [facility]) [T14.91XA]     Total Time spent with patient: 30 minutes  Subjective:   Diana Roman is a 32 y.o. female patient admitted with suicide attempt.  HPI:   Diana Roman presents to the hospital after suicide attempt by overdose with Xanax and Nortriptyline. She reports that she wants to go home but continues to endorse SI. She reports that she has not been taking her medications due to losing her insurance. She reports that she is "just tired." She denies overdosing on Nortriptyline but does report overdosing on 14 tablets of Xanax. She denies a prior history of suicide attempts or psychiatric hospitalizations. She will not elaborate about her current stressors.   Past Psychiatric History: Depression, Bipolar I disorder and anxiety.   Risk to Self: Suicidal Ideation: Yes-Currently Present Suicidal Intent: Yes-Currently Present Is patient at risk for suicide?: Yes Suicidal Plan?: Yes-Currently Present Specify Current Suicidal Plan: overdose Access to Means: Yes Specify Access to Suicidal Means: prescription pills What has been your use of drugs/alcohol within the last 12 months?: none reported How many times?: (UTA) Other Self Harm Risks: none reported Triggers for Past Attempts: Unknown Intentional Self Injurious Behavior: (UTA) Risk to Others: Homicidal Ideation: No Thoughts of Harm to Others: No Current Homicidal Intent: No Current Homicidal Plan: No Access to Homicidal Means: No Identified Victim: none History of harm to others?: No Assessment of Violence: None Noted Violent Behavior Description: none Does patient have access to weapons?: No Criminal Charges Pending?: No Does  patient have a court date: No Prior Inpatient Therapy: Prior Inpatient Therapy: No Prior Outpatient Therapy: Prior Outpatient Therapy: Yes Prior Therapy Dates: ongoing Prior Therapy Facilty/Provider(s): Dr. Robina Ade Reason for Treatment: bipolar disorder Does patient have an ACCT team?: No Does patient have Intensive In-House Services?  : No Does patient have [facility] services? : No Does patient have P4CC services?: No  Past Medical History:  Past Medical History:  Diagnosis Date  . Abnormal Pap smear 2011  . Anxiety   . Bipolar 1 disorder ([place])   . Depression    on meds, doing well  . H/O bacterial infection   . H/O varicella   . Headache(784.0)    Frequent  . Hypertension   . Infection    UTI  . Migraine   . No pertinent past medical history   . Sickle cell trait ([place])   . Vaginal Pap smear, abnormal    repeat was normal  . Yeast infection     Past Surgical History:  Procedure Laterality Date  . DENTAL SURGERY    . NO PAST SURGERIES     Family History:  Family History  Problem Relation Age of Onset  . Hypertension Mother   . Diabetes Maternal Grandmother   . Glaucoma Maternal Grandmother   . Hypertension Sister    Family Psychiatric  History: None per chart review.  Social History:  Social History   Substance and Sexual Activity  Alcohol Use Yes   Comment: 2/wk     Social History   Substance and Sexual Activity  Drug Use No    Social History   Socioeconomic History  . Marital status: Married    Spouse name: Not on file  .  Number of children: Not on file  . Years of education: Not on file  . Highest education level: Not on file  Occupational History  . Not on file  Social Needs  . Financial resource strain: Not on file  . Food insecurity:    Worry: Not on file    Inability: Not on file  . Transportation needs:    Medical: Not on file    Non-medical: Not on file  Tobacco Use  . Smoking status: Never Smoker  . Smokeless tobacco: Never Used   Substance and Sexual Activity  . Alcohol use: Yes    Comment: 2/wk  . Drug use: No  . Sexual activity: Yes    Birth control/protection: None  Lifestyle  . Physical activity:    Days per week: Not on file    Minutes per session: Not on file  . Stress: Not on file  Relationships  . Social connections:    Talks on phone: Not on file    Gets together: Not on file    Attends religious service: Not on file    Active member of club or organization: Not on file    Attends meetings of clubs or organizations: Not on file    Relationship status: Not on file  Other Topics Concern  . Not on file  Social History Narrative  . Not on file   Additional Social History: She lives at home with her husband and daughter.     Allergies:  No Known Allergies  Labs:  Results for orders placed or performed during the hospital encounter of 03/15/18 (from the past 48 hour(s))  Comprehensive metabolic panel     Status: Abnormal   Collection Time: 03/15/18 12:46 PM  Result Value Ref Range   Sodium 139 135 - 145 mmol/L   Potassium 3.4 (L) 3.5 - 5.1 mmol/L   Chloride 106 98 - 111 mmol/L   CO2 25 22 - 32 mmol/L   Glucose, Bld 72 70 - 99 mg/dL   BUN 16 6 - 20 mg/dL   Creatinine, Ser 0.87 0.44 - 1.00 mg/dL   Calcium 9.1 8.9 - 10.3 mg/dL   Total Protein 8.2 (H) 6.5 - 8.1 g/dL   Albumin 4.2 3.5 - 5.0 g/dL   AST 18 15 - 41 U/L   ALT 19 0 - 44 U/L   Alkaline Phosphatase 54 38 - 126 U/L   Total Bilirubin 0.8 0.3 - 1.2 mg/dL   GFR calc non Af Amer >60 >60 mL/min   GFR calc Af Amer >60 >60 mL/min    Comment: (NOTE) The eGFR has been calculated using the CKD EPI equation. This calculation has not been validated in all clinical situations. eGFR's persistently <60 mL/min signify possible Chronic Kidney Disease.    Anion gap 8 5 - 15    Comment: Performed at [facility], [place] [address] [place], [place] [address]  Ethanol     Status: Abnormal   Collection Time: 03/15/18 12:46 PM   Result Value Ref Range   Alcohol, Ethyl (B) 65 (H) <10 mg/dL    Comment: (NOTE) Lowest detectable limit for serum alcohol is 10 mg/dL. For medical purposes only. Performed at [facility], [place] [address], [place], [place] [address]   Salicylate level     Status: None   Collection Time: 03/15/18 12:46 PM  Result Value Ref Range   Salicylate Lvl <4.4 2.8 - 30.0 mg/dL    Comment: Performed at [facility], [place] [address], [place], [place] [address]  Acetaminophen level     Status: Abnormal   Collection Time: 03/15/18 12:46 PM  Result Value Ref Range   Acetaminophen (Tylenol), Serum <10 (L) 10 - 30 ug/mL    Comment: (NOTE) Therapeutic concentrations vary significantly. A range of 10-30 ug/mL  may be an effective concentration for many patients. However, some  are best treated at concentrations outside of this range. Acetaminophen concentrations >150 ug/mL at 4 hours after ingestion  and >50 ug/mL at 12 hours after ingestion are often associated with  toxic reactions. Performed at [facility], [place] [address], [place], [place] [address]   cbc     Status: None   Collection Time: 03/15/18 12:46 PM  Result Value Ref Range   WBC 5.6 4.0 - 10.5 K/uL   RBC 4.24 3.87 - 5.11 MIL/uL   Hemoglobin 12.0 12.0 - 15.0 g/dL   HCT 39.0 36.0 - 46.0 %   MCV 92.0 80.0 - 100.0 fL   MCH 28.3 26.0 - 34.0 pg   MCHC 30.8 30.0 - 36.0 g/dL   RDW 15.2 11.5 - 15.5 %   Platelets 238 150 - 400 K/uL   nRBC 0.0 0.0 - 0.2 %    Comment: Performed at [facility], [place] [address], [place], [place] [address]  Rapid urine drug screen (hospital performed)     Status: Abnormal   Collection Time: 03/15/18 12:51 PM  Result Value Ref Range   Opiates NONE DETECTED NONE DETECTED   Cocaine NONE DETECTED NONE DETECTED   Benzodiazepines POSITIVE (A) NONE DETECTED   Amphetamines NONE DETECTED NONE DETECTED   Tetrahydrocannabinol NONE DETECTED  NONE DETECTED   Barbiturates NONE DETECTED NONE DETECTED    Comment: (NOTE) DRUG SCREEN FOR MEDICAL PURPOSES ONLY.  IF CONFIRMATION IS NEEDED FOR ANY PURPOSE, NOTIFY LAB WITHIN 5 DAYS. LOWEST DETECTABLE LIMITS FOR URINE DRUG SCREEN Drug Class                     Cutoff (ng/mL) Amphetamine and metabolites    1000 Barbiturate and metabolites    200 Benzodiazepine                 099 Tricyclics and metabolites     300 Opiates and metabolites        300 Cocaine and metabolites        300 THC                            50 Performed at [facility], [place] [address] [place], [place] [address]   I-Stat beta hCG blood, ED     Status: None   Collection Time: 03/15/18 12:56 PM  Result Value Ref Range   I-stat hCG, quantitative <5.0 <5 mIU/mL   Comment 3            Comment:   GEST. AGE      CONC.  (mIU/mL)   <=1 WEEK        5 - 50     2 WEEKS       50 - 500     3 WEEKS       100 - 10,000     4 WEEKS     1,000 - 30,000        FEMALE AND NON-PREGNANT FEMALE:     LESS THAN 5 mIU/mL   Acetaminophen level  Status: Abnormal   Collection Time: 03/15/18  4:32 PM  Result Value Ref Range   Acetaminophen (Tylenol), Serum <10 (L) 10 - 30 ug/mL    Comment: (NOTE) Therapeutic concentrations vary significantly. A range of 10-30 ug/mL  may be an effective concentration for many patients. However, some  are best treated at concentrations outside of this range. Acetaminophen concentrations >150 ug/mL at 4 hours after ingestion  and >50 ug/mL at 12 hours after ingestion are often associated with  toxic reactions. Performed at [facility], [place] [address], [place], [place] [address]     Current Facility-Administered Medications  Medication Dose Route Frequency Provider Last Rate Last Dose  . clonazePAM (KLONOPIN) tablet 1 mg  1 mg Oral BID PRN Drenda Freeze, MD      . ibuprofen (ADVIL,MOTRIN) tablet 800 mg  800 mg Oral TID Drenda Freeze, MD   800  mg at 03/16/18 1046  . lamoTRIgine (LAMICTAL) tablet 200 mg  200 mg Oral Daily Drenda Freeze, MD   200 mg at 03/16/18 1045  . LORazepam (ATIVAN) tablet 1 mg  1 mg Oral Q4H PRN Drenda Freeze, MD       Current Outpatient Medications  Medication Sig Dispense Refill  . ALPRAZolam (XANAX) 0.5 MG tablet Take 0.5 mg by mouth at bedtime as needed for anxiety.    . clonazePAM (KLONOPIN) 1 MG tablet Take 1 mg by mouth 2 (two) times daily as needed for anxiety.     . famotidine (PEPCID) 20 MG tablet Take 1 tablet (20 mg total) by mouth 2 (two) times daily. 15 tablet 0  . ibuprofen (ADVIL,MOTRIN) 800 MG tablet Take 1 tablet (800 mg total) by mouth 3 (three) times daily. 21 tablet 0  . lamoTRIgine (LAMICTAL) 200 MG tablet Take 200 mg by mouth daily.    . Lurasidone HCl (LATUDA PO) Take by mouth.    . methocarbamol (ROBAXIN) 500 MG tablet Take 1 tablet (500 mg total) by mouth 2 (two) times daily as needed for muscle spasms. 20 tablet 0  . naproxen (NAPROSYN) 500 MG tablet Take 1 tablet (500 mg total) by mouth 2 (two) times daily. 14 tablet 0  . nitrofurantoin, macrocrystal-monohydrate, (MACROBID) 100 MG capsule Take 1 capsule (100 mg total) by mouth 2 (two) times daily. 14 capsule 0  . nortriptyline (PAMELOR) 50 MG capsule Take 50 mg by mouth at bedtime.    . Vitamin D, Ergocalciferol, (DRISDOL) 50000 units CAPS capsule Take 50,000 Units by mouth 2 (two) times a week.  99    Musculoskeletal: Strength & Muscle Tone: within normal limits Gait & Station: UTA since patient is lying in bed. Patient leans: N/A  Psychiatric Specialty Exam: Physical Exam  Nursing note and vitals reviewed. Constitutional: She is oriented to person, place, and time. She appears well-developed and well-nourished.  HENT:  Head: Normocephalic and atraumatic.  Neck: Normal range of motion.  Respiratory: Effort normal.  Musculoskeletal: Normal range of motion.  Neurological: She is alert and oriented to person, place,  and time.  Psychiatric: Her speech is normal and behavior is normal. Judgment normal. Cognition and memory are normal. She exhibits a depressed mood. She expresses suicidal ideation. She expresses suicidal plans.    Review of Systems  Psychiatric/Behavioral: Positive for depression and suicidal ideas.  All other systems reviewed and are negative.   Blood pressure (!) 145/100, pulse 99, temperature (!) 97.4 F (36.3 C), temperature source Oral, resp. rate (!) 30, height '5\' 6"'$  (  1.676 m), weight 106 kg, last menstrual period 03/14/2018, SpO2 98 %.Body mass index is 37.72 kg/m.  General Appearance: Fairly Groomed, young, African American female, wearing paper hospital scrubs with short hair who is sitting in bed. NAD.  Eye Contact:  Good  Speech:  Clear and Coherent and Normal Rate  Volume:  Normal  Mood:  Depressed  Affect:  Congruent and Flat  Thought Process:  Goal Directed, Linear and Descriptions of Associations: Intact  Orientation:  Full (Time, Place, and Person)  Thought Content:  Logical  Suicidal Thoughts:  Yes.  with intent/plan  Homicidal Thoughts:  No  Memory:  Immediate;   Good Recent;   Good Remote;   Good  Judgement:  Fair  Insight:  Fair  Psychomotor Activity:  Normal  Concentration:  Concentration: Good and Attention Span: Good  Recall:  Good  Fund of Knowledge:  Good  Language:  Good  Akathisia:  No  Handed:  Right  AIMS (if indicated):   N/A  Assets:  Communication Skills Physical Health Social Support  ADL's:  Intact  Cognition:  WNL  Sleep:   N/A    Assessment:  Diana WOODSIDE is a 32 y.o. female who was admitted with suicide attempt by drug overdose. She continues to endorse SI. She has a flat affect and alogia. She warrants inpatient psychiatric hospitalization for stabilization and treatment.   Treatment Plan Summary: Daily contact with patient to assess and evaluate symptoms and progress in treatment and Medication management  -Continue Lamictal 200  mg daily for mood stabilization.  -Continue Klonopin 0.25 mg BID PRN (in lieu of home Xanax) for anxiety.    Disposition: Recommend psychiatric Inpatient admission when medically cleared.  Faythe Dingwall, DO 03/16/2018 11:12 AM

## 2018-03-16 NOTE — ED Notes (Addendum)
At approx. 8 Ms Newcombe attempted to strangle self with coban drsg that she removed from her arm. The drsg was placed prior to arrival to TCU d/t left wrist injury and pain. Staff quickly came to bedside while MS Zaccaro was attempting to tighten drsg around her neck she cough and the drsg was released by staff and EDP and ED charge nurse were notified. Ms Wilbon was examined by Dr Read Drivers and no harm to pt was noted. Lungs clear no stridor or change in voice noted. Ms Bartolome repeatedly stated "leave me alone I don't want to live let me die". Affect flat mood labile with open verbalization of suicidal intentions

## 2018-03-16 NOTE — BH Assessment (Signed)
[facility] Assessment Progress Note     03/16/18 Per Roosvelt Harps, DO and Elta Guadeloupe FNP, patient meets inpatient admission criteria and will need a [facility] 400 Hall Type Bed.

## 2018-03-16 NOTE — ED Notes (Signed)
Transported to [facility] by [employer]. Pt was calm and cooperative. All belongings were given to [employer] for pt.
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
## 2018-03-16 NOTE — Progress Notes (Signed)
Adult Psychoeducational Group Note  Date:  03/16/2018 Time:  9:10 PM  Group Topic/Focus:  Wrap-Up Group:   The focus of this group is to help patients review their daily goal of treatment and discuss progress on daily workbooks.  Participation Level:  Minimal  Participation Quality:  Resistant  Affect:  Flat  Cognitive:  Alert and Oriented  Insight: Appropriate  Engagement in Group:  Developing/Improving  Modes of Intervention:  Clarification, Exploration and Support  Additional Comments:  Pt verbalized that something positive is that she was able to see her husband and daughter. Patient also verbalized that she was able to get her ring back and a family picture.   Nelli Swalley, Randal Buba 03/16/2018, 9:10 PM

## 2018-03-16 NOTE — BH Assessment (Signed)
[facility] Assessment Progress Note  Per Juanetta Beets, DO, this pt requires psychiatric hospitalization.  Thurman Coyer, RN, [facility] has assigned pt to [facility] Rm 300-1.  Pt presents under IVC initiated by EDP Alona Bene, MD, and IVC documents have been faxed to [facility].  Pt's nurse, Diane, has been notified, and agrees to call report to [phone].  Pt is to be transported via [employer].   Doylene Canning, [place] [facility] Coordinator [phone]

## 2018-03-16 NOTE — ED Notes (Signed)
Report called to [facility]. [employer] called for transport.

## 2018-03-16 NOTE — Progress Notes (Signed)
D: Pt was in her room with visitors upon initial approach.  Pt presents with depressed affect and mood.  When asked how her day has been, she states "good."  Pt forwards little information.  She reports she had a good visit with her husband and daughter tonight.  Pt is tearful at times.  Pt denies SI/HI, denies hallucinations, reports pain from headache of 7/10.  Pt has been visible in milieu interacting with peers and staff cautiously.  Pt attended evening group.    A: Introduced self to pt.  Met with pt 1:1.  Actively listened to pt and offered support and encouragement.  PRN medication administered for pain and sleep.  Q15 minute safety checks maintained.  R: Pt is safe on the unit.  Pt is compliant with medications.  Pt verbally contracts for safety.  Will continue to monitor and assess.

## 2018-03-16 NOTE — ED Notes (Signed)
Pt alert and oriented. Pt noted with a flat affect. Pt denies any si,hi and avh. Pt sitting on the side of the bed looking at the wall. Pt stated she would let nurse know if anything changes. Will continue to monitor.

## 2018-03-17 DIAGNOSIS — G4700 Insomnia, unspecified: Secondary | ICD-10-CM

## 2018-03-17 DIAGNOSIS — F3181 Bipolar II disorder: Secondary | ICD-10-CM

## 2018-03-17 DIAGNOSIS — F419 Anxiety disorder, unspecified: Secondary | ICD-10-CM

## 2018-03-17 MED ORDER — LOPERAMIDE HCL 2 MG PO CAPS: 2.0000 mg | ORAL_CAPSULE | ORAL | Status: AC | PRN

## 2018-03-17 MED ORDER — HYDROXYZINE HCL 25 MG PO TABS
25.0000 mg | ORAL_TABLET | Freq: Four times a day (QID) | ORAL | Status: DC | PRN
  Administered 2018-03-18: 25 mg via ORAL
  Filled 2018-03-17: qty 1

## 2018-03-17 MED ORDER — LORAZEPAM 1 MG PO TABS
1.0000 mg | ORAL_TABLET | Freq: Four times a day (QID) | ORAL | Status: AC | PRN
  Filled 2018-03-17: qty 1

## 2018-03-17 MED ORDER — ONDANSETRON 4 MG PO TBDP
4.0000 mg | ORAL_TABLET | Freq: Four times a day (QID) | ORAL | Status: AC | PRN
  Administered 2018-03-18 (×2): 4 mg via ORAL
  Filled 2018-03-17 (×2): qty 1

## 2018-03-17 MED ORDER — LURASIDONE HCL 20 MG PO TABS
20.0000 mg | ORAL_TABLET | Freq: Every day | ORAL | Status: DC
  Administered 2018-03-18 – 2018-03-21 (×4): 20 mg via ORAL
  Filled 2018-03-17 (×5): qty 1

## 2018-03-17 MED ORDER — LAMOTRIGINE 25 MG PO TABS
25.0000 mg | ORAL_TABLET | Freq: Every day | ORAL | Status: DC
  Administered 2018-03-18 – 2018-03-21 (×4): 25 mg via ORAL
  Filled 2018-03-17 (×5): qty 1

## 2018-03-17 MED ORDER — ADULT MULTIVITAMIN W/MINERALS CH
1.0000 | ORAL_TABLET | Freq: Every day | ORAL | Status: DC
  Administered 2018-03-17 – 2018-03-21 (×5): 1 via ORAL
  Filled 2018-03-17 (×7): qty 1

## 2018-03-17 MED ORDER — SERTRALINE HCL 25 MG PO TABS
25.0000 mg | ORAL_TABLET | Freq: Every day | ORAL | Status: DC
  Administered 2018-03-17 – 2018-03-20 (×4): 25 mg via ORAL
  Filled 2018-03-17 (×6): qty 1

## 2018-03-17 MED ORDER — VITAMIN B-1 100 MG PO TABS
100.0000 mg | ORAL_TABLET | Freq: Every day | ORAL | Status: DC
  Administered 2018-03-18: 100 mg via ORAL
  Filled 2018-03-17 (×5): qty 1

## 2018-03-17 NOTE — Progress Notes (Signed)
Recreation Therapy Notes  Date: 11.1.19 Time: 0930 Location: 300 Hall Dayroom  Group Topic: Stress Management  Goal Area(s) Addresses:  Patient will verbalize importance of using healthy stress management.  Patient will identify positive emotions associated with healthy stress management.   Intervention: Stress Management  Activity :  Guided Imagery.  LRT introduced the stress management technique of guided imagery.  LRT read a script allowing patients to visualize being in a peaceful meadow.  Patients were to listen as script was read to engage in the meditation.  Education:  Stress Management, Discharge Planning.   Education Outcome: Acknowledges edcuation/In group clarification offered/Needs additional education  Clinical Observations/Feedback: Pt did not attend group.      Miel Wisener, LRT/CTRS         Josey Forcier A 03/17/2018 11:38 AM 

## 2018-03-17 NOTE — [facility] Counselor (Signed)
Adult Comprehensive Assessment  Patient ID: Diana Roman, female   DOB: 12-Nov-1985, 32 y.o.   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   Employment / Job issues: Pt is not getting enough hours at work. Family Relationships: Husband told her on Monday that he is leaving.   Financial / Lack of resources (include bankruptcy): Financial stress due to lack of hours.   Living/Environment/Situation:  Living Arrangements: Spouse/significant other, Children Living conditions (as described by patient or guardian): goes OK, safe Who else lives in the home?: husband, daughter How long has patient lived in current situation?: 4 months What is atmosphere in current home: Comfortable  Family History:  Marital status: Married Number of Years Married: 8 What types of issues is patient dealing with in the relationship?: Husband just told pt he is leaving the marriage.  Pt thought things were going OK. Are you sexually active?: No What is your sexual orientation?: heterosexual Has your sexual activity been affected by drugs, alcohol, medication, or emotional stress?: na Does patient have children?: Yes How many children?: 1 How is patient's relationship with their children?: 43 year old daughter. "excellent" relationship  Childhood History:  By whom was/is the patient raised?: Mother/father and step-parent Additional childhood history information: Parents split up when pt was young, step father "in my life" since I was 6.  Good childhood.  Lived with sister after age 41 because mom "was tired of me" Description of patient's relationship with  caregiver when they were a child: mom: good until teen years, dad: non contact.  step father: close Patient's description of current relationship with people who raised him/her: mom: "I don't like her too much",  dad: no contact, step dad: has dementia How were you disciplined when you got in trouble as a child/adolescent?: appropriate discipline Does patient have siblings?: Yes Number of Siblings: 6 Description of patient's current relationship with siblings: 2 in [place], 2 in [place], 2 in [place].  Good relationships with brother and sister in [place] Did patient suffer any verbal/emotional/physical/sexual abuse as a child?: No Did patient suffer from severe childhood neglect?: No Has patient ever been sexually abused/assaulted/raped as an adolescent or adult?: No Was the patient ever a victim of a crime or a disaster?: No Witnessed domestic violence?: No Has patient been effected by domestic violence as an adult?: No  Education:  Highest grade of school patient has completed: HS diploma, some college, just completed CNA program Currently a student?: Yes Name of school: [employer] How long has the patient attended?: 1 year-CNA program Learning disability?: No  Employment/Work Situation:   Employment situation: Employed 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?: No Are There Guns or Other Weapons in Your Home?: Yes Types of Guns/Weapons: husband has handgun Are These Comptroller?: No Who Could Verify You Are Able To Have  These Secured:: husband keeps it in his Tour manager Resources:   Financial resources: Income from employment, Income  from spouse, Medicaid Does patient have a representative payee or guardian?: No  Alcohol/Substance Abuse:   What has been your use of drugs/alcohol within the last 12 months?: alcohol: 1x week, 1 glass wine, pt denies drug use If attempted suicide, did drugs/alcohol play a role in this?: Yes(pt was drinking when she attempted suicide) Alcohol/Substance Abuse Treatment Hx: Denies past history Has alcohol/substance abuse ever caused legal problems?: No  Social Support System:   Patient's Community Support System: Good Describe Community Support System: sister, brother, husband Type of faith/religion: [employer] How does patient's faith help to cope with current illness?: I pray, study the Bible  Leisure/Recreation:   Leisure and Hobbies: movies  Strengths/Needs:   What is the patient's perception of their strengths?: planning, shopping, listening Patient states they can use these personal strengths during their treatment to contribute to their recovery: staying busy keeps my mind off depression Patient states these barriers may affect/interfere with their treatment: none Patient states these barriers may affect their return to the community: none Other important information patient would like considered in planning for their treatment: none  Discharge Plan:   Currently receiving community mental health services: No Patient states concerns and preferences for aftercare planning are: Willing to follow up but does not have current provider Patient states they will know when they are safe and ready for discharge when: Pt feels like she is safe now. Does patient have access to transportation?: Yes Does patient have financial barriers related to discharge medications?: No Will patient be returning to same living situation after discharge?: Yes  Summary/Recommendations:   Summary and Recommendations (to be completed by the evaluator): Pt is 32 year old female from [place].  Pt is diagnosed  with major depressive disorder and was admitted after a suicide attempt by overdose.  Pt reports stressors including financial and marital.  Recommendations for pt include crisis stabilizaiton, therapeutic milieu, attend and participate in groups, medication management, and development of comprehensive mental wellness plan.  Lorri Frederick. 03/17/2018

## 2018-03-17 NOTE — Plan of Care (Signed)
  Problem: Education: Goal: Emotional status will improve Outcome: Not Progressing   

## 2018-03-17 NOTE — Tx Team (Signed)
Interdisciplinary Treatment and Diagnostic Plan Update  03/17/2018 Time of Session:  Diana Roman MRN: [id number]  Principal Diagnosis: <principal problem not specified>  Secondary Diagnoses: Active Problems:   MDD (major depressive disorder), recurrent severe, without psychosis ([place])   Current Medications:  Current Facility-Administered Medications  Medication Dose Route Frequency Provider Last Rate Last Dose  . acetaminophen (TYLENOL) tablet 650 mg  650 mg Oral Q6H PRN Ethelene Hal, NP   650 mg at 03/16/18 2108  . alum & mag hydroxide-simeth (MAALOX/MYLANTA) 200-200-20 MG/5ML suspension 30 mL  30 mL Oral Q4H PRN Ethelene Hal, NP      . clonazePAM Bobbye [place]) tablet 0.25 mg  0.25 mg Oral BID PRN Ethelene Hal, NP      . hydrOXYzine (ATARAX/VISTARIL) tablet 25 mg  25 mg Oral TID PRN Ethelene Hal, NP      . ibuprofen (ADVIL,MOTRIN) tablet 800 mg  800 mg Oral TID Ethelene Hal, NP   800 mg at 03/17/18 0800  . lamoTRIgine (LAMICTAL) tablet 200 mg  200 mg Oral Daily Ethelene Hal, NP   200 mg at 03/17/18 0800  . magnesium hydroxide (MILK OF MAGNESIA) suspension 30 mL  30 mL Oral Daily PRN Ethelene Hal, NP      . traZODone (DESYREL) tablet 50 mg  50 mg Oral QHS PRN Ethelene Hal, NP   50 mg at 03/16/18 2108   PTA Medications: Medications Prior to Admission  Medication Sig Dispense Refill Last Dose  . ALPRAZolam (XANAX) 0.5 MG tablet Take 0.5 mg by mouth at bedtime as needed for anxiety.   03/15/2018 at Unknown time  . clonazePAM (KLONOPIN) 1 MG tablet Take 1 mg by mouth 2 (two) times daily as needed for anxiety.    08/18/2015  . famotidine (PEPCID) 20 MG tablet Take 1 tablet (20 mg total) by mouth 2 (two) times daily. 15 tablet 0   . ibuprofen (ADVIL,MOTRIN) 800 MG tablet Take 1 tablet (800 mg total) by mouth 3 (three) times daily. 21 tablet 0   . lamoTRIgine (LAMICTAL) 200 MG tablet Take 200 mg by mouth daily.   08/18/2015  .  Lurasidone HCl (LATUDA PO) Take by mouth.   08/18/2015  . methocarbamol (ROBAXIN) 500 MG tablet Take 1 tablet (500 mg total) by mouth 2 (two) times daily as needed for muscle spasms. 20 tablet 0 08/18/2015  . naproxen (NAPROSYN) 500 MG tablet Take 1 tablet (500 mg total) by mouth 2 (two) times daily. 14 tablet 0   . nitrofurantoin, macrocrystal-monohydrate, (MACROBID) 100 MG capsule Take 1 capsule (100 mg total) by mouth 2 (two) times daily. 14 capsule 0 11/27/2014 at Unknown time  . nortriptyline (PAMELOR) 50 MG capsule Take 50 mg by mouth at bedtime.   08/18/2015  . Vitamin D, Ergocalciferol, (DRISDOL) 50000 units CAPS capsule Take 50,000 Units by mouth 2 (two) times a week.  99     Patient Stressors: Health problems Marital or family conflict Medication change or noncompliance  Patient Strengths: Capable of independent living Warehouse manager means Religious Affiliation Supportive family/friends  Treatment Modalities: Medication Management, Group therapy, Case management,  1 to 1 session with clinician, Psychoeducation, Recreational therapy.   Physician Treatment Plan for Primary Diagnosis: <principal problem not specified> Long Term Goal(s):     Short Term Goals:    Medication Management: Evaluate patient's response, side effects, and tolerance of medication regimen.  Therapeutic Interventions: 1 to 1 sessions, Unit Group sessions and Medication administration.  Evaluation of Outcomes: Not Met  Physician Treatment Plan for Secondary Diagnosis: Active Problems:   MDD (major depressive disorder), recurrent severe, without psychosis ([place])  Long Term Goal(s):     Short Term Goals:       Medication Management: Evaluate patient's response, side effects, and tolerance of medication regimen.  Therapeutic Interventions: 1 to 1 sessions, Unit Group sessions and Medication administration.  Evaluation of Outcomes: Not Met   RN Treatment Plan for Primary Diagnosis:  <principal problem not specified> Long Term Goal(s): Knowledge of disease and therapeutic regimen to maintain health will improve  Short Term Goals: Ability to participate in decision making will improve, Ability to verbalize feelings will improve, Ability to disclose and discuss suicidal ideas, Ability to identify and develop effective coping behaviors will improve and Compliance with prescribed medications will improve  Medication Management: RN will administer medications as ordered by provider, will assess and evaluate patient's response and provide education to patient for prescribed medication. RN will report any adverse and/or side effects to prescribing provider.  Therapeutic Interventions: 1 on 1 counseling sessions, Psychoeducation, Medication administration, Evaluate responses to treatment, Monitor vital signs and CBGs as ordered, Perform/monitor CIWA, COWS, AIMS and Fall Risk screenings as ordered, Perform wound care treatments as ordered.  Evaluation of Outcomes: Not Met   LCSW Treatment Plan for Primary Diagnosis: <principal problem not specified> Long Term Goal(s): Safe transition to appropriate next level of care at discharge, Engage patient in therapeutic group addressing interpersonal concerns.  Short Term Goals: Engage patient in aftercare planning with referrals and resources  Therapeutic Interventions: Assess for all discharge needs, 1 to 1 time with Social worker, Explore available resources and support systems, Assess for adequacy in community support network, Educate family and significant other(s) on suicide prevention, Complete Psychosocial Assessment, Interpersonal group therapy.  Evaluation of Outcomes: Not Met   Progress in Treatment: Attending groups: No. New to unit  Participating in groups: No. Taking medication as prescribed: Yes. Toleration medication: Yes. Family/Significant other contact made: No, will contact:  if patient provides consent for collateral  contacts Patient understands diagnosis: Yes. Discussing patient identified problems/goals with staff: Yes. Medical problems stabilized or resolved: Yes. Denies suicidal/homicidal ideation: Yes. Issues/concerns per patient self-inventory: No. Other:   New problem(s) identified:  None   New Short Term/Long Term Goal(s):medication stabilization, elimination of SI thoughts, development of comprehensive mental wellness plan.   Patient Goals:  I'm just tired of living.   Discharge Plan or Barriers: CSW will assess for appropriate referrals and possible discharge planning.   Reason for Continuation of Hospitalization: Depression Medication stabilization Suicidal ideation  Estimated Length of Stay: 3-5 days   Attendees: Patient: 03/17/2018 10:51 AM  Physician: Dr. Maris Berger, MD 03/17/2018 10:51 AM  Nursing: Chong Sicilian.D, RN 03/17/2018 10:51 AM  RN Care Manager: Lars Pinks, RN 03/17/2018 10:51 AM  Social Worker: Radonna Ricker, [place] 03/17/2018 10:51 AM  Recreational Therapist: Rhunette Croft 03/17/2018 10:51 AM  Other: Agustina Caroli, NP 03/17/2018 10:51 AM  Other:  03/17/2018 10:51 AM  Other: 03/17/2018 10:51 AM    Scribe for Treatment Team: Marylee Floras, [place] 03/17/2018 10:51 AM

## 2018-03-17 NOTE — H&P (Signed)
Psychiatric Admission Assessment Adult  Patient Identification: Diana Roman MRN:  [id number] Date of Evaluation:  03/17/2018 Chief Complaint:  "I tried to kill myself" Principal Diagnosis: Bipolar Disorder , Depressed versus MDD  Diagnosis:   Patient Active Problem List   Diagnosis Date Noted  . MDD (major depressive disorder), recurrent severe, without psychosis ([place]) [F33.2] 03/16/2018  . Suicide attempt ([place]) [T14.91XA]    History of Present Illness: 32 year old female who presented to ED via EMS after a passerby found her slumped over on a park bench after suicide attempt by overdosing on Xanax and alcohol . Reports she took about 14 tablets, which was " what was left in the bottle". Of note, is prescribed Xanax for anxiety, but states she takes only occasionally and denies any misuse or abuse. Also, she denies alcohol abuse, drinks only occasionally.  She reports she has been depressed x 2 months, and feels her mood has been worsening. Reports she had been experiencing passive SI recently but did not have plan or intention until that day. States " I was just tired of life".  Patient does not identify any specific triggers or stressors for worsening depression, States she has been off of her prescribed psychiatric medications since March 2019 due to financial /insurance constraints . Associated Signs/Symptoms: Depression Symptoms:  depressed mood, anhedonia, insomnia, feelings of worthlessness/guilt, hopelessness, suicidal attempt, panic attacks, loss of energy/fatigue, decreased appetite, (Hypo) Manic Symptoms:  irritability Anxiety Symptoms:  Does not endorse  Psychotic Symptoms:  None reported PTSD Symptoms: Negative Total Time spent with patient: 45 minutes  Past Psychiatric History: Denies history of previous psychiatric hospitalization. Denies prior history of prior suicide attempts, denies history of self cutting , denies history of psychosis. Reports diagnosed with  Bipolar Disorder  at age 32. Reports brief episodes of increased energy, decreased need for sleep, which last a few days.She describes prior episodes of depression. Has seen Dr. Robina Ade (psychiatrist) outpatient for medication management.  Reports history of occasional panic attacks, no agoraphobia, does not endorse  social anxiety Denies history of violence . Is the patient at risk to self? Yes.    Has the patient been a risk to self in the past 6 months? Yes.    Has the patient been a risk to self within the distant past? No.  Is the patient a risk to others? No.  Has the patient been a risk to others in the past 6 months? No.  Has the patient been a risk to others within the distant past? No.   Prior Inpatient Therapy:  Denies Prior Outpatient Therapy:  Dr. Robina Ade (medication management)  Alcohol Screening: Patient refused Alcohol Screening Tool: Yes 1. How often do you have a drink containing alcohol?: 2 to 3 times a week 2. How many drinks containing alcohol do you have on a typical day when you are drinking?: 3 or 4 3. How often do you have six or more drinks on one occasion?: Less than monthly AUDIT-C Score: 5 4. How often during the last year have you found that you were not able to stop drinking once you had started?: Never 5. How often during the last year have you failed to do what was normally expected from you becasue of drinking?: Never 6. How often during the last year have you needed a first drink in the morning to get yourself going after a heavy drinking session?: Never 7. How often during the last year have you had a feeling of guilt of  remorse after drinking?: Less than monthly 8. How often during the last year have you been unable to remember what happened the night before because you had been drinking?: Never 9. Have you or someone else been injured as a result of your drinking?: No 10. Has a relative or friend or a doctor or another health worker been concerned about your  drinking or suggested you cut down?: No Alcohol Use Disorder Identification Test Final Score (AUDIT): 6 Intervention/Follow-up: Brief Advice, Continued Monitoring, AUDIT Score <7 follow-up not indicated Substance Abuse History in the last 12 months: reports she drinks occasionally, about once a week. Denies alcohol or drug abuse . Consequences of Substance Abuse: Denies .  Previous Psychotropic Medications: States she stopped taking psychiatric medications in March 2019, due to insurance constraints . At the time was taking Lamictal ,Lithium,Nortripthyline ( for migraines ) . She had also been prescribed  Xanax PRN,which she states she was taking only occasionally. States she feels medications were helping, but were causing headaches .  She last took Xanax a week ago.  Psychological Evaluations: No  Past Medical History: reports recently diagnosed with HTN.  Past Medical History:  Diagnosis Date  . Abnormal Pap smear 2011  . Anxiety   . Bipolar 1 disorder ([place])   . Depression    on meds, doing well  . H/O bacterial infection   . H/O varicella   . Headache(784.0)    Frequent  . Hypertension   . Infection    UTI  . Migraine   . No pertinent past medical history   . Sickle cell trait ([place])   . Vaginal Pap smear, abnormal    repeat was normal  . Yeast infection     Past Surgical History:  Procedure Laterality Date  . DENTAL SURGERY    . NO PAST SURGERIES     Family History: Parents alive, live together, live in [place] .Father with dementia (diagnosed 1 year ago). She has 2 sisters and 4 brothers . Family History  Problem Relation Age of Onset  . Hypertension Mother   . Diabetes Maternal Grandmother   . Glaucoma Maternal Grandmother   . Hypertension Sister    Family Psychiatric  History: Mother has history of alcohol use disorder. Denies suicides in family. No history of mood disorder in family. Tobacco Screening: Have you used any form of tobacco in the last 30 days?  (Cigarettes, Smokeless Tobacco, Cigars, and/or Pipes): No Social History:  Married, 1 child (68 yr old daughter) whom she lives with. She is employed. Denies legal issues .  Social History   Substance and Sexual Activity  Alcohol Use Yes   Comment: 2/wk     Social History   Substance and Sexual Activity  Drug Use No    Additional Social History:   Allergies:  No Known Allergies Lab Results:  Results for orders placed or performed during the hospital encounter of 03/15/18 (from the past 48 hour(s))  Comprehensive metabolic panel     Status: Abnormal   Collection Time: 03/15/18 12:46 PM  Result Value Ref Range   Sodium 139 135 - 145 mmol/L   Potassium 3.4 (L) 3.5 - 5.1 mmol/L   Chloride 106 98 - 111 mmol/L   CO2 25 22 - 32 mmol/L   Glucose, Bld 72 70 - 99 mg/dL   BUN 16 6 - 20 mg/dL   Creatinine, Ser 0.87 0.44 - 1.00 mg/dL   Calcium 9.1 8.9 - 10.3 mg/dL   Total Protein  8.2 (H) 6.5 - 8.1 g/dL   Albumin 4.2 3.5 - 5.0 g/dL   AST 18 15 - 41 U/L   ALT 19 0 - 44 U/L   Alkaline Phosphatase 54 38 - 126 U/L   Total Bilirubin 0.8 0.3 - 1.2 mg/dL   GFR calc non Af Amer >60 >60 mL/min   GFR calc Af Amer >60 >60 mL/min    Comment: (NOTE) The eGFR has been calculated using the CKD EPI equation. This calculation has not been validated in all clinical situations. eGFR's persistently <60 mL/min signify possible Chronic Kidney Disease.    Anion gap 8 5 - 15    Comment: Performed at [facility], [place] [address] [place], [place] [address]  Ethanol     Status: Abnormal   Collection Time: 03/15/18 12:46 PM  Result Value Ref Range   Alcohol, Ethyl (B) 65 (H) <10 mg/dL    Comment: (NOTE) Lowest detectable limit for serum alcohol is 10 mg/dL. For medical purposes only. Performed at [facility], [place] [address], [place], [place] [address]   Salicylate level     Status: None   Collection Time: 03/15/18 12:46 PM  Result Value Ref Range   Salicylate  Lvl <2.3 2.8 - 30.0 mg/dL    Comment: Performed at [facility], [place] [address] [place], [place] [address]  Acetaminophen level     Status: Abnormal   Collection Time: 03/15/18 12:46 PM  Result Value Ref Range   Acetaminophen (Tylenol), Serum <10 (L) 10 - 30 ug/mL    Comment: (NOTE) Therapeutic concentrations vary significantly. A range of 10-30 ug/mL  may be an effective concentration for many patients. However, some  are best treated at concentrations outside of this range. Acetaminophen concentrations >150 ug/mL at 4 hours after ingestion  and >50 ug/mL at 12 hours after ingestion are often associated with  toxic reactions. Performed at [facility], [place] [address] [place], [place] [address]   cbc     Status: None   Collection Time: 03/15/18 12:46 PM  Result Value Ref Range   WBC 5.6 4.0 - 10.5 K/uL   RBC 4.24 3.87 - 5.11 MIL/uL   Hemoglobin 12.0 12.0 - 15.0 g/dL   HCT 39.0 36.0 - 46.0 %   MCV 92.0 80.0 - 100.0 fL   MCH 28.3 26.0 - 34.0 pg   MCHC 30.8 30.0 - 36.0 g/dL   RDW 15.2 11.5 - 15.5 %   Platelets 238 150 - 400 K/uL   nRBC 0.0 0.0 - 0.2 %    Comment: Performed at [facility], [place] [address] [place],  [address]  Rapid urine drug screen (hospital performed)     Status: Abnormal   Collection Time: 03/15/18 12:51 PM  Result Value Ref Range   Opiates NONE DETECTED NONE DETECTED   Cocaine NONE DETECTED NONE DETECTED   Benzodiazepines POSITIVE (A) NONE DETECTED   Amphetamines NONE DETECTED NONE DETECTED   Tetrahydrocannabinol NONE DETECTED NONE DETECTED   Barbiturates NONE DETECTED NONE DETECTED    Comment: (NOTE) DRUG SCREEN FOR MEDICAL PURPOSES ONLY.  IF CONFIRMATION IS NEEDED FOR ANY PURPOSE, NOTIFY LAB WITHIN 5 DAYS. LOWEST DETECTABLE LIMITS FOR URINE DRUG SCREEN Drug Class                     Cutoff (ng/mL) Amphetamine and metabolites    1000 Barbiturate and metabolites    200 Benzodiazepine  681 Tricyclics and metabolites     300 Opiates and metabolites        300 Cocaine and metabolites        300 THC                            50 Performed at [facility], [place] [address] [place], [place] [address]   I-Stat beta hCG blood, ED     Status: None   Collection Time: 03/15/18 12:56 PM  Result Value Ref Range   I-stat hCG, quantitative <5.0 <5 mIU/mL   Comment 3            Comment:   GEST. AGE      CONC.  (mIU/mL)   <=1 WEEK        5 - 50     2 WEEKS       50 - 500     3 WEEKS       100 - 10,000     4 WEEKS     1,000 - 30,000        FEMALE AND NON-PREGNANT FEMALE:     LESS THAN 5 mIU/mL   Acetaminophen level     Status: Abnormal   Collection Time: 03/15/18  4:32 PM  Result Value Ref Range   Acetaminophen (Tylenol), Serum <10 (L) 10 - 30 ug/mL    Comment: (NOTE) Therapeutic concentrations vary significantly. A range of 10-30 ug/mL  may be an effective concentration for many patients. However, some  are best treated at concentrations outside of this range. Acetaminophen concentrations >150 ug/mL at 4 hours after ingestion  and >50 ug/mL at 12 hours after ingestion are often associated with  toxic reactions. Performed at [facility], [place] [address], [place], [place] [address]     Blood Alcohol level:  Lab Results  Component Value Date   ETH 65 (H) 03/15/2018   ETH  04/18/2010    5        LOWEST DETECTABLE LIMIT FOR SERUM ALCOHOL IS 5 mg/dL FOR MEDICAL PURPOSES ONLY    Metabolic Disorder Labs:  No results found for: HGBA1C, MPG No results found for: PROLACTIN No results found for: CHOL, TRIG, HDL, CHOLHDL, VLDL, LDLCALC  Current Medications: Current Facility-Administered Medications  Medication Dose Route Frequency Provider Last Rate Last Dose  . acetaminophen (TYLENOL) tablet 650 mg  650 mg Oral Q6H PRN Ethelene Hal, NP   650 mg at 03/16/18 2108  . alum & mag hydroxide-simeth (MAALOX/MYLANTA)  200-200-20 MG/5ML suspension 30 mL  30 mL Oral Q4H PRN Ethelene Hal, NP      . clonazePAM Bobbye [place]) tablet 0.25 mg  0.25 mg Oral BID PRN Ethelene Hal, NP      . hydrOXYzine (ATARAX/VISTARIL) tablet 25 mg  25 mg Oral TID PRN Ethelene Hal, NP      . ibuprofen (ADVIL,MOTRIN) tablet 800 mg  800 mg Oral TID Ethelene Hal, NP   800 mg at 03/17/18 0800  . lamoTRIgine (LAMICTAL) tablet 200 mg  200 mg Oral Daily Ethelene Hal, NP   200 mg at 03/17/18 0800  . magnesium hydroxide (MILK OF MAGNESIA) suspension 30 mL  30 mL Oral Daily PRN Ethelene Hal, NP      . traZODone (DESYREL) tablet 50 mg  50 mg Oral QHS PRN Ethelene Hal, NP   50 mg at 03/16/18 2108   PTA  Medications: Medications Prior to Admission  Medication Sig Dispense Refill Last Dose  . ALPRAZolam (XANAX) 0.5 MG tablet Take 0.5 mg by mouth at bedtime as needed for anxiety.   03/15/2018 at Unknown time  . clonazePAM (KLONOPIN) 1 MG tablet Take 1 mg by mouth 2 (two) times daily as needed for anxiety.    08/18/2015  . famotidine (PEPCID) 20 MG tablet Take 1 tablet (20 mg total) by mouth 2 (two) times daily. 15 tablet 0   . ibuprofen (ADVIL,MOTRIN) 800 MG tablet Take 1 tablet (800 mg total) by mouth 3 (three) times daily. 21 tablet 0   . lamoTRIgine (LAMICTAL) 200 MG tablet Take 200 mg by mouth daily.   08/18/2015  . Lurasidone HCl (LATUDA PO) Take by mouth.   08/18/2015  . methocarbamol (ROBAXIN) 500 MG tablet Take 1 tablet (500 mg total) by mouth 2 (two) times daily as needed for muscle spasms. 20 tablet 0 08/18/2015  . naproxen (NAPROSYN) 500 MG tablet Take 1 tablet (500 mg total) by mouth 2 (two) times daily. 14 tablet 0   . nitrofurantoin, macrocrystal-monohydrate, (MACROBID) 100 MG capsule Take 1 capsule (100 mg total) by mouth 2 (two) times daily. 14 capsule 0 11/27/2014 at Unknown time  . nortriptyline (PAMELOR) 50 MG capsule Take 50 mg by mouth at bedtime.   08/18/2015  . Vitamin D,  Ergocalciferol, (DRISDOL) 50000 units CAPS capsule Take 50,000 Units by mouth 2 (two) times a week.  99     Musculoskeletal: Strength & Muscle Tone: within normal limits Gait & Station: normal Patient leans: N/A  Psychiatric Specialty Exam: Physical Exam  Review of Systems  Constitutional: Negative.   HENT: Negative.   Eyes: Negative.   Cardiovascular: Negative.   Gastrointestinal: Negative for abdominal pain, blood in stool, nausea and vomiting.  Genitourinary: Negative.   Musculoskeletal: Negative.   Skin: Negative.   Neurological: Positive for headaches. Negative for seizures.  Endo/Heme/Allergies: Negative.   Psychiatric/Behavioral: Positive for depression and suicidal ideas.    Blood pressure (!) 121/96, pulse 93, temperature 98.3 F (36.8 C), temperature source Oral, resp. rate 20, height '5\' 6"'$  (1.676 m), weight 106 kg, last menstrual period 03/14/2018, SpO2 100 %.Body mass index is 37.72 kg/m.  General Appearance: Fairly Groomed  Eye Contact:  Fair  Speech:  Normal Rate  Volume:  Decreased  Mood:  Depressed  Affect:  Constricted and Tearful  Thought Process:  Linear and Descriptions of Associations: Intact  Orientation:  Full (Time, Place, and Person)  Thought Content:  no hallucinations , no delusions expressed, not internally preoccupied   Suicidal Thoughts:  No denies current suicidal ideations, denies self injurious ideations, contracts for safety on unit   Homicidal Thoughts:  No  Memory:  recent and remote grossly intact  Judgement:  Fair  Insight:  Fair  Psychomotor Activity:  Decreased  Concentration:  Concentration: Good and Attention Span: Fair  Recall:  Good  Fund of Knowledge:  Good  Language:  Good  Akathisia:  No  Handed:  Right  AIMS (if indicated):     Assets:  Chief Executive Officer Physical Health Social Support Vocational/Educational  ADL's:  Intact  Cognition:  WNL  Sleep:  Number of Hours: 6.75    Treatment Plan  Summary: Daily contact with patient to assess and evaluate symptoms and progress in treatment and Medication management  Observation Level/Precautions:  15 minute checks  Laboratory:  TSH. Reports history of Vitamin D deficiency in the past . Will check Vitamin D, B12.  Psychotherapy:  Group therapy, milieu  Medications:  We reviewed medication options. Patient reports Anette Guarneri was helpful in the past and well tolerated  ( took it for 3 months, stopped due to lack of insurance/cost ) . Start Latuda 20 mgr QDAY  Restart Lamictal at 25 mgrs QDAY  Start Zoloft 25 mgrs QDAY     Consultations:  As needed  Discharge Concerns:  -  Estimated LOS: 5 days   Other:     Physician Treatment Plan for Primary Diagnosis:  Bipolar Disorder, Depressed  Long Term Goal(s): Improvement in symptoms so as ready for discharge  Short Term Goals: Ability to identify changes in lifestyle to reduce recurrence of condition will improve, Ability to verbalize feelings will improve, Ability to identify and develop effective coping behaviors will improve, Ability to maintain clinical measurements within normal limits will improve, Compliance with prescribed medications will improve and Ability to identify triggers associated with substance abuse/mental health issues will improve  Physician Treatment Plan for Secondary Diagnosis: Suicidal Ideations Long Term Goal(s): Improvement in symptoms so as ready for discharge  Short Term Goals: Ability to identify changes in lifestyle to reduce recurrence of condition will improve, Ability to verbalize feelings will improve, Ability to disclose and discuss suicidal ideas, Ability to identify and develop effective coping behaviors will improve and Ability to identify triggers associated with substance abuse/mental health issues will improve  I certify that inpatient services furnished can reasonably be expected to improve the patient's condition.    Ferrel Logan,  Student-PA 11/1/201910:43 AM

## 2018-03-17 NOTE — [facility] Suicide Risk Assessment (Signed)
[facility] Admission Suicide Risk Assessment   Nursing information obtained from:  Patient Demographic factors:  Living alone Current Mental Status:  Suicidal ideation indicated by patient, Self-harm thoughts Loss Factors:  Loss of significant relationship("my husband left and he's not coming back") Historical Factors:  NA Risk Reduction Factors:  Employed, Positive social support, Positive therapeutic relationship, Living with another person, especially a relative, Religious beliefs about death  Total Time spent with patient: 45 minutes Principal Problem: Bipolar Disorder, Depressed  Diagnosis:   Patient Active Problem List   Diagnosis Date Noted  . MDD (major depressive disorder), recurrent severe, without psychosis ([facility]) [F33.2] 03/16/2018  . Suicide attempt ([facility]) [T14.91XA]    Subjective Data:   Continued Clinical Symptoms:  Alcohol Use Disorder Identification Test Final Score (AUDIT): 6 The "Alcohol Use Disorders Identification Test", Guidelines for Use in Primary Care, Second Edition.  World Science writer [facility]). Score between 0-7:  no or low risk or alcohol related problems. Score between 8-15:  moderate risk of alcohol related problems. Score between 16-19:  high risk of alcohol related problems. Score 20 or above:  warrants further diagnostic evaluation for alcohol dependence and treatment.   CLINICAL FACTORS:  32 year old female.  Reports prior history of bipolar disorder diagnoses.  Describes prior episodes of depression and brief episodes of increased energy, decreased need for sleep.  Reports worsening depression, neurovegetative symptoms, recent suicide attempt by overdosing on alcohol and Xanax. Of note she has been off psychiatric medications for several months due to insurance/cost constraints    Psychiatric Specialty Exam: Physical Exam  ROS  Blood pressure (!) 121/96, pulse 93, temperature 98.3 F (36.8 C), temperature source Oral, resp. rate 20, height 5\' 6"   (1.676 m), weight 106 kg, last menstrual period 03/14/2018, SpO2 100 %.Body mass index is 37.72 kg/m.  See admit note MSE    COGNITIVE FEATURES THAT CONTRIBUTE TO RISK:  Closed-mindedness and Loss of executive function    SUICIDE RISK:   Moderate:  Frequent suicidal ideation with limited intensity, and duration, some specificity in terms of plans, no associated intent, good self-control, limited dysphoria/symptomatology, some risk factors present, and identifiable protective factors, including available and accessible social support.  PLAN OF CARE: Patient will be admitted to inpatient psychiatric unit for stabilization and safety. Will provide and encourage milieu participation. Provide medication management and maked adjustments as needed.  Will follow daily.    I certify that inpatient services furnished can reasonably be expected to improve the patient's condition.   Craige Cotta, MD 03/17/2018, 2:54 PM

## 2018-03-17 NOTE — Progress Notes (Signed)
D: Pt was at the nurse's station upon initial approach.  Pt presents with depressed affect and mood.  Her goal is to "not do as much crying."  She reports she had a good visit with her sister, brother, and niece tonight.  Pt denies SI/HI, denies hallucinations, denies pain.  Pt has been visible in milieu interacting with peers and staff cautiously.  Pt attended evening group with encouragement from staff.    A: Met with pt 1:1.  Actively listened to pt and offered support and encouragement.  PRN medication administered for sleep.  Q15 minute safety checks maintained.  R: Pt is safe on the unit.  Pt is compliant with medication.  Pt verbally contracts for safety.  Will continue to monitor and assess.

## 2018-03-17 NOTE — Progress Notes (Signed)
D Pt presents to the medication widow first thing this moring after night shift exiyrf the building and after she was allowed the change to eat ( she shose not to eat per staff). She endorses a flat and depressed affect.  She begins to sob when she tries to talk to this nurse about her issues. SHe avoids making eye contact and constantly looks down and away. She is unconsolable and she shakes her head and shrugs her shoulders and says " I don't know what went wrong".     A SHe completed her daily assessment and on this she wrote she denied SI today and she rated her depression, hopelessness and anxeity " 10/10/8", respectively. She is not able to commit to staying out of her room the entire shift  And /or attendign groups but she is willing to contract verbally with this Clinical research associate to not hurt herslef today.     R Safety is maintained., Therapeutic relationship fostered and pt offered encouragement by this Clinical research associate.              A

## 2018-03-18 DIAGNOSIS — F332 Major depressive disorder, recurrent severe without psychotic features: Secondary | ICD-10-CM

## 2018-03-18 LAB — HEMOGLOBIN A1C
HEMOGLOBIN A1C: 5.4 % (ref 4.8–5.6)
Mean Plasma Glucose: 108.28 mg/dL

## 2018-03-18 LAB — LIPID PANEL
CHOL/HDL RATIO: 5.5 ratio
Cholesterol: 202 mg/dL — ABNORMAL HIGH (ref 0–200)
HDL: 37 mg/dL — AB (ref >40)
LDL CALC: 146 mg/dL — AB (ref 0–99)
Triglycerides: 93 mg/dL (ref <150)
VLDL: 19 mg/dL (ref 0–40)

## 2018-03-18 LAB — VITAMIN B12: Vitamin B-12: 391 pg/mL (ref 180–914)

## 2018-03-18 LAB — TSH: TSH: 1.05 u[IU]/mL (ref 0.350–4.500)

## 2018-03-18 MED ORDER — MIRTAZAPINE 7.5 MG PO TABS
7.5000 mg | ORAL_TABLET | Freq: Every day | ORAL | Status: DC
  Administered 2018-03-18 – 2018-03-20 (×3): 7.5 mg via ORAL
  Filled 2018-03-18 (×4): qty 1

## 2018-03-18 NOTE — Progress Notes (Signed)
D.  Pt pleasant but cautious on approach, complaint of cramping from period.  Pt has been on phone most of evening after group, minimal interaction on unit.  Pt denies SI/HI/AVH at this time.  A.  Support and encouragement offered, medication given as ordered  R.  Pt remains safe on the unit, will continue to monitor.

## 2018-03-18 NOTE — Progress Notes (Addendum)
Pt came to writer and reported nausea and vomiting X1.  She reports she thinks it is "because the Trazodone."  Pt also reports "I haven't really ate since Tuesday."  Staff has provided pt with meals and snacks the past two nights.  MHT reports pt was eating during visitation earlier this shift.  PRN medication administered for nausea.  Ginger ale offered, pt declined.  Pt reports she will inform staff of further needs and concerns.  Will continue to monitor and assess.

## 2018-03-18 NOTE — Progress Notes (Signed)
D: Patient presents irritable, depressed. She complains of nausea and zofran given. She slept fair last night, and felt the trazodone made her nauseas. Her appetite is coincidentally poor, energy low, and concentration good. She rates her depression, hopelessness and anxiety 8/10. She also complains of a HA 2/10. Patient denies SI/HI/AVH.  A: Patient checked q15 min, and checks reviewed. Reviewed medication changes with patient and educated on side effects. Educated patient on importance of attending group therapy sessions and educated on several coping skills. Encouarged participation in milieu through recreation therapy and attending meals with peers. Support and encouragement provided. Fluids offered. R: Patient receptive to education on medications, and is medication compliant. Patient contracts for safety on the unit. She set no goals for today.

## 2018-03-18 NOTE — [facility] Group Notes (Signed)
LCSW Group Therapy Note  03/18/2018   10:30-11:30am   Type of Therapy and Topic:  Group Therapy: Anger, a Secondary Emotion  Participation Level:  Active  Description of Group:   In this group, patients learned how to recognize the underlying emotions when they become angry as well as the frequent behavioral responses they have to anger-provoking situations.  They identified a recent time they became angry and how they reacted.  They analyzed how their reaction was possibly beneficial and how it was possibly unhelpful.  The group discussed a variety of healthier coping skills that could help with such a situation in the future, including taking time to think about the underlying primary emotion and figuring out ways to deal with that before it escalates to anger.  Therapeutic Goals: 1. Patients will remember their last incident of anger and rate it on a scale of 1-10, identifying the severity for them personally. 2. Patients will identify how their behavior at that time worked for them, as well as how it worked against them. 3. Patients will explore possible new behaviors to use in future anger situations. 4. Patients will learn that anger itself is normal and cannot be eliminated, and that healthier reactions can assist with resolving conflict rather than worsening situations.  Summary of Patient Progress:  The patient stated that her most recent anger was this morning and she rated it a 6 out of 10.  She spoke frequently about keeping her anger inside, because if she lets it out she may explode.  She stated that she does eventually end up exploding over something unrelated, and realizes this is displacement.  She stated that sometimes she will just laugh when she is very angry, and asked why this is.  The other group members related well to her and said it is a way of releasing pressure.  She was very involved and interested throughout group.  Therapeutic Modalities:   Cognitive Behavioral  Therapy Discussion  Lynnell Chad

## 2018-03-18 NOTE — Progress Notes (Addendum)
[facility] MD Progress Note  03/18/2018 10:20 AM Diana Roman  MRN:  [id number]   Subjective: Patient reports this morning that she is not feeling good.  She reports having some nausea and vomited once.  She stated that she had to go get some Zofran from the nurse.  She contributed to the trazodone as she has not had any other medications since she took the trazodone last night at bedtime.  She reports that she still feels that she needs something to help her with sleep and is interested in another medication.  She denies any other medication side effects at this time.  She denies any suicidal or homicidal ideations and denies any hallucinations today.  She reports that she did sleep well with the trazodone though.  She reports having a fair appetite.  Objective: Patient's chart and findings reviewed and discussed with treatment team.  Patient presents with a flat affect and appears depressed.  She is cooperative and pleasant though.  She has been seen in the day room and has been interacting minimally with other peers and staff.  There have been no complaints about her on the unit thus far.  Principal Problem: MDD (major depressive disorder), recurrent severe, without psychosis (HCC) Diagnosis:   Patient Active Problem List   Diagnosis Date Noted  . MDD (major depressive disorder), recurrent severe, without psychosis (HCC) [F33.2] 03/16/2018  . Suicide attempt (HCC) [T14.91XA]    Total Time spent with patient: 30 minutes  Past Psychiatric History: See H&P  Past Medical History:  Past Medical History:  Diagnosis Date  . Abnormal Pap smear 2011  . Anxiety   . Bipolar 1 disorder (HCC)   . Depression    on meds, doing well  . H/O bacterial infection   . H/O varicella   . Headache(784.0)    Frequent  . Hypertension   . Infection    UTI  . Migraine   . No pertinent past medical history   . Sickle cell trait (HCC)   . Vaginal Pap smear, abnormal    repeat was normal  . Yeast infection      Past Surgical History:  Procedure Laterality Date  . DENTAL SURGERY    . NO PAST SURGERIES     Family History:  Family History  Problem Relation Age of Onset  . Hypertension Mother   . Diabetes Maternal Grandmother   . Glaucoma Maternal Grandmother   . Hypertension Sister    Family Psychiatric  History: See H&P Social History:  Social History   Substance and Sexual Activity  Alcohol Use Yes   Comment: 2/wk     Social History   Substance and Sexual Activity  Drug Use No    Social History   Socioeconomic History  . Marital status: Married    Spouse name: Not on file  . Number of children: Not on file  . Years of education: Not on file  . Highest education level: Not on file  Occupational History  . Not on file  Social Needs  . Financial resource strain: Not on file  . Food insecurity:    Worry: Not on file    Inability: Not on file  . Transportation needs:    Medical: Not on file    Non-medical: Not on file  Tobacco Use  . Smoking status: Never Smoker  . Smokeless tobacco: Never Used  Substance and Sexual Activity  . Alcohol use: Yes    Comment: 2/wk  . Drug use: No  .  Sexual activity: Yes    Birth control/protection: None  Lifestyle  . Physical activity:    Days per week: Not on file    Minutes per session: Not on file  . Stress: Not on file  Relationships  . Social connections:    Talks on phone: Not on file    Gets together: Not on file    Attends religious service: Not on file    Active member of club or organization: Not on file    Attends meetings of clubs or organizations: Not on file    Relationship status: Not on file  Other Topics Concern  . Not on file  Social History Narrative  . Not on file   Additional Social History:                         Sleep: Good  Appetite:  Fair  Current Medications: Current Facility-Administered Medications  Medication Dose Route Frequency Provider Last Rate Last Dose  . acetaminophen  (TYLENOL) tablet 650 mg  650 mg Oral Q6H PRN Laveda Abbe, NP   650 mg at 03/17/18 1148  . alum & mag hydroxide-simeth (MAALOX/MYLANTA) 200-200-20 MG/5ML suspension 30 mL  30 mL Oral Q4H PRN Laveda Abbe, NP      . hydrOXYzine (ATARAX/VISTARIL) tablet 25 mg  25 mg Oral Q6H PRN Alyanah Elliott, Rockey Situ, MD      . ibuprofen (ADVIL,MOTRIN) tablet 800 mg  800 mg Oral TID Laveda Abbe, NP   800 mg at 03/18/18 5366  . lamoTRIgine (LAMICTAL) tablet 25 mg  25 mg Oral Daily Lancer Thurner, Rockey Situ, MD   25 mg at 03/18/18 0821  . loperamide (IMODIUM) capsule 2-4 mg  2-4 mg Oral PRN Venesa Semidey, Rockey Situ, MD      . LORazepam (ATIVAN) tablet 1 mg  1 mg Oral Q6H PRN Shermika Balthaser A, MD      . lurasidone (LATUDA) tablet 20 mg  20 mg Oral Q breakfast Kallie Depolo, Rockey Situ, MD   20 mg at 03/18/18 0821  . magnesium hydroxide (MILK OF MAGNESIA) suspension 30 mL  30 mL Oral Daily PRN Laveda Abbe, NP      . mirtazapine (REMERON) tablet 7.5 mg  7.5 mg Oral QHS Money, Gerlene Burdock, FNP      . multivitamin with minerals tablet 1 tablet  1 tablet Oral Daily Averey Koning, Rockey Situ, MD   1 tablet at 03/18/18 0821  . ondansetron (ZOFRAN-ODT) disintegrating tablet 4 mg  4 mg Oral Q6H PRN Toriann Spadoni, Rockey Situ, MD   4 mg at 03/18/18 0440  . sertraline (ZOLOFT) tablet 25 mg  25 mg Oral Daily Antario Yasuda, Rockey Situ, MD   25 mg at 03/18/18 0821  . thiamine (VITAMIN B-1) tablet 100 mg  100 mg Oral Daily Kayode Petion, Rockey Situ, MD   100 mg at 03/18/18 4403    Lab Results:  Results for orders placed or performed during the hospital encounter of 03/16/18 (from the past 48 hour(s))  TSH     Status: None   Collection Time: 03/18/18  6:12 AM  Result Value Ref Range   TSH 1.050 0.350 - 4.500 uIU/mL    Comment: Performed by a 3rd Generation assay with a functional sensitivity of <=0.01 uIU/mL. Performed at [facility], [address] [place], [place] [address]   Vitamin B12     Status: None   Collection Time:  03/18/18  6:12 AM  Result Value Ref  Range   Vitamin B-12 391 180 - 914 pg/mL    Comment: (NOTE) This assay is not validated for testing neonatal or myeloproliferative syndrome specimens for Vitamin B12 levels. Performed at [facility], [address], [place], [place] [address]   Hemoglobin A1c     Status: None   Collection Time: 03/18/18  6:12 AM  Result Value Ref Range   Hgb A1c MFr Bld 5.4 4.8 - 5.6 %    Comment: (NOTE) Pre diabetes:          5.7%-6.4% Diabetes:              >6.4% Glycemic control for   <7.0% adults with diabetes    Mean Plasma Glucose 108.28 mg/dL    Comment: Performed at [facility], [address], [place], [place] [address]    Blood Alcohol level:  Lab Results  Component Value Date   ETH 65 (H) 03/15/2018   ETH  04/18/2010    5        LOWEST DETECTABLE LIMIT FOR SERUM ALCOHOL IS 5 mg/dL FOR MEDICAL PURPOSES ONLY    Metabolic Disorder Labs: Lab Results  Component Value Date   HGBA1C 5.4 03/18/2018   MPG 108.28 03/18/2018   No results found for: PROLACTIN No results found for: CHOL, TRIG, HDL, CHOLHDL, VLDL, LDLCALC  Physical Findings: AIMS: Facial and Oral Movements Muscles of Facial Expression: None, normal Lips and Perioral Area: None, normal Jaw: None, normal Tongue: None, normal,Extremity Movements Upper (arms, wrists, hands, fingers): None, normal Lower (legs, knees, ankles, toes): None, normal, Trunk Movements Neck, shoulders, hips: None, normal, Overall Severity Severity of abnormal movements (highest score from questions above): None, normal Incapacitation due to abnormal movements: None, normal Patient's awareness of abnormal movements (rate only patient's report): No Awareness, Dental Status Current problems with teeth and/or dentures?: No Does patient usually wear dentures?: No  CIWA:  CIWA-Ar Total: 4 COWS:     Musculoskeletal: Strength & Muscle Tone: within normal limits Gait & Station:  normal Patient leans: N/A  Psychiatric Specialty Exam: Physical Exam  Nursing note and vitals reviewed. Constitutional: She is oriented to person, place, and time. She appears well-developed and well-nourished.  Cardiovascular: Normal rate.  Respiratory: Effort normal.  Musculoskeletal: Normal range of motion.  Neurological: She is alert and oriented to person, place, and time.  Skin: Skin is warm.    Review of Systems  Constitutional: Negative.   HENT: Negative.   Eyes: Negative.   Respiratory: Negative.   Cardiovascular: Negative.   Gastrointestinal: Positive for nausea and vomiting.  Genitourinary: Negative.   Musculoskeletal: Negative.   Skin: Negative.   Neurological: Negative.   Endo/Heme/Allergies: Negative.   Psychiatric/Behavioral: Positive for depression and substance abuse. The patient is nervous/anxious.     Blood pressure 120/85, pulse 94, temperature 98.4 F (36.9 C), temperature source Oral, resp. rate 20, height 5\' 6"  (1.676 m), weight 106 kg, last menstrual period 03/14/2018, SpO2 100 %.Body mass index is 37.72 kg/m.  General Appearance: Casual  Eye Contact:  Fair  Speech:  Clear and Coherent and Normal Rate  Volume:  Decreased  Mood:  Depressed  Affect:  Flat  Thought Process:  Linear and Descriptions of Associations: Intact  Orientation:  Full (Time, Place, and Person)  Thought Content:  WDL  Suicidal Thoughts:  No  Homicidal Thoughts:  No  Memory:  Immediate;   Good Recent;   Good Remote;   Good  Judgement:  Fair  Insight:  Fair  Psychomotor Activity:  Normal  Concentration:  Concentration: Good and Attention Span: Good  Recall:  Good  Fund of Knowledge:  Good  Language:  Good  Akathisia:  No  Handed:  Right  AIMS (if indicated):     Assets:  Communication Skills Desire for Improvement Financial Resources/Insurance Housing Physical Health Social Support Transportation  ADL's:  Intact  Cognition:  WNL  Sleep:  Number of Hours: 5.5    Problems addressed MDD severe recurrent without psychosis   Treatment Plan Summary: Daily contact with patient to assess and evaluate symptoms and progress in treatment, Medication management and Plan is to: Discontinue trazodone due to nausea and vomiting Start Remeron 7.5 mg p.o. nightly for insomnia and mood stability Continue Lamictal 25 mg p.o. daily for mood stability Continue Latuda 20 mg p.o. daily for mood stability Continue Zoloft 25 mg p.o. daily for mood stability Encourage group therapy participation  Maryfrances Bunnell, FNP 03/18/2018, 10:20 AM    ..Agree with NP Progress Note

## 2018-03-19 MED ORDER — IBUPROFEN 800 MG PO TABS
800.0000 mg | ORAL_TABLET | Freq: Three times a day (TID) | ORAL | Status: DC | PRN
  Administered 2018-03-20: 800 mg via ORAL
  Filled 2018-03-19: qty 1

## 2018-03-19 NOTE — [facility] Group Notes (Signed)
LCSW Group Therapy Note  03/19/2018    10:00 - 11:00 AM               Type of Therapy and Topic:  Group Therapy: Well-being  Participation Level:  Active   Description of Group:   In this group, patients started with an ice breaker talking about their favorite type of pie and the ingredients needed to make it. CSW asked patients what happens if we are missing one thing we need like the pan or the flour. CSW related this to our well-being and created a well-being pie. Patients learned how to define well-being as well as the parts that include: physical, mental, emotional and spiritual. They were asked to identify why each part is important and provide examples for each section. Patients engaged in group discussion about these concepts. Patients were provided a self-assessment to explore their own well-being and areas that they may need to make a change in. Patients shared results as comfortable doing so. Patients were provided a handout with a list of ways to improve our well-being and to cope better with life. Patients were asked to read these out loud and share thoughts. CSW explained our circle could also be a flat tire because like missing an ingredient the flat tire makes it difficult to steer, the ride bumpy, could cause a wreck or keep [place] from reaching our destination. CSW placed an emphasis on therapy, doctor visits and taking medications.   Therapeutic Goals: 1. Patients will learn the four main areas of our well-being and examples of each. 2. Patients will complete an assessment to reflect on their own needs. 3. Patients will discuss how they can meet the needs. 4. Patients will be asked to read out loud some positive ways to improve our overall well-being.  5. Patients will be encouraged to identify one area that needs improvement and create a plan on how to achieve this.    Summary of Patient Progress:  Patient was late to group and appeared annoyed initially but participation in group  grew over time and was insightful in responses. Patient reports she wants to be able to accept things - it is what it is to improve her overall wellbeing.    Therapeutic Modalities:   Cognitive Behavioral Therapy Motivational Interviewing  Brief Therapy  Shellia Cleverly, LCSW  03/19/2018 3:05 PM

## 2018-03-19 NOTE — Plan of Care (Signed)
D: Patient presents irritable, depressed, guarded. She denies feeling nauseas today. She reports her mood is good, but rates her depression and sense of hopelessness 5/10. Her anxiety is 6/10. She slept fair last night, and received medication that was helpful last night. Her appetite is fair, energy normal and concentration good. She denies withdrawal symptoms. She complains of lightheadedness and headaches. She appears apathetic to treatment here. Patient denies SI/HI/AVH.  A: Patient checked q15 min, and checks reviewed. Reviewed medication changes with patient and educated on side effects. Educated patient on importance of attending group therapy sessions and educated on several coping skills. Encouarged participation in milieu through recreation therapy and attending meals with peers. Support and encouragement provided. Fluids offered. R: Patient receptive to education on medications, and is medication compliant. Patient contracts for safety on the unit. She did not set any goals for today.

## 2018-03-19 NOTE — Progress Notes (Addendum)
[facility] MD Progress Note  03/19/2018 10:16 AM Diana Roman  MRN:  [id number]   Subjective: Patient states that she is feeling better today.  She denies having any nausea or vomiting after starting the Remeron last night.  She wants to continue it and feels that it did help her with some sleep last night.  She denies any other medication side effects today.  She denies any suicidal or homicidal ideations and denies any hallucinations.  Patient does admit to feeling depressed and reports her depression at a 5 out of 10 with 10 being the worst.  She denies having any anxiety today.  She reports for her depression is due to to the fact that she cannot get in touch with her husband this morning.  She stated that he is supportive and she feels sad for not been able to talk to him today.  Objective: Patient's chart and findings reviewed and discussed with treatment team.  Patient is found in her room lying in her bed, but she is awake.  Patient does appear depressed and talks with a low voice and has a flat affect.  She denies any other complications or problems today.  She has not been seen interacting with anyone today and has not been going to the groups.  Hopefully patient will become more active after she speaks to her husband today.  Principal Problem: MDD (major depressive disorder), recurrent severe, without psychosis (HCC) Diagnosis:   Patient Active Problem List   Diagnosis Date Noted  . MDD (major depressive disorder), recurrent severe, without psychosis (HCC) [F33.2] 03/16/2018  . Suicide attempt (HCC) [T14.91XA]    Total Time spent with patient: 20 minutes  Past Psychiatric History: See H&P  Past Medical History:  Past Medical History:  Diagnosis Date  . Abnormal Pap smear 2011  . Anxiety   . Bipolar 1 disorder (HCC)   . Depression    on meds, doing well  . H/O bacterial infection   . H/O varicella   . Headache(784.0)    Frequent  . Hypertension   . Infection    UTI  . Migraine    . No pertinent past medical history   . Sickle cell trait (HCC)   . Vaginal Pap smear, abnormal    repeat was normal  . Yeast infection     Past Surgical History:  Procedure Laterality Date  . DENTAL SURGERY    . NO PAST SURGERIES     Family History:  Family History  Problem Relation Age of Onset  . Hypertension Mother   . Diabetes Maternal Grandmother   . Glaucoma Maternal Grandmother   . Hypertension Sister    Family Psychiatric  History: See H&P Social History:  Social History   Substance and Sexual Activity  Alcohol Use Yes   Comment: 2/wk     Social History   Substance and Sexual Activity  Drug Use No    Social History   Socioeconomic History  . Marital status: Married    Spouse name: Not on file  . Number of children: Not on file  . Years of education: Not on file  . Highest education level: Not on file  Occupational History  . Not on file  Social Needs  . Financial resource strain: Not on file  . Food insecurity:    Worry: Not on file    Inability: Not on file  . Transportation needs:    Medical: Not on file    Non-medical: Not on file  Tobacco Use  . Smoking status: Never Smoker  . Smokeless tobacco: Never Used  Substance and Sexual Activity  . Alcohol use: Yes    Comment: 2/wk  . Drug use: No  . Sexual activity: Yes    Birth control/protection: None  Lifestyle  . Physical activity:    Days per week: Not on file    Minutes per session: Not on file  . Stress: Not on file  Relationships  . Social connections:    Talks on phone: Not on file    Gets together: Not on file    Attends religious service: Not on file    Active member of club or organization: Not on file    Attends meetings of clubs or organizations: Not on file    Relationship status: Not on file  Other Topics Concern  . Not on file  Social History Narrative  . Not on file   Additional Social History:                         Sleep: Good  Appetite:   Fair  Current Medications: Current Facility-Administered Medications  Medication Dose Route Frequency Provider Last Rate Last Dose  . acetaminophen (TYLENOL) tablet 650 mg  650 mg Oral Q6H PRN Laveda Abbe, NP   650 mg at 03/17/18 1148  . alum & mag hydroxide-simeth (MAALOX/MYLANTA) 200-200-20 MG/5ML suspension 30 mL  30 mL Oral Q4H PRN Laveda Abbe, NP      . hydrOXYzine (ATARAX/VISTARIL) tablet 25 mg  25 mg Oral Q6H PRN Caresse Sedivy, Rockey Situ, MD   25 mg at 03/18/18 1654  . ibuprofen (ADVIL,MOTRIN) tablet 800 mg  800 mg Oral TID Laveda Abbe, NP   800 mg at 03/19/18 0835  . lamoTRIgine (LAMICTAL) tablet 25 mg  25 mg Oral Daily Zoran Yankee, Rockey Situ, MD   25 mg at 03/19/18 0836  . loperamide (IMODIUM) capsule 2-4 mg  2-4 mg Oral PRN Daryel Kenneth, Rockey Situ, MD      . LORazepam (ATIVAN) tablet 1 mg  1 mg Oral Q6H PRN Savien Mamula A, MD      . lurasidone (LATUDA) tablet 20 mg  20 mg Oral Q breakfast Venicia Vandall, Rockey Situ, MD   20 mg at 03/19/18 0836  . magnesium hydroxide (MILK OF MAGNESIA) suspension 30 mL  30 mL Oral Daily PRN Laveda Abbe, NP      . mirtazapine (REMERON) tablet 7.5 mg  7.5 mg Oral QHS Money, Feliz Beam B, FNP   7.5 mg at 03/18/18 2147  . multivitamin with minerals tablet 1 tablet  1 tablet Oral Daily Princes Finger, Rockey Situ, MD   1 tablet at 03/19/18 0836  . ondansetron (ZOFRAN-ODT) disintegrating tablet 4 mg  4 mg Oral Q6H PRN Macklyn Glandon, Rockey Situ, MD   4 mg at 03/18/18 1216  . sertraline (ZOLOFT) tablet 25 mg  25 mg Oral Daily Linwood Gullikson, Rockey Situ, MD   25 mg at 03/19/18 0836  . thiamine (VITAMIN B-1) tablet 100 mg  100 mg Oral Daily Torina Ey, Rockey Situ, MD   100 mg at 03/18/18 1610    Lab Results:  Results for orders placed or performed during the hospital encounter of 03/16/18 (from the past 48 hour(s))  TSH     Status: None   Collection Time: 03/18/18  6:12 AM  Result Value Ref Range   TSH 1.050 0.350 - 4.500 uIU/mL    Comment: Performed by a 3rd Generation assay  with a functional sensitivity of <=0.01 uIU/mL. Performed at [facility], [address] [place], [place] [address]   Vitamin B12     Status: None   Collection Time: 03/18/18  6:12 AM  Result Value Ref Range   Vitamin B-12 391 180 - 914 pg/mL    Comment: (NOTE) This assay is not validated for testing neonatal or myeloproliferative syndrome specimens for Vitamin B12 levels. Performed at [facility], [address] [place], [place] [address]   Lipid panel     Status: Abnormal   Collection Time: 03/18/18  6:12 AM  Result Value Ref Range   Cholesterol 202 (H) 0 - 200 mg/dL   Triglycerides 93 <098 mg/dL   HDL 37 (L) >11 mg/dL   Total CHOL/HDL Ratio 5.5 RATIO   VLDL 19 0 - 40 mg/dL   LDL Cholesterol 914 (H) 0 - 99 mg/dL    Comment:        Total Cholesterol/HDL:CHD Risk Coronary Heart Disease Risk Table                     Men   Women  1/2 Average Risk   3.4   3.3  Average Risk       5.0   4.4  2 X Average Risk   9.6   7.1  3 X Average Risk  23.4   11.0        Use the calculated Patient Ratio above and the CHD Risk Table to determine the patient's CHD Risk.        ATP III CLASSIFICATION (LDL):  <100     mg/dL   Optimal  782-956  mg/dL   Near or Above                    Optimal  130-159  mg/dL   Borderline  213-086  mg/dL   High  >578     mg/dL   Very High Performed at [facility], [address] [place], [place] [address]   Hemoglobin A1c     Status: None   Collection Time: 03/18/18  6:12 AM  Result Value Ref Range   Hgb A1c MFr Bld 5.4 4.8 - 5.6 %    Comment: (NOTE) Pre diabetes:          5.7%-6.4% Diabetes:              >6.4% Glycemic control for   <7.0% adults with diabetes    Mean Plasma Glucose 108.28 mg/dL    Comment: Performed at [facility], [address], [place], [place] [address]    Blood Alcohol level:  Lab Results  Component Value Date   ETH 65 (H) 03/15/2018   ETH  04/18/2010     5        LOWEST DETECTABLE LIMIT FOR SERUM ALCOHOL IS 5 mg/dL FOR MEDICAL PURPOSES ONLY    Metabolic Disorder Labs: Lab Results  Component Value Date   HGBA1C 5.4 03/18/2018   MPG 108.28 03/18/2018   No results found for: PROLACTIN Lab Results  Component Value Date   CHOL 202 (H) 03/18/2018   TRIG 93 03/18/2018   HDL 37 (L) 03/18/2018   CHOLHDL 5.5 03/18/2018   VLDL 19 03/18/2018   LDLCALC 146 (H) 03/18/2018    Physical Findings: AIMS: Facial and Oral Movements Muscles of Facial Expression: None, normal Lips and Perioral Area: None, normal Jaw: None, normal Tongue: None, normal,Extremity  Movements Upper (arms, wrists, hands, fingers): None, normal Lower (legs, knees, ankles, toes): None, normal, Trunk Movements Neck, shoulders, hips: None, normal, Overall Severity Severity of abnormal movements (highest score from questions above): None, normal Incapacitation due to abnormal movements: None, normal Patient's awareness of abnormal movements (rate only patient's report): No Awareness, Dental Status Current problems with teeth and/or dentures?: No Does patient usually wear dentures?: No  CIWA:  CIWA-Ar Total: 0 COWS:  COWS Total Score: 0  Musculoskeletal: Strength & Muscle Tone: within normal limits Gait & Station: normal Patient leans: N/A  Psychiatric Specialty Exam: Physical Exam  Nursing note and vitals reviewed. Constitutional: She is oriented to person, place, and time. She appears well-developed and well-nourished.  Cardiovascular: Normal rate.  Respiratory: Effort normal.  Musculoskeletal: Normal range of motion.  Neurological: She is alert and oriented to person, place, and time.  Skin: Skin is warm.    Review of Systems  Constitutional: Negative.   HENT: Negative.   Eyes: Negative.   Respiratory: Negative.   Cardiovascular: Negative.   Gastrointestinal: Negative.   Genitourinary: Negative.   Musculoskeletal: Negative.   Skin: Negative.    Neurological: Negative.   Endo/Heme/Allergies: Negative.   Psychiatric/Behavioral: Positive for depression.    Blood pressure (!) 136/97, pulse 75, temperature 98.4 F (36.9 C), temperature source Oral, resp. rate 20, height 5\' 6"  (1.676 m), weight 106 kg, last menstrual period 03/14/2018, SpO2 100 %.Body mass index is 37.72 kg/m.  General Appearance: Casual  Eye Contact:  Fair  Speech:  Clear and Coherent and Normal Rate  Volume:  Decreased  Mood:  Depressed  Affect:  Flat  Thought Process:  Linear and Descriptions of Associations: Intact  Orientation:  Full (Time, Place, and Person)  Thought Content:  WDL  Suicidal Thoughts:  No  Homicidal Thoughts:  No  Memory:  Immediate;   Good Recent;   Good Remote;   Good  Judgement:  Fair  Insight:  Fair  Psychomotor Activity:  Normal  Concentration:  Concentration: Good and Attention Span: Good  Recall:  Good  Fund of Knowledge:  Good  Language:  Good  Akathisia:  No  Handed:  Right  AIMS (if indicated):     Assets:  Communication Skills Desire for Improvement Financial Resources/Insurance Housing Physical Health Social Support Transportation  ADL's:  Intact  Cognition:  WNL  Sleep:  Number of Hours: 5.5   Problems addressed MDD severe recurrent without psychosis   Treatment Plan Summary: Daily contact with patient to assess and evaluate symptoms and progress in treatment, Medication management and Plan is to: Continue Remeron 7.5 mg p.o. nightly for insomnia and mood stability Continue Lamictal 25 mg p.o. daily for mood stability Continue Latuda 20 mg p.o. daily for mood stability Continue Zoloft 25 mg p.o. daily for mood stability Encourage group therapy participation  Maryfrances Bunnell, FNP 03/19/2018, 10:16 AM   ..Agree with NP Progress Note

## 2018-03-19 NOTE — [facility] Group Notes (Signed)
[facility] Group Notes:  (Nursing/MHT/Case Management/Adjunct)  Date:  03/19/2018  Time:  3:39 PM  Type of Therapy:  Nurse Education  Participation Level:  Did Not Attend  Summary of Progress/Problems: In this group, the nurse discussed positive self-image, positive self-talk. The nurse also covered how to change automatic negative thoughts and improving self-narrative.  Diana Roman 03/19/2018, 3:39 PM

## 2018-03-19 NOTE — Progress Notes (Signed)
D.  Pt pleasant on approach, denies complaints at this time.  Pt's husband had been to visit today and apparently it did not go well, Pt states she might have to take off her ring.  Pt did not appear agitated when discussing this.  Pt ddi not go to evening AA group, stated she didn't need to..  Pt denies SI/HI/AVH at this time.  A.  Support and encouragement offered, medication given as ordered  R.   Pt remain safe on the unit, will continue to monitor.

## 2018-03-19 NOTE — Progress Notes (Signed)
D: Patient observed throughout the day speaking with spouse on the phone. Her conversation sounds argumentative and elevated in nature. When her spouse came to visit, they spent a good deal of time arguing in the patient's room. He had approved time to visit outside of regular visitation d/t work schedule. A: Verified ROI and spoke with spouse and patient about patient's care and treatment plan. He wanted to know an update regarding her discharge, but we do not have a set discharge for her at this time. R: Spouse was reluctant to leave. Patient speaking on phone after visiting with spouse and appears to be arguing.

## 2018-03-19 NOTE — Progress Notes (Signed)
Patient did not attend the evening speaker AA meeting. Pt was notified that group was beginning but reported not needing to go to AA meetings and remained in her room.

## 2018-03-20 DIAGNOSIS — F314 Bipolar disorder, current episode depressed, severe, without psychotic features: Principal | ICD-10-CM

## 2018-03-20 LAB — VITAMIN D 25 HYDROXY (VIT D DEFICIENCY, FRACTURES): Vit D, 25-Hydroxy: 17 ng/mL — ABNORMAL LOW (ref 30.0–100.0)

## 2018-03-20 MED ORDER — SERTRALINE HCL 50 MG PO TABS
50.0000 mg | ORAL_TABLET | Freq: Every day | ORAL | Status: DC
  Administered 2018-03-21: 50 mg via ORAL
  Filled 2018-03-20 (×2): qty 1

## 2018-03-20 MED ORDER — HYDROXYZINE HCL 50 MG PO TABS: 50.0000 mg | ORAL_TABLET | Freq: Four times a day (QID) | ORAL | Status: DC | PRN

## 2018-03-20 NOTE — [facility] Suicide Risk Assessment (Signed)
[facility] INPATIENT:  Family/Significant Other Suicide Prevention Education  Suicide Prevention Education:  Contact Attempts: Nicoholas Little (pt's husband) [phone] has been identified by the patient as the family member/significant other with whom the patient will be residing, and identified as the person(s) who will aid the patient in the event of a mental health crisis.  With written consent from the patient, two attempts were made to provide suicide prevention education, prior to and/or following the patient's discharge.  We were unsuccessful in providing suicide prevention education.  A suicide education pamphlet was given to the patient to share with family/significant other.  Date and time of first attempt: 10:04AM on 03/20/18 (unable to leave voicemail) Date and time of second attempt: 1:40PM on 03/20/18  Rona Ravens LCSW 03/20/2018, 1:41 PM    SPI pamphlet provided to pt and pt was encouraged to share information with support network, ask questions, and talk about any concerns relating to SPE. Pt denies access to guns/firearms and verbalized understanding of information provided. Mobile Crisis information also provided to pt.

## 2018-03-20 NOTE — Progress Notes (Signed)
D: Pt was in her room with visitors upon initial approach.  Pt presents with appropriate affect and mood.  She describes her day as "good" and reports she had a good visit with her husband and daughter tonight.  Her goal today was "to go home" and pt reports she is discharging tomorrow.  Pt reports she feels safe to discharge tomorrow.  Pt denies SI/HI, denies hallucinations, reports pain from headache of 6/10.  Pt has been visible in milieu interacting with peers and staff appropriately.  Pt attended evening group.    A: Introduced self to pt.  Met with pt 1:1.  Actively listened to pt and offered support and encouragement. Medication administered per order.  PRN medication administered for pain.  Q15 minute safety checks maintained.  R: Pt is safe on the unit.  Pt is compliant with medications.  Pt verbally contracts for safety.  Will continue to monitor and assess.

## 2018-03-20 NOTE — Progress Notes (Signed)
Adult Psychoeducational Group Note  Date:  03/20/2018 Time:  9:46 PM  Group Topic/Focus:  Wrap-Up Group:   The focus of this group is to help patients review their daily goal of treatment and discuss progress on daily workbooks.  Participation Level:  Active  Participation Quality:  Appropriate  Affect:  Appropriate  Cognitive:  Appropriate  Insight: Appropriate  Engagement in Group:  Engaged  Modes of Intervention:  Discussion  Additional Comments:  Pt's goal was not to cry today.  Pt did meet her goal.  Pt stated she had a great family visit today.  Pt rated the day at a 7/10.  Emanuele Mcwhirter 03/20/2018, 9:46 PM

## 2018-03-20 NOTE — Progress Notes (Signed)
Recreation Therapy Notes  Date: 11.4.19 Time: 0930 Location: [address] Dayroom  Group Topic: Stress Management  Goal Area(s) Addresses:  Patient will verbalize importance of using healthy stress management.  Patient will identify positive emotions associated with healthy stress management.   Intervention: Stress Management  Activity :  Meditation.  LRT introduced the stress management technique of meditation.  LRT played a meditation dealing with resilience.  Patients were to listen and follow along with the meditation.  Education:  Stress Management, Discharge Planning.   Education Outcome: Acknowledges edcuation/In group clarification offered/Needs additional education  Clinical Observations/Feedback: Pt did not attend group.     Jenesys Casseus, LRT/CTRS         Brayli Klingbeil A 03/20/2018 11:01 AM 

## 2018-03-20 NOTE — Progress Notes (Signed)
[facility] MD Progress Note  03/20/2018 11:54 AM Diana Roman  MRN:  [id number] Subjective:    History as per psychiatric intake: 32 year old female who presented to ED via EMS after a passerby found her slumped over on a park bench after suicide attempt by overdosing on Xanax and alcohol . Reports she took about 14 tablets, which was " what was left in the bottle". Of note, is prescribed Xanax for anxiety, but states she takes only occasionally and denies any misuse or abuse. Also, she denies alcohol abuse, drinks only occasionally.  She reports she has been depressed x 2 months, and feels her mood has been worsening. Reports she had been experiencing passive SI recently but did not have plan or intention until that day. States " I was just tired of life".  Patient does not identify any specific triggers or stressors for worsening depression, States she has been off of her prescribed psychiatric medications since March 2019 due to financial /insurance constraints .  As per evaluation today: Pt shares, "On Wednesday I felt overwhelmed. My end game was to kill myself." Pt describes feeling overwhelmed with multiple stressors including separation from her husband, financial stressors, and having multiple responsibilities needing attention all at one time. She now describes feeling remorseful and guilty about her suicide attempt, and she has improved insight about addressing her stress before it gets out of control. She reports her mood has significantly improved. She is sleeping well. Her appetite is good. She denies other physical complaints. She denies SI/HI/AH/VH. She is tolerating her medications well, and she is is in agreement to continue her current regimen without changes. We discussed about increasing dose of zoloft, and pt was in agreement with that plan. Pt would like to discharge as soon as possible, and we discussed about potential discharge as soon as tomorrow if she has ongoing  improvement/stability of her symptoms. Pt was in agreement with the above plan, and she had no further questions, comments, or concerns.  Principal Problem: Bipolar 1 disorder, depressed, severe (HCC) Diagnosis:   Patient Active Problem List   Diagnosis Date Noted  . Bipolar 1 disorder, depressed, severe (HCC) [F31.4] 03/16/2018  . Suicide attempt (HCC) [T14.91XA]    Total Time spent with patient: 30 minutes  Past Psychiatric History: see H&P  Past Medical History:  Past Medical History:  Diagnosis Date  . Abnormal Pap smear 2011  . Anxiety   . Bipolar 1 disorder (HCC)   . Depression    on meds, doing well  . H/O bacterial infection   . H/O varicella   . Headache(784.0)    Frequent  . Hypertension   . Infection    UTI  . Migraine   . No pertinent past medical history   . Sickle cell trait (HCC)   . Vaginal Pap smear, abnormal    repeat was normal  . Yeast infection     Past Surgical History:  Procedure Laterality Date  . DENTAL SURGERY    . NO PAST SURGERIES     Family History:  Family History  Problem Relation Age of Onset  . Hypertension Mother   . Diabetes Maternal Grandmother   . Glaucoma Maternal Grandmother   . Hypertension Sister    Family Psychiatric  History: see H&P Social History:  Social History   Substance and Sexual Activity  Alcohol Use Yes   Comment: 2/wk     Social History   Substance and Sexual Activity  Drug Use No  Social History   Socioeconomic History  . Marital status: Married    Spouse name: Not on file  . Number of children: Not on file  . Years of education: Not on file  . Highest education level: Not on file  Occupational History  . Not on file  Social Needs  . Financial resource strain: Not on file  . Food insecurity:    Worry: Not on file    Inability: Not on file  . Transportation needs:    Medical: Not on file    Non-medical: Not on file  Tobacco Use  . Smoking status: Never Smoker  . Smokeless tobacco:  Never Used  Substance and Sexual Activity  . Alcohol use: Yes    Comment: 2/wk  . Drug use: No  . Sexual activity: Yes    Birth control/protection: None  Lifestyle  . Physical activity:    Days per week: Not on file    Minutes per session: Not on file  . Stress: Not on file  Relationships  . Social connections:    Talks on phone: Not on file    Gets together: Not on file    Attends religious service: Not on file    Active member of club or organization: Not on file    Attends meetings of clubs or organizations: Not on file    Relationship status: Not on file  Other Topics Concern  . Not on file  Social History Narrative  . Not on file   Additional Social History:                         Sleep: Good  Appetite:  Good  Current Medications: Current Facility-Administered Medications  Medication Dose Route Frequency Provider Last Rate Last Dose  . acetaminophen (TYLENOL) tablet 650 mg  650 mg Oral Q6H PRN Laveda Abbe, NP   650 mg at 03/17/18 1148  . alum & mag hydroxide-simeth (MAALOX/MYLANTA) 200-200-20 MG/5ML suspension 30 mL  30 mL Oral Q4H PRN Laveda Abbe, NP      . hydrOXYzine (ATARAX/VISTARIL) tablet 50 mg  50 mg Oral Q6H PRN Micheal Likens, MD      . ibuprofen (ADVIL,MOTRIN) tablet 800 mg  800 mg Oral TID PRN Jackelyn Poling, NP      . lamoTRIgine (LAMICTAL) tablet 25 mg  25 mg Oral Daily Cobos, Rockey Situ, MD   25 mg at 03/20/18 0811  . loperamide (IMODIUM) capsule 2-4 mg  2-4 mg Oral PRN Cobos, Rockey Situ, MD      . LORazepam (ATIVAN) tablet 1 mg  1 mg Oral Q6H PRN Cobos, Fernando A, MD      . lurasidone (LATUDA) tablet 20 mg  20 mg Oral Q breakfast Cobos, Rockey Situ, MD   20 mg at 03/20/18 0811  . magnesium hydroxide (MILK OF MAGNESIA) suspension 30 mL  30 mL Oral Daily PRN Laveda Abbe, NP      . mirtazapine (REMERON) tablet 7.5 mg  7.5 mg Oral QHS Money, Feliz Beam B, FNP   7.5 mg at 03/19/18 2220  . multivitamin with  minerals tablet 1 tablet  1 tablet Oral Daily Cobos, Rockey Situ, MD   1 tablet at 03/20/18 [phone]  . ondansetron (ZOFRAN-ODT) disintegrating tablet 4 mg  4 mg Oral Q6H PRN Cobos, Rockey Situ, MD   4 mg at 03/18/18 1216  . [START ON 03/21/2018] sertraline (ZOLOFT) tablet 50 mg  50 mg Oral Daily  Micheal Likens, MD      . thiamine (VITAMIN B-1) tablet 100 mg  100 mg Oral Daily Cobos, Rockey Situ, MD   100 mg at 03/18/18 1610    Lab Results: No results found for this or any previous visit (from the past 48 hour(s)).  Blood Alcohol level:  Lab Results  Component Value Date   ETH 65 (H) 03/15/2018   ETH  04/18/2010    5        LOWEST DETECTABLE LIMIT FOR SERUM ALCOHOL IS 5 mg/dL FOR MEDICAL PURPOSES ONLY    Metabolic Disorder Labs: Lab Results  Component Value Date   HGBA1C 5.4 03/18/2018   MPG 108.28 03/18/2018   No results found for: PROLACTIN Lab Results  Component Value Date   CHOL 202 (H) 03/18/2018   TRIG 93 03/18/2018   HDL 37 (L) 03/18/2018   CHOLHDL 5.5 03/18/2018   VLDL 19 03/18/2018   LDLCALC 146 (H) 03/18/2018    Physical Findings: AIMS: Facial and Oral Movements Muscles of Facial Expression: None, normal Lips and Perioral Area: None, normal Jaw: None, normal Tongue: None, normal,Extremity Movements Upper (arms, wrists, hands, fingers): None, normal Lower (legs, knees, ankles, toes): None, normal, Trunk Movements Neck, shoulders, hips: None, normal, Overall Severity Severity of abnormal movements (highest score from questions above): None, normal Incapacitation due to abnormal movements: None, normal Patient's awareness of abnormal movements (rate only patient's report): No Awareness, Dental Status Current problems with teeth and/or dentures?: No Does patient usually wear dentures?: No  CIWA:  CIWA-Ar Total: 0 COWS:  COWS Total Score: 0  Musculoskeletal: Strength & Muscle Tone: within normal limits Gait & Station: normal Patient leans:  N/A  Psychiatric Specialty Exam: Physical Exam  Nursing note and vitals reviewed.   Review of Systems  Constitutional: Negative for chills and fever.  Respiratory: Negative for cough and shortness of breath.   Cardiovascular: Negative for chest pain.  Gastrointestinal: Negative for abdominal pain, heartburn, nausea and vomiting.  Psychiatric/Behavioral: Negative for depression, hallucinations and suicidal ideas. The patient is not nervous/anxious and does not have insomnia.     Blood pressure (!) 134/92, pulse 92, temperature 98.3 F (36.8 C), temperature source Oral, resp. rate 20, height 5\' 6"  (1.676 m), weight 106 kg, last menstrual period 03/14/2018, SpO2 100 %.Body mass index is 37.72 kg/m.  General Appearance: Casual and Fairly Groomed  Eye Contact:  Good  Speech:  Clear and Coherent and Normal Rate  Volume:  Normal  Mood:  Euthymic  Affect:  Appropriate and Congruent  Thought Process:  Coherent and Goal Directed  Orientation:  Full (Time, Place, and Person)  Thought Content:  Logical  Suicidal Thoughts:  No  Homicidal Thoughts:  No  Memory:  Immediate;   Fair Recent;   Fair Remote;   Fair  Judgement:  Fair  Insight:  Fair  Psychomotor Activity:  Normal  Concentration:  Concentration: Fair  Recall:  [employer] of Knowledge:  Fair  Language:  Fair  Akathisia:  No  Handed:    AIMS (if indicated):     Assets:  Resilience Social Support  ADL's:  Intact  Cognition:  WNL  Sleep:  Number of Hours: 5.25   Treatment Plan Summary: Daily contact with patient to assess and evaluate symptoms and progress in treatment and Medication management   -Continue inpatient hospitalization  -Bipolar I, current episode depressed severe   -Continue lamictal 25mg  po qDay   -Continue latuda 20mg  po qAM with breakfast   -  Change zoloft 25mg  po qDay to zoloft 50mg  po qDay  -anxiety   -Continue vistaril 50mg  po q6h prn anxiety  -alcohol withdrawal   -Continue CIWA with ativan 1mg   po q6h prn CIWA >10  -insomnia   -Continue remeron 7.5mg  po qhs  -Encourage participation in groups and therapeutic milieu  -disposition planning will be ongoing  Micheal Likens, MD 03/20/2018, 11:54 AM

## 2018-03-20 NOTE — Plan of Care (Addendum)
Patient self inventory- Patient slept well last night, sleep medication was helpful. Appetite has been good, energy level normal, concentration good. Depression, hopelessness, and anxiety rated 3, 3, 1. Denies SI HI AVH. Endorses physical pain rated 1/10 in her head. Patient's goal is "to go home by developing a discharge plan."  Patient is compliant with medications prescribed per provider. Safety Is maintained with 15 minute checks as well as environmental checks. Will continue to monitor.  Problem: Education: Goal: Emotional status will improve Outcome: Progressing Goal: Mental status will improve Outcome: Progressing   Problem: Safety: Goal: Periods of time without injury will increase Outcome: Progressing   Problem: Coping: Goal: Coping ability will improve Outcome: Progressing   Problem: Coping: Goal: Will verbalize feelings Outcome: Not Progressing   Patient remained safe on the unit.

## 2018-03-20 NOTE — [facility] Group Notes (Signed)
Adult Psychoeducational Group Note  Date:  03/20/2018  Time: 4:00 PM  Group Topic/Focus: Mindfulness The purpose of this group is to educate about the physiologic effects of anxiety on the body and the benefits of mindfulness to decrease anxiety. Patients were provided techniques to practice grounding and mindfulness.  Participation Level:  Did Not Attend  Additional Comments:  Patient was invited but declined to attend group.  Sherina Stammer A Emylia Latella 03/20/2018 5:00 PM 

## 2018-03-20 NOTE — Tx Team (Signed)
Interdisciplinary Treatment and Diagnostic Plan Update  03/20/2018 Time of Session: 0830AM Diana Roman MRN: [id number]  Principal Diagnosis: MDD (major depressive disorder), recurrent severe, without psychosis (HCC)  Secondary Diagnoses: Principal Problem:   MDD (major depressive disorder), recurrent severe, without psychosis (HCC)   Current Medications:  Current Facility-Administered Medications  Medication Dose Route Frequency Provider Last Rate Last Dose  . acetaminophen (TYLENOL) tablet 650 mg  650 mg Oral Q6H PRN Laveda Abbe, NP   650 mg at 03/17/18 1148  . alum & mag hydroxide-simeth (MAALOX/MYLANTA) 200-200-20 MG/5ML suspension 30 mL  30 mL Oral Q4H PRN Laveda Abbe, NP      . hydrOXYzine (ATARAX/VISTARIL) tablet 25 mg  25 mg Oral Q6H PRN Cobos, Rockey Situ, MD   25 mg at 03/18/18 1654  . ibuprofen (ADVIL,MOTRIN) tablet 800 mg  800 mg Oral TID PRN Nira Conn A, NP      . lamoTRIgine (LAMICTAL) tablet 25 mg  25 mg Oral Daily Cobos, Rockey Situ, MD   25 mg at 03/20/18 0811  . loperamide (IMODIUM) capsule 2-4 mg  2-4 mg Oral PRN Cobos, Rockey Situ, MD      . LORazepam (ATIVAN) tablet 1 mg  1 mg Oral Q6H PRN Cobos, Fernando A, MD      . lurasidone (LATUDA) tablet 20 mg  20 mg Oral Q breakfast Cobos, Rockey Situ, MD   20 mg at 03/20/18 0811  . magnesium hydroxide (MILK OF MAGNESIA) suspension 30 mL  30 mL Oral Daily PRN Laveda Abbe, NP      . mirtazapine (REMERON) tablet 7.5 mg  7.5 mg Oral QHS Money, Feliz Beam B, FNP   7.5 mg at 03/19/18 2220  . multivitamin with minerals tablet 1 tablet  1 tablet Oral Daily Cobos, Rockey Situ, MD   1 tablet at 03/20/18 [phone]  . ondansetron (ZOFRAN-ODT) disintegrating tablet 4 mg  4 mg Oral Q6H PRN Cobos, Rockey Situ, MD   4 mg at 03/18/18 1216  . sertraline (ZOLOFT) tablet 25 mg  25 mg Oral Daily Cobos, Rockey Situ, MD   25 mg at 03/20/18 5621  . thiamine (VITAMIN B-1) tablet 100 mg  100 mg Oral Daily Cobos, Rockey Situ, MD   100  mg at 03/18/18 3086   PTA Medications: Medications Prior to Admission  Medication Sig Dispense Refill Last Dose  . ALPRAZolam (XANAX) 0.5 MG tablet Take 0.5 mg by mouth at bedtime as needed for anxiety.   03/15/2018 at Unknown time  . clonazePAM (KLONOPIN) 1 MG tablet Take 1 mg by mouth 2 (two) times daily as needed for anxiety.    08/18/2015  . famotidine (PEPCID) 20 MG tablet Take 1 tablet (20 mg total) by mouth 2 (two) times daily. 15 tablet 0   . ibuprofen (ADVIL,MOTRIN) 800 MG tablet Take 1 tablet (800 mg total) by mouth 3 (three) times daily. 21 tablet 0   . lamoTRIgine (LAMICTAL) 200 MG tablet Take 200 mg by mouth daily.   08/18/2015  . Lurasidone HCl (LATUDA PO) Take by mouth.   08/18/2015  . methocarbamol (ROBAXIN) 500 MG tablet Take 1 tablet (500 mg total) by mouth 2 (two) times daily as needed for muscle spasms. 20 tablet 0 08/18/2015  . naproxen (NAPROSYN) 500 MG tablet Take 1 tablet (500 mg total) by mouth 2 (two) times daily. 14 tablet 0   . nitrofurantoin, macrocrystal-monohydrate, (MACROBID) 100 MG capsule Take 1 capsule (100 mg total) by mouth 2 (two) times daily. 14 capsule  0 11/27/2014 at Unknown time  . nortriptyline (PAMELOR) 50 MG capsule Take 50 mg by mouth at bedtime.   08/18/2015  . Vitamin D, Ergocalciferol, (DRISDOL) 50000 units CAPS capsule Take 50,000 Units by mouth 2 (two) times a week.  99     Patient Stressors: Health problems Marital or family conflict Medication change or noncompliance  Patient Strengths: Capable of independent living Psychologist, counselling means Religious Affiliation Supportive family/friends  Treatment Modalities: Medication Management, Group therapy, Case management,  1 to 1 session with clinician, Psychoeducation, Recreational therapy.   Physician Treatment Plan for Primary Diagnosis: MDD (major depressive disorder), recurrent severe, without psychosis (HCC) Long Term Goal(s): Improvement in symptoms so as ready for  discharge Improvement in symptoms so as ready for discharge   Short Term Goals: Ability to identify changes in lifestyle to reduce recurrence of condition will improve Ability to verbalize feelings will improve Ability to identify and develop effective coping behaviors will improve Ability to maintain clinical measurements within normal limits will improve Compliance with prescribed medications will improve Ability to identify triggers associated with substance abuse/mental health issues will improve Ability to identify changes in lifestyle to reduce recurrence of condition will improve Ability to verbalize feelings will improve Ability to disclose and discuss suicidal ideas Ability to identify and develop effective coping behaviors will improve Ability to identify triggers associated with substance abuse/mental health issues will improve  Medication Management: Evaluate patient's response, side effects, and tolerance of medication regimen.  Therapeutic Interventions: 1 to 1 sessions, Unit Group sessions and Medication administration.  Evaluation of Outcomes: Progressing  Physician Treatment Plan for Secondary Diagnosis: Principal Problem:   MDD (major depressive disorder), recurrent severe, without psychosis (HCC)  Long Term Goal(s): Improvement in symptoms so as ready for discharge Improvement in symptoms so as ready for discharge   Short Term Goals: Ability to identify changes in lifestyle to reduce recurrence of condition will improve Ability to verbalize feelings will improve Ability to identify and develop effective coping behaviors will improve Ability to maintain clinical measurements within normal limits will improve Compliance with prescribed medications will improve Ability to identify triggers associated with substance abuse/mental health issues will improve Ability to identify changes in lifestyle to reduce recurrence of condition will improve Ability to verbalize  feelings will improve Ability to disclose and discuss suicidal ideas Ability to identify and develop effective coping behaviors will improve Ability to identify triggers associated with substance abuse/mental health issues will improve     Medication Management: Evaluate patient's response, side effects, and tolerance of medication regimen.  Therapeutic Interventions: 1 to 1 sessions, Unit Group sessions and Medication administration.  Evaluation of Outcomes: Progressing   RN Treatment Plan for Primary Diagnosis: MDD (major depressive disorder), recurrent severe, without psychosis (HCC) Long Term Goal(s): Knowledge of disease and therapeutic regimen to maintain health will improve  Short Term Goals: Ability to remain free from injury will improve, Ability to demonstrate self-control, Ability to verbalize feelings will improve and Ability to identify and develop effective coping behaviors will improve  Medication Management: RN will administer medications as ordered by provider, will assess and evaluate patient's response and provide education to patient for prescribed medication. RN will report any adverse and/or side effects to prescribing provider.  Therapeutic Interventions: 1 on 1 counseling sessions, Psychoeducation, Medication administration, Evaluate responses to treatment, Monitor vital signs and CBGs as ordered, Perform/monitor CIWA, COWS, AIMS and Fall Risk screenings as ordered, Perform wound care treatments as ordered.  Evaluation of Outcomes: Progressing  LCSW Treatment Plan for Primary Diagnosis: MDD (major depressive disorder), recurrent severe, without psychosis (HCC) Long Term Goal(s): Safe transition to appropriate next level of care at discharge, Engage patient in therapeutic group addressing interpersonal concerns.  Short Term Goals: Engage patient in aftercare planning with referrals and resources, Facilitate patient progression through stages of change regarding  substance use diagnoses and concerns and Identify triggers associated with mental health/substance abuse issues  Therapeutic Interventions: Assess for all discharge needs, 1 to 1 time with Social worker, Explore available resources and support systems, Assess for adequacy in community support network, Educate family and significant other(s) on suicide prevention, Complete Psychosocial Assessment, Interpersonal group therapy.  Evaluation of Outcomes: Progressing   Progress in Treatment: Attending groups: Yes. Participating in groups: Yes. Taking medication as prescribed: Yes. Toleration medication: Yes. Family/Significant other contact made: SPE completed with pt; pt declined to consent to collateral contact.  Patient understands diagnosis: Yes. Discussing patient identified problems/goals with staff: Yes. Medical problems stabilized or resolved: Yes. Denies suicidal/homicidal ideation: Yes. Issues/concerns per patient self-inventory: No. Other: n/a   New problem(s) identified: No, Describe:  n/a  New Short Term/Long Term Goal(s): detox, medication management for mood stabilization; elimination of SI thoughts; development of comprehensive mental wellness/sobriety plan.   Patient Goals:  "to pick up some coping skills by going to group and learn how to be okay with talking about mental illness."   Discharge Plan or Barriers: Pt plans toe return home; follow-up at Dr. Evelene Croon for medication management and [facility] for therapy. [facility] pamphlet, Mobile Crisis information, and AA/NA information provided to patient for additional community support and resources.    Reason for Continuation of Hospitalization: Anxiety Depression Medication stabilization  Estimated Length of Stay: Tuesday, 03/21/18  Attendees: Patient: 03/20/2018 11:28 AM  Physician: Dr. Jolyne Loa MD 03/20/2018 11:28 AM  Nursing: Arlyss Repress RN; Marchelle Folks RN 03/20/2018 11:28 AM  RN Care Manager:x 03/20/2018  11:28 AM  Social Worker: Corrie Mckusick LCSW 03/20/2018 11:28 AM  Recreational Therapist: x 03/20/2018 11:28 AM  Other: Armandina Stammer NP 03/20/2018 11:28 AM  Other:  03/20/2018 11:28 AM  Other: 03/20/2018 11:28 AM    Scribe for Treatment Team: Rona Ravens, LCSW 03/20/2018 11:28 AM

## 2018-03-21 MED ORDER — LAMOTRIGINE 25 MG PO TABS: 25.0000 mg | ORAL_TABLET | Freq: Every day | ORAL | 0 refills | Status: DC

## 2018-03-21 MED ORDER — SERTRALINE HCL 50 MG PO TABS: 50.0000 mg | ORAL_TABLET | Freq: Every day | ORAL | 0 refills | Status: DC

## 2018-03-21 MED ORDER — HYDROXYZINE HCL 50 MG PO TABS: 50.0000 mg | ORAL_TABLET | Freq: Four times a day (QID) | ORAL | 0 refills | Status: DC | PRN

## 2018-03-21 MED ORDER — LURASIDONE HCL 20 MG PO TABS: 20.0000 mg | ORAL_TABLET | Freq: Every day | ORAL | 0 refills | Status: DC

## 2018-03-21 MED ORDER — MIRTAZAPINE 7.5 MG PO TABS: 7.5000 mg | ORAL_TABLET | Freq: Every day | ORAL | 0 refills | Status: DC

## 2018-03-21 NOTE — Progress Notes (Signed)
Patient ID: Diana Roman, female   DOB: [birth year], 32 y.o.   MRN: [id number]  D: Pt alert and oriented on the unit.   A: Education, support, and encouragement provided. Discharge summary, medications and follow up appointments reviewed with pt. Suicide prevention resources provided, including "My 3 App." Pt's belongings in locker # 23 returned and belongings sheet signed. . R: Pt denies SI/HI, A/VH, pain, or any concerns at this time. Pt ambulatory on and off unit. Pt discharged to lobby.

## 2018-03-21 NOTE — Discharge Summary (Signed)
Physician Discharge Summary Note  Patient:  Diana Roman is an 32 y.o., female MRN:  [id number] DOB:  12/03/1985 Patient phone:  [phone] (home)  Patient address:   [address] [place] [address],  Total Time spent with patient: 30 minutes  Date of Admission:  03/16/2018 Date of Discharge: 03/21/2018  Reason for Admission:  Depression, suicide attempt via overdose  Principal Problem: Bipolar 1 disorder, depressed, severe (HCC) Discharge Diagnoses: Patient Active Problem List   Diagnosis Date Noted  . Bipolar 1 disorder, depressed, severe (HCC) [F31.4] 03/16/2018  . Suicide attempt (HCC) [T14.91XA]     Past Psychiatric History: see H&P  Past Medical History:  Past Medical History:  Diagnosis Date  . Abnormal Pap smear 2011  . Anxiety   . Bipolar 1 disorder (HCC)   . Depression    on meds, doing well  . H/O bacterial infection   . H/O varicella   . Headache(784.0)    Frequent  . Hypertension   . Infection    UTI  . Migraine   . No pertinent past medical history   . Sickle cell trait (HCC)   . Vaginal Pap smear, abnormal    repeat was normal  . Yeast infection     Past Surgical History:  Procedure Laterality Date  . DENTAL SURGERY    . NO PAST SURGERIES     Family History:  Family History  Problem Relation Age of Onset  . Hypertension Mother   . Diabetes Maternal Grandmother   . Glaucoma Maternal Grandmother   . Hypertension Sister    Family Psychiatric  History: see H&P Social History:  Social History   Substance and Sexual Activity  Alcohol Use Yes   Comment: 2/wk     Social History   Substance and Sexual Activity  Drug Use No    Social History   Socioeconomic History  . Marital status: Married    Spouse name: Not on file  . Number of children: Not on file  . Years of education: Not on file  . Highest education level: Not on file  Occupational History  . Not on file  Social Needs  . Financial resource strain:  Not on file  . Food insecurity:    Worry: Not on file    Inability: Not on file  . Transportation needs:    Medical: Not on file    Non-medical: Not on file  Tobacco Use  . Smoking status: Never Smoker  . Smokeless tobacco: Never Used  Substance and Sexual Activity  . Alcohol use: Yes    Comment: 2/wk  . Drug use: No  . Sexual activity: Yes    Birth control/protection: None  Lifestyle  . Physical activity:    Days per week: Not on file    Minutes per session: Not on file  . Stress: Not on file  Relationships  . Social connections:    Talks on phone: Not on file    Gets together: Not on file    Attends religious service: Not on file    Active member of club or organization: Not on file    Attends meetings of clubs or organizations: Not on file    Relationship status: Not on file  Other Topics Concern  . Not on file  Social History Narrative  . Not on file    Hospital Course:    History as per psychiatric intake: 32 year old female who presented to ED via EMS  aftera passerby found herslumped over on a park benchaftersuicide attempt by overdosing on Xanax and alcohol . Reports she took about 14 tablets, which was " what was left in the bottle". Of note, is prescribed Xanax for anxiety, but states she takes only occasionally and denies any misuse or abuse. Also, she denies alcohol abuse, drinks only occasionally.  She reports she has been depressed x 2 months, and feels her mood has been worsening. Reports she had been experiencing passive SI recently but did not have plan or intention until that day. States " I was just tired of life".  Patient does not identify any specific triggers or stressors for worsening depression, States she has been off of her prescribed psychiatric medications sinceMarch 2019due to financial /insurance constraints .  As per evaluation today: Today upon evaluation, pt shares, "I'm doing good." She denies any specific concerns. She is sleeping  well. Her appetite is good. She denies other physical complaints. She denies SI/HI/AH/VH. She is tolerating her medications well, and she is in agreement to continue her current regimen without changes. She plans to follow up with her previous outpatient provider, Dr. Evelene Croon. We discussed about not restarting xanax, and pt was in agreement with that recommendation. She will also be referred to therapy at [facility]. She plans to return to stay at home. She was able to engage in safety planning including plan to return to [facility] or contact emergency services if she feels unable to maintain her own safety or the safety of others. Pt had no further questions, comments, or concerns.   The patient is at low risk of imminent suicide. Patient denied thoughts, intent, or plan for harm to self or others, expressed significant future orientation, and expressed an ability to mobilize assistance for her needs. She is presently void of any contributing psychiatric symptoms, cognitive difficulties, or substance use which would elevate her risk for lethality. Chronic risk for lethality is elevated in light of poor social support, poor adherence, and impulsivity. The chronic risk is presently mitigated by her ongoing desire and engagement in [facility] treatment and mobilization of support from family and friends. Chronic risk may elevate if she experiences any significant loss or worsening of symptoms, which can be managed and monitored through outpatient providers. At this time, acute risk for lethality is low and she is stable for ongoing outpatient management.    Modifiable risk factors were addressed during this hospitalization through appropriate pharmacotherapy and establishment of outpatient follow-up treatment. Some risk factors for suicide are situational (i.e. Unstable social support) or related personality pathology (i.e. Poor coping mechanisms) and thus cannot be further mitigated by continued  hospitalization in this setting.   Physical Findings: AIMS: Facial and Oral Movements Muscles of Facial Expression: None, normal Lips and Perioral Area: None, normal Jaw: None, normal Tongue: None, normal,Extremity Movements Upper (arms, wrists, hands, fingers): None, normal Lower (legs, knees, ankles, toes): None, normal, Trunk Movements Neck, shoulders, hips: None, normal, Overall Severity Severity of abnormal movements (highest score from questions above): None, normal Incapacitation due to abnormal movements: None, normal Patient's awareness of abnormal movements (rate only patient's report): No Awareness, Dental Status Current problems with teeth and/or dentures?: No Does patient usually wear dentures?: No  CIWA:  CIWA-Ar Total: 0 COWS:  COWS Total Score: 0  Musculoskeletal: Strength & Muscle Tone: within normal limits Gait & Station: normal Patient leans: N/A  Psychiatric Specialty Exam: Physical Exam  Nursing note and vitals reviewed.   Review  of Systems  Constitutional: Negative for chills and fever.  Respiratory: Negative for cough and shortness of breath.   Cardiovascular: Negative for chest pain.  Gastrointestinal: Negative for abdominal pain, heartburn, nausea and vomiting.  Psychiatric/Behavioral: Negative for depression, hallucinations and suicidal ideas. The patient is not nervous/anxious and does not have insomnia.     Blood pressure 126/81, pulse 95, temperature 98.4 F (36.9 C), temperature source Oral, resp. rate 20, height 5\' 6"  (1.676 m), weight 106 kg, last menstrual period 03/14/2018, SpO2 100 %.Body mass index is 37.72 kg/m.  General Appearance: Casual and Fairly Groomed  Eye Contact:  Good  Speech:  Clear and Coherent and Normal Rate  Volume:  Normal  Mood:  Euthymic  Affect:  Appropriate and Congruent  Thought Process:  Coherent and Goal Directed  Orientation:  Full (Time, Place, and Person)  Thought Content:  Logical  Suicidal Thoughts:  No   Homicidal Thoughts:  No  Memory:  Immediate;   Fair Recent;   Fair Remote;   Fair  Judgement:  Fair  Insight:  Fair  Psychomotor Activity:  Normal  Concentration:  Concentration: Fair  Recall:  Fair  Fund of Knowledge:  Fair  Language:  Fair  Akathisia:  No  Handed:    AIMS (if indicated):     Assets:  Resilience Social Support  ADL's:  Intact  Cognition:  WNL  Sleep:  Number of Hours: 6     Have you used any form of tobacco in the last 30 days? (Cigarettes, Smokeless Tobacco, Cigars, and/or Pipes): No  Has this patient used any form of tobacco in the last 30 days? (Cigarettes, Smokeless Tobacco, Cigars, and/or Pipes) Yes, Yes, A prescription for an FDA-approved tobacco cessation medication was offered at discharge and the patient refused  Blood Alcohol level:  Lab Results  Component Value Date   ETH 65 (H) 03/15/2018   ETH  04/18/2010    5        LOWEST DETECTABLE LIMIT FOR SERUM ALCOHOL IS 5 mg/dL FOR MEDICAL PURPOSES ONLY    Metabolic Disorder Labs:  Lab Results  Component Value Date   HGBA1C 5.4 03/18/2018   MPG 108.28 03/18/2018   No results found for: PROLACTIN Lab Results  Component Value Date   CHOL 202 (H) 03/18/2018   TRIG 93 03/18/2018   HDL 37 (L) 03/18/2018   CHOLHDL 5.5 03/18/2018   VLDL 19 03/18/2018   LDLCALC 146 (H) 03/18/2018    See Psychiatric Specialty Exam and Suicide Risk Assessment completed by Attending Physician prior to discharge.  Discharge destination:  Home  Is patient on multiple antipsychotic therapies at discharge:  No   Has Patient had three or more failed trials of antipsychotic monotherapy by history:  No  Recommended Plan for Multiple Antipsychotic Therapies: NA   Allergies as of 03/21/2018   No Known Allergies     Medication List    STOP taking these medications   ALPRAZolam 0.5 MG tablet Commonly known as:  XANAX   clonazePAM 1 MG tablet Commonly known as:  KLONOPIN   methocarbamol 500 MG  tablet Commonly known as:  ROBAXIN   nitrofurantoin (macrocrystal-monohydrate) 100 MG capsule Commonly known as:  MACROBID   nortriptyline 50 MG capsule Commonly known as:  PAMELOR     TAKE these medications     Indication  famotidine 20 MG tablet Commonly known as:  PEPCID Take 1 tablet (20 mg total) by mouth 2 (two) times daily.  Indication:  Gastroesophageal Reflux Disease  hydrOXYzine 50 MG tablet Commonly known as:  ATARAX/VISTARIL Take 1 tablet (50 mg total) by mouth every 6 (six) hours as needed for anxiety.  Indication:  Feeling Anxious   ibuprofen 800 MG tablet Commonly known as:  ADVIL,MOTRIN Take 1 tablet (800 mg total) by mouth 3 (three) times daily.  Indication:  Moderate pain   lamoTRIgine 25 MG tablet Commonly known as:  LAMICTAL Take 1 tablet (25 mg total) by mouth daily. What changed:    medication strength  how much to take  Indication:  Manic-Depression   lurasidone 20 MG Tabs tablet Commonly known as:  LATUDA Take 1 tablet (20 mg total) by mouth daily with breakfast. What changed:    medication strength  how much to take  when to take this  Indication:  Depressive Phase of Manic-Depression   mirtazapine 7.5 MG tablet Commonly known as:  REMERON Take 1 tablet (7.5 mg total) by mouth at bedtime.  Indication:  insomnia/depression   naproxen 500 MG tablet Commonly known as:  NAPROSYN Take 1 tablet (500 mg total) by mouth 2 (two) times daily.  Indication:  Pain   sertraline 50 MG tablet Commonly known as:  ZOLOFT Take 1 tablet (50 mg total) by mouth daily.  Indication:  Depressive episode of Bipolar Disorder   Vitamin D (Ergocalciferol) 50000 units Caps capsule Commonly known as:  DRISDOL Take 50,000 Units by mouth 2 (two) times a week.  Indication:  Vitamin D Deficiency      Follow-up Information    Milagros Evener, MD. Go on 03/23/2018.   Specialty:  Psychiatry Why:  Your hospital follow up appointment is Thursday, November 7  at 8:30 a.m.  Contact information: [address]Tyson Babinski [place] [address] [phone]        [facility] Follow up.   Specialty:  Behavioral Health Why:  Therapy appt on Wednesday, 11/6 at 10:00AM with Alema. Please bring: Photo ID and medicaid card to this appt. If you must cancel or reschedule, please do so within 24 hours of appt. Thank you.  Contact information: [address] [place] [address] [phone]           Follow-up recommendations:  Activity:  as tolerated Diet:  normal Tests:  NA Other:  see above for DC plan  Comments:    Signed: Micheal Likens, MD 03/21/2018, 10:30 AM

## 2018-03-21 NOTE — Progress Notes (Signed)
  [facility] Adult Case Management Discharge Plan :  Will you be returning to the same living situation after discharge:  Yes,  home At discharge, do you have transportation home?: Yes,  husband Do you have the ability to pay for your medications: Yes,  [facility]  Release of information consent forms completed and submitted to medical records by CSW.   Patient to Follow up at: Follow-up Information    Diana Evener, MD. Go on 03/23/2018.   Specialty:  Psychiatry Why:  Your hospital follow up appointment is Thursday, November 7 at 8:30 a.m.  Contact information: [address]Tyson Babinski [place] [address] [phone]        [facility] Follow up.   Specialty:  Behavioral Health Why:  Therapy appt on Wednesday, 11/6 at 10:00AM with Alema. Please bring: Photo ID and medicaid card to this appt. If you must cancel or reschedule, please do so within 24 hours of appt. Thank you.  Contact information: [address] [place] [address] [phone]           Next level of care provider has access to [facility]:no  Safety Planning and Suicide Prevention discussed: Yes,  SPE completed with pt; contact attempts made with pt's husband. SPI pamphlet and mobile crisis information provided to pt.   Have you used any form of tobacco in the last 30 days? (Cigarettes, Smokeless Tobacco, Cigars, and/or Pipes): No  Has patient been referred to the Quitline?: N/A patient is not a smoker  Patient has been referred for addiction treatment: Yes  Rona Ravens, LCSW 03/21/2018, 8:43 AM

## 2018-03-21 NOTE — [facility] Suicide Risk Assessment (Signed)
[facility] Discharge Suicide Risk Assessment   Principal Problem: Bipolar 1 disorder, depressed, severe (HCC) Discharge Diagnoses:  Patient Active Problem List   Diagnosis Date Noted  . Bipolar 1 disorder, depressed, severe (HCC) [F31.4] 03/16/2018  . Suicide attempt (HCC) [T14.91XA]     Total Time spent with patient: 30 minutes   Psychiatric Specialty Exam: Review of Systems  Constitutional: Negative for chills and fever.  Respiratory: Negative for cough and shortness of breath.   Cardiovascular: Negative for chest pain.  Gastrointestinal: Negative for abdominal pain, heartburn, nausea and vomiting.  Psychiatric/Behavioral: Negative for depression, hallucinations and suicidal ideas. The patient is not nervous/anxious and does not have insomnia.     Blood pressure 126/81, pulse 95, temperature 98.4 F (36.9 C), temperature source Oral, resp. rate 20, height 5\' 6"  (1.676 m), weight 106 kg, last menstrual period 03/14/2018, SpO2 100 %.Body mass index is 37.72 kg/m.   Mental Status Per Nursing Assessment::   On Admission:  Suicidal ideation indicated by patient, Self-harm thoughts  Demographic Factors:  Divorced or widowed and Low socioeconomic status  Loss Factors: Financial problems/change in socioeconomic status  Historical Factors: Prior suicide attempts, Family history of mental illness or substance abuse and Impulsivity  Risk Reduction Factors:   Sense of responsibility to family, Positive social support, Positive therapeutic relationship and Positive coping skills or problem solving skills  Continued Clinical Symptoms:  Bipolar Disorder:   Depressive phase  Cognitive Features That Contribute To Risk:  None    Suicide Risk:  Minimal: No identifiable suicidal ideation.  Patients presenting with no risk factors but with morbid ruminations; may be classified as minimal risk based on the severity of the depressive symptoms  Follow-up Information    Milagros Evener, MD. Go on  03/23/2018.   Specialty:  Psychiatry Why:  Your hospital follow up appointment is Thursday, November 7 at 8:30 a.m.  Contact information: [address] [place] [address] [phone]        [facility] Follow up.   Specialty:  Behavioral Health Why:  Therapy appt on Wednesday, 11/6 at 10:00AM with Alema. Please bring: Photo ID and medicaid card to this appt. If you must cancel or reschedule, please do so within 24 hours of appt. Thank you.  Contact information: [address] [place] [address] [phone]          Plan Of Care/Follow-up recommendations:  Activity:  as tolerated Diet:  normal Tests:  NA Other:  see above for DC plan  Micheal Likens, MD 03/21/2018, 10:34 AM

## 2018-05-29 ENCOUNTER — Encounter (HOSPITAL_COMMUNITY): Payer: Self-pay | Admitting: Family Medicine

## 2018-05-29 ENCOUNTER — Ambulatory Visit (HOSPITAL_COMMUNITY)
Admission: EM | Admit: 2018-05-29 | Discharge: 2018-05-29 | Disposition: A | Discharge status: Home / Self Care | Payer: Medicaid Other | Attending: Family Medicine | Admitting: Family Medicine

## 2018-05-29 DIAGNOSIS — J101 Influenza due to other identified influenza virus with other respiratory manifestations: Secondary | ICD-10-CM

## 2018-05-29 MED ORDER — ONDANSETRON 8 MG PO TBDP: 8.0000 mg | ORAL_TABLET | Freq: Three times a day (TID) | ORAL | 0 refills | Status: DC | PRN

## 2018-05-29 MED ORDER — BENZONATATE 100 MG PO CAPS: 100.0000 mg | ORAL_CAPSULE | Freq: Three times a day (TID) | ORAL | 0 refills | Status: DC | PRN

## 2018-05-29 NOTE — ED Triage Notes (Signed)
Evaluated by provider

## 2018-05-29 NOTE — ED Provider Notes (Signed)
[facility]    CSN: [id number] Arrival date & time: 05/29/18  1507     History   Chief Complaint No chief complaint on file.   HPI Diana Roman is a 33 y.o. female.   33 year old woman who is here for evaluation of cold symptoms.  [facility] an [facility].  Patient has had a cough for over a month.  She started getting diaphoresis, nausea, vomiting, myalgia last Friday (3 days ago).  She had some spaghetti last night after which she vomited again.  She has had no abdominal pain.  No ear pain.  She says her fever went to 105.     Past Medical History:  Diagnosis Date  . Abnormal Pap smear 2011  . Anxiety   . Bipolar 1 disorder (HCC)   . Depression    on meds, doing well  . H/O bacterial infection   . H/O varicella   . Headache(784.0)    Frequent  . Hypertension   . Infection    UTI  . Migraine   . No pertinent past medical history   . Sickle cell trait (HCC)   . Vaginal Pap smear, abnormal    repeat was normal  . Yeast infection     Patient Active Problem List   Diagnosis Date Noted  . Bipolar 1 disorder, depressed, severe (HCC) 03/16/2018  . Suicide attempt [facility](HCC)     Past Surgical History:  Procedure Laterality Date  . DENTAL SURGERY    . NO PAST SURGERIES      OB History    Gravida  1   Para  1   Term  1   Preterm  0   AB  0   Living  1     SAB  0   TAB  0   Ectopic  0   Multiple  0   Live Births  1            Home Medications    Prior to Admission medications   Medication Sig Start Date End Date Taking? Authorizing Provider  benzonatate (TESSALON) 100 MG capsule Take 1-2 capsules (100-200 mg total) by mouth 3 (three) times daily as needed for cough. 05/29/18   Elvina SidleLauenstein, Krisann Mckenna, MD  famotidine (PEPCID) 20 MG tablet Take 1 tablet (20 mg total) by mouth 2 (two) times daily. 12/08/16   Renne CriglerGeiple, Joshua, PA-C  hydrOXYzine (ATARAX/VISTARIL) 50 MG tablet Take 1 tablet (50 mg total) by mouth  every 6 (six) hours as needed for anxiety. 03/21/18   Micheal Likensainville, Christopher T, MD  ibuprofen (ADVIL,MOTRIN) 800 MG tablet Take 1 tablet (800 mg total) by mouth 3 (three) times daily. 03/14/18   Wieters, Hallie C, PA-C  lamoTRIgine (LAMICTAL) 25 MG tablet Take 1 tablet (25 mg total) by mouth daily. 03/21/18   Micheal Likensainville, Christopher T, MD  lurasidone (LATUDA) 20 MG TABS tablet Take 1 tablet (20 mg total) by mouth daily with breakfast. 03/21/18   Micheal Likensainville, Christopher T, MD  mirtazapine (REMERON) 7.5 MG tablet Take 1 tablet (7.5 mg total) by mouth at bedtime. 03/21/18   Micheal Likensainville, Christopher T, MD  naproxen (NAPROSYN) 500 MG tablet Take 1 tablet (500 mg total) by mouth 2 (two) times daily. 12/08/16   Renne CriglerGeiple, Joshua, PA-C  ondansetron (ZOFRAN-ODT) 8 MG disintegrating tablet Take 1 tablet (8 mg total) by mouth every 8 (eight) hours as needed for nausea. 05/29/18   Elvina SidleLauenstein, Kaven Cumbie, MD  sertraline (ZOLOFT) 50 MG tablet Take  1 tablet (50 mg total) by mouth daily. 03/21/18   Micheal Likens, MD  Vitamin D, Ergocalciferol, (DRISDOL) 50000 units CAPS capsule Take 50,000 Units by mouth 2 (two) times a week. 12/03/16   [provider]    Family History Family History  Problem Relation Age of Onset  . Hypertension Mother   . Diabetes Maternal Grandmother   . Glaucoma Maternal Grandmother   . Hypertension Sister     Social History Social History   Tobacco Use  . Smoking status: Never Smoker  . Smokeless tobacco: Never Used  Substance Use Topics  . Alcohol use: Yes    Comment: 2/wk  . Drug use: No     Allergies   Patient has no known allergies.   Review of Systems Review of Systems   Physical Exam Triage Vital Signs ED Triage Vitals  Enc Vitals Group     BP      Pulse      Resp      Temp      Temp src      SpO2      Weight      Height      Head Circumference      Peak Flow      Pain Score      Pain Loc      Pain Edu?      Excl. in GC?    No data  found.  Updated Vital Signs BP (!) 141/105 (BP Location: Left Arm)   Pulse 99   Temp 99.9 F (37.7 C) (Oral)   Resp 20   SpO2 99%    Physical Exam Vitals signs and nursing note reviewed.  Constitutional:      Appearance: Normal appearance.  HENT:     Head: Normocephalic.     Right Ear: Tympanic membrane and external ear normal.     Left Ear: Tympanic membrane and external ear normal.     Nose: Nose normal.     Mouth/Throat:     Pharynx: Oropharynx is clear.  Eyes:     Conjunctiva/sclera: Conjunctivae normal.  Neck:     Musculoskeletal: Normal range of motion and neck supple.  Cardiovascular:     Rate and Rhythm: Normal rate and regular rhythm.     Heart sounds: Normal heart sounds.  Pulmonary:     Effort: Pulmonary effort is normal.     Breath sounds: Normal breath sounds.  Musculoskeletal: Normal range of motion.  Skin:    General: Skin is warm and dry.  Neurological:     General: No focal deficit present.     Mental Status: She is alert and oriented to person, place, and time.  Psychiatric:        Mood and Affect: Mood normal.      UC Treatments / Results  Labs (all labs ordered are listed, but only abnormal results are displayed) Labs Reviewed - No data to display  EKG None  Radiology No results found.  Procedures Procedures (including critical care time)  Medications Ordered in UC Medications - No data to display  Initial Impression / Assessment and Plan / UC Course  I have reviewed the triage vital signs and the nursing notes.  Pertinent labs & imaging results that were available during my care of the patient were reviewed by me and considered in my medical decision making (see chart for details).    Final Clinical Impressions(s) / UC Diagnoses   Final diagnoses:  Influenza B  Discharge Instructions   None    ED Prescriptions    Medication Sig Dispense Auth. Provider   benzonatate (TESSALON) 100 MG capsule Take 1-2 capsules (100-200  mg total) by mouth 3 (three) times daily as needed for cough. 40 capsule Elvina Sidle, MD   ondansetron (ZOFRAN-ODT) 8 MG disintegrating tablet Take 1 tablet (8 mg total) by mouth every 8 (eight) hours as needed for nausea. 12 tablet Elvina Sidle, MD     Controlled Substance Prescriptions [place] Controlled Substance Registry consulted? Not Applicable   Elvina Sidle, MD 05/29/18 [id number]

## 2018-05-31 ENCOUNTER — Emergency Department (HOSPITAL_COMMUNITY): Payer: Medicaid Other

## 2018-05-31 ENCOUNTER — Encounter (HOSPITAL_COMMUNITY): Payer: Self-pay | Admitting: Emergency Medicine

## 2018-05-31 ENCOUNTER — Emergency Department (HOSPITAL_COMMUNITY)
Admission: EM | Admit: 2018-05-31 | Discharge: 2018-05-31 | Disposition: A | Discharge status: Home / Self Care | Payer: Medicaid Other | Attending: Emergency Medicine | Admitting: Emergency Medicine

## 2018-05-31 ENCOUNTER — Other Ambulatory Visit: Payer: Self-pay

## 2018-05-31 DIAGNOSIS — I10 Essential (primary) hypertension: Secondary | ICD-10-CM | POA: Diagnosis not present

## 2018-05-31 DIAGNOSIS — R05 Cough: Secondary | ICD-10-CM | POA: Diagnosis present

## 2018-05-31 DIAGNOSIS — R112 Nausea with vomiting, unspecified: Secondary | ICD-10-CM | POA: Insufficient documentation

## 2018-05-31 DIAGNOSIS — Z79899 Other long term (current) drug therapy: Secondary | ICD-10-CM | POA: Insufficient documentation

## 2018-05-31 DIAGNOSIS — J189 Pneumonia, unspecified organism: Secondary | ICD-10-CM

## 2018-05-31 MED ORDER — AMOXICILLIN-POT CLAVULANATE 875-125 MG PO TABS: 1.0000 | ORAL_TABLET | Freq: Two times a day (BID) | ORAL | 0 refills | Status: DC

## 2018-05-31 MED ORDER — ACETAMINOPHEN 325 MG PO TABS
650.0000 mg | ORAL_TABLET | Freq: Once | ORAL | Status: AC
  Administered 2018-05-31: 650 mg via ORAL
  Filled 2018-05-31: qty 2

## 2018-05-31 MED ORDER — AZITHROMYCIN 250 MG PO TABS
500.0000 mg | ORAL_TABLET | Freq: Once | ORAL | Status: AC
  Administered 2018-05-31: 500 mg via ORAL
  Filled 2018-05-31: qty 2

## 2018-05-31 MED ORDER — IPRATROPIUM-ALBUTEROL 0.5-2.5 (3) MG/3ML IN SOLN
3.0000 mL | Freq: Once | RESPIRATORY_TRACT | Status: AC
  Administered 2018-05-31: 3 mL via RESPIRATORY_TRACT
  Filled 2018-05-31: qty 3

## 2018-05-31 MED ORDER — METOCLOPRAMIDE HCL 10 MG PO TABS: 10.0000 mg | ORAL_TABLET | Freq: Four times a day (QID) | ORAL | 0 refills | Status: DC | PRN

## 2018-05-31 MED ORDER — AZITHROMYCIN 250 MG PO TABS: ORAL_TABLET | ORAL | 0 refills | Status: DC

## 2018-05-31 MED ORDER — AEROCHAMBER PLUS FLO-VU MEDIUM MISC
1.0000 | Freq: Once | Status: DC
  Filled 2018-05-31: qty 1

## 2018-05-31 MED ORDER — METOCLOPRAMIDE HCL 10 MG PO TABS
10.0000 mg | ORAL_TABLET | Freq: Once | ORAL | Status: AC
  Administered 2018-05-31: 10 mg via ORAL
  Filled 2018-05-31: qty 1

## 2018-05-31 MED ORDER — ALBUTEROL SULFATE HFA 108 (90 BASE) MCG/ACT IN AERS
2.0000 | INHALATION_SPRAY | Freq: Once | RESPIRATORY_TRACT | Status: AC
  Administered 2018-05-31: 2 via RESPIRATORY_TRACT
  Filled 2018-05-31: qty 6.7

## 2018-05-31 MED ORDER — AMOXICILLIN-POT CLAVULANATE 875-125 MG PO TABS
1.0000 | ORAL_TABLET | Freq: Once | ORAL | Status: AC
  Administered 2018-05-31: 1 via ORAL
  Filled 2018-05-31: qty 1

## 2018-05-31 MED ORDER — ALBUTEROL SULFATE HFA 108 (90 BASE) MCG/ACT IN AERS: 2.0000 | INHALATION_SPRAY | RESPIRATORY_TRACT | 1 refills | Status: DC | PRN

## 2018-05-31 NOTE — Discharge Instructions (Signed)
You may have diarrhea from the antibiotics.  It is very important that you continue to take the antibiotics even if you get diarrhea unless a medical professional tells you that you may stop taking them.  If you stop too early the bacteria you are being treated for will become stronger and you may need different, more powerful antibiotics that have more side effects and worsening diarrhea.  Please stay well hydrated and consider probiotics as they may decrease the severity of your diarrhea.  Please be aware that if you take any hormonal contraception (birth control pills, nexplanon, the ring, etc) that your birth control will not work while you are taking antibiotics and you need to use back up protection as directed on the birth control medication information insert.  ° °Please take Ibuprofen (Advil, motrin) and Tylenol (acetaminophen) to relieve your pain.  You may take up to 600 MG (3 pills) of normal strength ibuprofen every 8 hours as needed.  In between doses of ibuprofen you make take tylenol, up to 1,000 mg (two extra strength pills).  Do not take more than 3,000 mg tylenol in a 24 hour period.  Please check all medication labels as many medications such as pain and cold medications may contain tylenol.  Do not drink alcohol while taking these medications.  Do not take other NSAID'S while taking ibuprofen (such as aleve or naproxen).  Please take ibuprofen with food to decrease stomach upset. ° °

## 2018-05-31 NOTE — ED Notes (Signed)
E-signature not available, pt verbalized understanding of DC instructions and prescriptions 

## 2018-05-31 NOTE — ED Provider Notes (Signed)
[facility] EMERGENCY DEPARTMENT Provider Note   CSN: [id number] Arrival date & time: 05/31/18  1503     History   Chief Complaint Chief Complaint  Patient presents with  . cough  . Emesis  . Generalized Body Aches    HPI Diana Roman is a 33 y.o. female with a past medical history of bipolar 1, who presents today for evaluation of nausea,  cough, vomiting, fatigue, body aches, and generally feeling poorly.   She went to urgent care and was diagnosed with the flu and given rx for tessalon and zofran and sent home.  She reports inability to keep down food or water.  She reports she has taken the zofran.    She has reportedly had her cough since December 15th.   HPI  Past Medical History:  Diagnosis Date  . Abnormal Pap smear 2011  . Anxiety   . Bipolar 1 disorder (HCC)   . Depression    on meds, doing well  . H/O bacterial infection   . H/O varicella   . Headache(784.0)    Frequent  . Hypertension   . Infection    UTI  . Migraine   . No pertinent past medical history   . Sickle cell trait (HCC)   . Vaginal Pap smear, abnormal    repeat was normal  . Yeast infection     Patient Active Problem List   Diagnosis Date Noted  . Bipolar 1 disorder, depressed, severe (HCC) 03/16/2018  . Suicide attempt [facility])     Past Surgical History:  Procedure Laterality Date  . DENTAL SURGERY    . NO PAST SURGERIES       OB History    Gravida  1   Para  1   Term  1   Preterm  0   AB  0   Living  1     SAB  0   TAB  0   Ectopic  0   Multiple  0   Live Births  1            Home Medications    Prior to Admission medications   Medication Sig Start Date End Date Taking? Authorizing Provider  albuterol (PROVENTIL HFA;VENTOLIN HFA) 108 (90 Base) MCG/ACT inhaler Inhale 2 puffs into the lungs every 4 (four) hours as needed for wheezing or shortness of breath. 05/31/18   Cristina Gong, PA-C  amoxicillin-clavulanate (AUGMENTIN)  875-125 MG tablet Take 1 tablet by mouth every 12 (twelve) hours. 05/31/18   Cristina Gong, PA-C  azithromycin (ZITHROMAX) 250 MG tablet Starting 06/01/17 take 1 pill once a day until finished. 05/31/18   Cristina Gong, PA-C  benzonatate (TESSALON) 100 MG capsule Take 1-2 capsules (100-200 mg total) by mouth 3 (three) times daily as needed for cough. 05/29/18   Elvina Sidle, MD  famotidine (PEPCID) 20 MG tablet Take 1 tablet (20 mg total) by mouth 2 (two) times daily. 12/08/16   [name], PA-C  hydrOXYzine (ATARAX/VISTARIL) 50 MG tablet Take 1 tablet (50 mg total) by mouth every 6 (six) hours as needed for anxiety. 03/21/18   Micheal Likens, MD  ibuprofen (ADVIL,MOTRIN) 800 MG tablet Take 1 tablet (800 mg total) by mouth 3 (three) times daily. 03/14/18   Wieters, Hallie C, PA-C  lamoTRIgine (LAMICTAL) 25 MG tablet Take 1 tablet (25 mg total) by mouth daily. 03/21/18   Micheal Likens, MD  lurasidone (LATUDA) 20 MG TABS tablet Take  1 tablet (20 mg total) by mouth daily with breakfast. 03/21/18   Micheal Likensainville, Christopher T, MD  metoCLOPramide (REGLAN) 10 MG tablet Take 1 tablet (10 mg total) by mouth every 6 (six) hours as needed for nausea or vomiting. 05/31/18   Cristina GongHammond, Cindel Daugherty W, PA-C  mirtazapine (REMERON) 7.5 MG tablet Take 1 tablet (7.5 mg total) by mouth at bedtime. 03/21/18   Micheal Likensainville, Christopher T, MD  naproxen (NAPROSYN) 500 MG tablet Take 1 tablet (500 mg total) by mouth 2 (two) times daily. 12/08/16   Renne CriglerGeiple, Joshua, PA-C  ondansetron (ZOFRAN-ODT) 8 MG disintegrating tablet Take 1 tablet (8 mg total) by mouth every 8 (eight) hours as needed for nausea. 05/29/18   Elvina SidleLauenstein, Kurt, MD  sertraline (ZOLOFT) 50 MG tablet Take 1 tablet (50 mg total) by mouth daily. 03/21/18   Micheal Likensainville, Christopher T, MD  Vitamin D, Ergocalciferol, (DRISDOL) 50000 units CAPS capsule Take 50,000 Units by mouth 2 (two) times a week. 12/03/16   [provider]    Family  History Family History  Problem Relation Age of Onset  . Hypertension Mother   . Diabetes Maternal Grandmother   . Glaucoma Maternal Grandmother   . Hypertension Sister     Social History Social History   Tobacco Use  . Smoking status: Never Smoker  . Smokeless tobacco: Never Used  Substance Use Topics  . Alcohol use: Yes    Comment: 2/wk  . Drug use: No     Allergies   Patient has no known allergies.   Review of Systems Review of Systems  Constitutional: Negative for chills and fever.  HENT: Positive for sore throat. Negative for congestion and ear pain.   Respiratory: Positive for cough. Negative for chest tightness and shortness of breath.   Cardiovascular: Negative for chest pain.  Gastrointestinal: Positive for nausea and vomiting. Negative for abdominal pain, constipation and diarrhea.  Genitourinary: Negative for dysuria.  Musculoskeletal: Positive for arthralgias and myalgias.  Neurological: Negative for headaches.  All other systems reviewed and are negative.    Physical Exam Updated Vital Signs BP (!) 137/97 (BP Location: Right Arm)   Pulse 82   Temp 98.6 F (37 C)   Resp 18   Ht 5\' 6"  (1.676 m)   Wt 83.9 kg   LMP 05/03/2018   SpO2 99%   BMI 29.86 kg/m   Physical Exam Vitals signs and nursing note reviewed.  Constitutional:      General: She is not in acute distress.    Appearance: She is well-developed. She is not diaphoretic.  HENT:     Head: Normocephalic and atraumatic.     Right Ear: Tympanic membrane, ear canal and external ear normal.     Left Ear: Tympanic membrane, ear canal and external ear normal.     Nose: Nose normal.     Mouth/Throat:     Mouth: Mucous membranes are moist.     Pharynx: Uvula midline. No oropharyngeal exudate.     Tonsils: No tonsillar exudate.  Eyes:     General: No scleral icterus.    Conjunctiva/sclera: Conjunctivae normal.  Neck:     Musculoskeletal: Normal range of motion and neck supple.    Cardiovascular:     Rate and Rhythm: Normal rate and regular rhythm.     Pulses: Normal pulses.     Heart sounds: Normal heart sounds. No murmur.  Pulmonary:     Effort: Pulmonary effort is normal. No respiratory distress.     Breath sounds: Normal  breath sounds. No wheezing.  Abdominal:     General: There is no distension.     Palpations: Abdomen is soft.     Tenderness: There is no abdominal tenderness. There is no guarding or rebound.  Musculoskeletal:     Right lower leg: No edema.     Left lower leg: No edema.  Lymphadenopathy:     Cervical: No cervical adenopathy.  Skin:    General: Skin is warm and dry.  Neurological:     Mental Status: She is alert.  Psychiatric:        Behavior: Behavior normal.      ED Treatments / Results  Labs (all labs ordered are listed, but only abnormal results are displayed) Labs Reviewed - No data to display  EKG None  Radiology Dg Chest 2 View  Result Date: 05/31/2018 CLINICAL DATA:  Fever, body aches, nausea, and vomiting for 4 days. Cough for 5 weeks. History of hypertension and sickle cell trait. Nonsmoker. EXAM: CHEST - 2 VIEW COMPARISON:  12/08/2016 FINDINGS: Shallow inspiration. Consolidation demonstrated in the right middle lobe and left lingula suggesting multifocal pneumonia. Heart size and pulmonary vascularity are normal. No blunting of costophrenic angles. No pneumothorax. Mediastinal contours appear intact. Heart size and pulmonary vascularity are normal. IMPRESSION: Consolidation in the right middle lobe and left lingula suggesting multifocal pneumonia. Electronically Signed   By: Burman Nieves M.D.   On: 05/31/2018 19:19    Procedures Procedures (including critical care time)  Medications Ordered in ED Medications  metoCLOPramide (REGLAN) tablet 10 mg (10 mg Oral Given 05/31/18 1855)  ipratropium-albuterol (DUONEB) 0.5-2.5 (3) MG/3ML nebulizer solution 3 mL (3 mLs Nebulization Given 05/31/18 1855)  acetaminophen  (TYLENOL) tablet 650 mg (650 mg Oral Given 05/31/18 1859)  azithromycin (ZITHROMAX) tablet 500 mg (500 mg Oral Given 05/31/18 2011)  amoxicillin-clavulanate (AUGMENTIN) 875-125 MG per tablet 1 tablet (1 tablet Oral Given 05/31/18 2011)  albuterol (PROVENTIL HFA;VENTOLIN HFA) 108 (90 Base) MCG/ACT inhaler 2 puff (2 puffs Inhalation Given 05/31/18 2011)     Initial Impression / Assessment and Plan / ED Course  I have reviewed the triage vital signs and the nursing notes.  Pertinent labs & imaging results that were available during my care of the patient were reviewed by me and considered in my medical decision making (see chart for details).    Patient presents today for evaluation of cough, fevers, and generally not feeling well.  She was previously seen at urgent care and diagnosed with probable influenza.  She reports since then she has continued to worsen with continued myalgias and worsening cough.  Chest x-ray was obtained showing concern for multifocal pneumonia.  She is not tachycardic or tachypneic and is afebrile here, not consistent with Sirs or sepsis.  She was treated symptomatically with DuoNeb after which her cough fully resolved and she felt much better.  She had been taking Zofran at home however was still occasionally vomiting.  She was given Reglan after which she reports that the nausea had fully resolved.  She is given an inhaler while in the department and shown how to use it.  Given the multifocal nature will treat with both of these azithromycin to cover for atypicals and Augmentin.  I offered patient additional evaluation including labs, EKG and discussed the potential benefits of these.  Patient refused and stated her understanding of these risks.    Return precautions were discussed with patient who states their understanding.  At the time of discharge patient denied  any unaddressed complaints or concerns.  Patient is agreeable for discharge home.   Final Clinical  Impressions(s) / ED Diagnoses   Final diagnoses:  Multifocal pneumonia  Non-intractable vomiting with nausea, unspecified vomiting type    ED Discharge Orders         Ordered    albuterol (PROVENTIL HFA;VENTOLIN HFA) 108 (90 Base) MCG/ACT inhaler  Every 4 hours PRN     05/31/18 2008    azithromycin (ZITHROMAX) 250 MG tablet     05/31/18 2008    amoxicillin-clavulanate (AUGMENTIN) 875-125 MG tablet  Every 12 hours     05/31/18 2008    metoCLOPramide (REGLAN) 10 MG tablet  Every 6 hours PRN     05/31/18 2008           Cristina GongHammond, Tondra Reierson W, Cordelia Poche-C 05/31/18 2334    Wynetta FinesMessick, Peter C, MD 06/02/18 1054

## 2018-05-31 NOTE — ED Triage Notes (Signed)
C/o nausea, vomiting, generalized body aches, and productive cough with yellow sputum since Saturday.  Seen at [facility] on Monday and states she was told she probably had the flu and was too late for Tamiflu.  Pt states she is feeling worse.  Taking cough medicine and Zofran.

## 2018-08-01 ENCOUNTER — Other Ambulatory Visit: Payer: Self-pay

## 2018-08-01 ENCOUNTER — Ambulatory Visit (HOSPITAL_COMMUNITY)
Admission: EM | Admit: 2018-08-01 | Discharge: 2018-08-01 | Disposition: A | Discharge status: Home / Self Care | Payer: Medicaid Other | Attending: Family Medicine | Admitting: Family Medicine

## 2018-08-01 ENCOUNTER — Encounter (HOSPITAL_COMMUNITY): Payer: Self-pay

## 2018-08-01 DIAGNOSIS — N760 Acute vaginitis: Secondary | ICD-10-CM

## 2018-08-01 MED ORDER — METRONIDAZOLE 500 MG PO TABS: 500.0000 mg | ORAL_TABLET | Freq: Two times a day (BID) | ORAL | 0 refills | Status: DC

## 2018-08-01 MED ORDER — FLUCONAZOLE 150 MG PO TABS: 150.0000 mg | ORAL_TABLET | Freq: Every day | ORAL | 0 refills | Status: DC

## 2018-08-01 NOTE — Discharge Instructions (Addendum)
Take the metronidazole 2 x a day for a week This is for BV Take the diflucan once now and repeat in a week This is for yeast  Return as needed

## 2018-08-01 NOTE — ED Provider Notes (Signed)
[facility]    CSN: [id number] Arrival date & time: 08/01/18  4818     History   Chief Complaint Chief Complaint  Patient presents with  . Urinary Tract Infection    HPI Diana Roman is a 33 y.o. female.   HPI  Patient has no urinary symptoms.  No frequency.  No dysuria.  No fever or chills.  No abdominal pain.  She has some vaginal itching and irritation.  Scant yellow discharge.  No odor.  She states that she is not sexually active, no concern for STD.  Past Medical History:  Diagnosis Date  . Abnormal Pap smear 2011  . Anxiety   . Bipolar 1 disorder (HCC)   . Depression    on meds, doing well  . H/O bacterial infection   . H/O varicella   . Headache(784.0)    Frequent  . Hypertension   . Infection    UTI  . Migraine   . No pertinent past medical history   . Sickle cell trait (HCC)   . Vaginal Pap smear, abnormal    repeat was normal  . Yeast infection     Patient Active Problem List   Diagnosis Date Noted  . Bipolar 1 disorder, depressed, severe (HCC) 03/16/2018  . Suicide attempt [facility])     Past Surgical History:  Procedure Laterality Date  . DENTAL SURGERY    . NO PAST SURGERIES      OB History    Gravida  1   Para  1   Term  1   Preterm  0   AB  0   Living  1     SAB  0   TAB  0   Ectopic  0   Multiple  0   Live Births  1            Home Medications    Prior to Admission medications   Medication Sig Start Date End Date Taking? Authorizing Provider  albuterol (PROVENTIL HFA;VENTOLIN HFA) 108 (90 Base) MCG/ACT inhaler Inhale 2 puffs into the lungs every 4 (four) hours as needed for wheezing or shortness of breath. 05/31/18   Cristina Gong, PA-C  famotidine (PEPCID) 20 MG tablet Take 1 tablet (20 mg total) by mouth 2 (two) times daily. 12/08/16   [name], PA-C  fluconazole (DIFLUCAN) 150 MG tablet Take 1 tablet (150 mg total) by mouth daily. Repeat in 1 week if needed 08/01/18   Eustace Moore, MD   hydrOXYzine (ATARAX/VISTARIL) 50 MG tablet Take 1 tablet (50 mg total) by mouth every 6 (six) hours as needed for anxiety. 03/21/18   Micheal Likens, MD  ibuprofen (ADVIL,MOTRIN) 800 MG tablet Take 1 tablet (800 mg total) by mouth 3 (three) times daily. 03/14/18   Wieters, Hallie C, PA-C  lamoTRIgine (LAMICTAL) 25 MG tablet Take 1 tablet (25 mg total) by mouth daily. 03/21/18   Micheal Likens, MD  lurasidone (LATUDA) 20 MG TABS tablet Take 1 tablet (20 mg total) by mouth daily with breakfast. 03/21/18   Micheal Likens, MD  metoCLOPramide (REGLAN) 10 MG tablet Take 1 tablet (10 mg total) by mouth every 6 (six) hours as needed for nausea or vomiting. 05/31/18   Cristina Gong, PA-C  metroNIDAZOLE (FLAGYL) 500 MG tablet Take 1 tablet (500 mg total) by mouth 2 (two) times daily. 08/01/18   Eustace Moore, MD  mirtazapine (REMERON) 7.5 MG tablet Take 1 tablet (7.5 mg total)  by mouth at bedtime. 03/21/18   Micheal Likens, MD  naproxen (NAPROSYN) 500 MG tablet Take 1 tablet (500 mg total) by mouth 2 (two) times daily. 12/08/16   [name], PA-C  ondansetron (ZOFRAN-ODT) 8 MG disintegrating tablet Take 1 tablet (8 mg total) by mouth every 8 (eight) hours as needed for nausea. 05/29/18   Elvina Sidle, MD  sertraline (ZOLOFT) 50 MG tablet Take 1 tablet (50 mg total) by mouth daily. 03/21/18   Micheal Likens, MD  Vitamin D, Ergocalciferol, (DRISDOL) 50000 units CAPS capsule Take 50,000 Units by mouth 2 (two) times a week. 12/03/16   [provider]    Family History Family History  Problem Relation Age of Onset  . Hypertension Mother   . Diabetes Maternal Grandmother   . Glaucoma Maternal Grandmother   . Hypertension Sister     Social History Social History   Tobacco Use  . Smoking status: Never Smoker  . Smokeless tobacco: Never Used  Substance Use Topics  . Alcohol use: Yes    Comment: 2/wk  . Drug use: No     Allergies    Patient has no known allergies.   Review of Systems Review of Systems  Constitutional: Negative for chills and fever.  HENT: Negative for ear pain and sore throat.   Eyes: Negative for pain and visual disturbance.  Respiratory: Negative for cough and shortness of breath.   Cardiovascular: Negative for chest pain and palpitations.  Gastrointestinal: Negative for abdominal pain and vomiting.  Genitourinary: Positive for vaginal discharge. Negative for dysuria and hematuria.  Musculoskeletal: Negative for arthralgias and back pain.  Skin: Negative for color change and rash.  Neurological: Negative for seizures and syncope.  All other systems reviewed and are negative.    Physical Exam Triage Vital Signs ED Triage Vitals  Enc Vitals Group     BP 08/01/18 0840 (!) 149/103     Pulse Rate 08/01/18 0840 73     Resp 08/01/18 0840 18     Temp 08/01/18 0840 98.1 F (36.7 C)     Temp Source 08/01/18 0840 Oral     SpO2 08/01/18 0840 98 %     Weight --      Height --      Head Circumference --      Peak Flow --      Pain Score 08/01/18 0842 0     Pain Loc --      Pain Edu? --      Excl. in GC? --    No data found.  Updated Vital Signs BP (!) 149/103 (BP Location: Left Arm)   Pulse 73   Temp 98.1 F (36.7 C) (Oral)   Resp 18   LMP 07/06/2018   SpO2 98%   Visual Acuity Right Eye Distance:   Left Eye Distance:   Bilateral Distance:    Right Eye Near:   Left Eye Near:    Bilateral Near:     Physical Exam Constitutional:      General: She is not in acute distress.    Appearance: She is well-developed.  HENT:     Head: Normocephalic and atraumatic.  Eyes:     Conjunctiva/sclera: Conjunctivae normal.     Pupils: Pupils are equal, round, and reactive to light.  Neck:     Musculoskeletal: Normal range of motion.  Cardiovascular:     Rate and Rhythm: Normal rate.  Pulmonary:     Effort: Pulmonary effort is normal. No  respiratory distress.  Abdominal:     General:  There is no distension.     Palpations: Abdomen is soft.  Genitourinary:    Comments: Exam deferred. Musculoskeletal: Normal range of motion.  Skin:    General: Skin is warm and dry.  Neurological:     Mental Status: She is alert.      UC Treatments / Results  Labs (all labs ordered are listed, but only abnormal results are displayed) Labs Reviewed - No data to display  EKG None  Radiology No results found.  Procedures Procedures (including critical care time)  Medications Ordered in UC Medications - No data to display  Initial Impression / Assessment and Plan / UC Course  I have reviewed the triage vital signs and the nursing notes.  Pertinent labs & imaging results that were available during my care of the patient were reviewed by me and considered in my medical decision making (see chart for details).      Final Clinical Impressions(s) / UC Diagnoses   Final diagnoses:  Vaginitis and vulvovaginitis     Discharge Instructions     Take the metronidazole 2 x a day for a week This is for BV Take the diflucan once now and repeat in a week This is for yeast  Return as needed    ED Prescriptions    Medication Sig Dispense Auth. Provider   metroNIDAZOLE (FLAGYL) 500 MG tablet Take 1 tablet (500 mg total) by mouth 2 (two) times daily. 14 tablet Eustace Moore, MD   fluconazole (DIFLUCAN) 150 MG tablet Take 1 tablet (150 mg total) by mouth daily. Repeat in 1 week if needed 2 tablet Eustace Moore, MD     Controlled Substance Prescriptions [place] Controlled Substance Registry consulted? Not Applicable   Eustace Moore, MD 08/01/18 1031

## 2018-08-01 NOTE — ED Triage Notes (Signed)
Pt presents with urinary tract symptoms; cloudy, dark yellow urine and film discharge with itching since Sunday.

## 2018-09-29 ENCOUNTER — Emergency Department (HOSPITAL_COMMUNITY): Payer: Medicaid Other

## 2018-09-29 ENCOUNTER — Emergency Department (HOSPITAL_COMMUNITY)
Admission: EM | Admit: 2018-09-29 | Discharge: 2018-09-29 | Disposition: A | Discharge status: Home / Self Care | Payer: Medicaid Other | Attending: Emergency Medicine | Admitting: Emergency Medicine

## 2018-09-29 ENCOUNTER — Encounter (HOSPITAL_COMMUNITY): Payer: Self-pay

## 2018-09-29 ENCOUNTER — Other Ambulatory Visit: Payer: Self-pay

## 2018-09-29 DIAGNOSIS — B379 Candidiasis, unspecified: Secondary | ICD-10-CM | POA: Insufficient documentation

## 2018-09-29 DIAGNOSIS — I10 Essential (primary) hypertension: Secondary | ICD-10-CM | POA: Diagnosis not present

## 2018-09-29 DIAGNOSIS — R0789 Other chest pain: Secondary | ICD-10-CM

## 2018-09-29 LAB — WET PREP, GENITAL
Clue Cells Wet Prep HPF POC: NONE SEEN
Sperm: NONE SEEN
Trich, Wet Prep: NONE SEEN

## 2018-09-29 LAB — CBC
HCT: 33.5 % — ABNORMAL LOW (ref 36.0–46.0)
Hemoglobin: 10.4 g/dL — ABNORMAL LOW (ref 12.0–15.0)
MCH: 27.4 pg (ref 26.0–34.0)
MCHC: 31 g/dL (ref 30.0–36.0)
MCV: 88.4 fL (ref 80.0–100.0)
Platelets: 228 10*3/uL (ref 150–400)
RBC: 3.79 MIL/uL — ABNORMAL LOW (ref 3.87–5.11)
RDW: 17.3 % — ABNORMAL HIGH (ref 11.5–15.5)
WBC: 7.7 10*3/uL (ref 4.0–10.5)
nRBC: 0 % (ref 0.0–0.2)

## 2018-09-29 LAB — URINALYSIS, ROUTINE W REFLEX MICROSCOPIC
Bilirubin Urine: NEGATIVE
Glucose, UA: NEGATIVE mg/dL
Hgb urine dipstick: NEGATIVE
Ketones, ur: NEGATIVE mg/dL
Nitrite: NEGATIVE
Protein, ur: NEGATIVE mg/dL
Specific Gravity, Urine: 1.031 — ABNORMAL HIGH (ref 1.005–1.030)
pH: 6 (ref 5.0–8.0)

## 2018-09-29 LAB — BASIC METABOLIC PANEL
Anion gap: 6 (ref 5–15)
BUN: 17 mg/dL (ref 6–20)
CO2: 23 mmol/L (ref 22–32)
Calcium: 8.6 mg/dL — ABNORMAL LOW (ref 8.9–10.3)
Chloride: 109 mmol/L (ref 98–111)
Creatinine, Ser: 0.95 mg/dL (ref 0.44–1.00)
GFR calc Af Amer: >60 mL/min (ref >60)
GFR calc non Af Amer: >60 mL/min (ref >60)
Glucose, Bld: 92 mg/dL (ref 70–99)
Potassium: 3.7 mmol/L (ref 3.5–5.1)
Sodium: 138 mmol/L (ref 135–145)

## 2018-09-29 LAB — I-STAT BETA HCG BLOOD, ED (NOT ORDERABLE): I-stat hCG, quantitative: <5 m[IU]/mL (ref <5)

## 2018-09-29 LAB — TROPONIN I: Troponin I: <0.03 ng/mL (ref <0.03)

## 2018-09-29 MED ORDER — IBUPROFEN 200 MG PO TABS
600.0000 mg | ORAL_TABLET | Freq: Once | ORAL | Status: AC
  Administered 2018-09-29: 600 mg via ORAL
  Filled 2018-09-29: qty 3

## 2018-09-29 MED ORDER — SODIUM CHLORIDE 0.9% FLUSH: 3.0000 mL | Freq: Once | INTRAVENOUS | Status: DC

## 2018-09-29 MED ORDER — FLUCONAZOLE 150 MG PO TABS: 150.0000 mg | ORAL_TABLET | Freq: Once | ORAL | 0 refills | Status: AC

## 2018-09-29 NOTE — ED Triage Notes (Signed)
Patient reports that she has mid chest pain  X 4 days. Patient states she tried aspirin every 6 hours with no relief and Ibupfen 800 mg every 6 hours, the last being 1500 today. Patient states when the Ibuprofen wears off she has tightness and pressure in the mid chest. Patient denies any SOB or diaphoresis.

## 2018-09-29 NOTE — Discharge Instructions (Signed)
Please take Ibuprofen (Advil, motrin) and Tylenol (acetaminophen) to relieve your pain.  You may take up to 600 MG (3 pills) of normal strength ibuprofen every 6 hours as needed.  In between doses of ibuprofen you make take tylenol, up to 1,000 mg (two extra strength pills).  Do not take more than 3,000 mg tylenol in a 24 hour period.  Please check all medication labels as many medications such as pain and cold medications may contain tylenol.  Do not drink alcohol while taking these medications.  Do not take other NSAID'S while taking ibuprofen (such as aleve or naproxen).  Please take ibuprofen with food to decrease stomach upset.

## 2018-09-29 NOTE — ED Provider Notes (Signed)
[facility]-EMERGENCY DEPT Provider Note   CSN: [id number] Arrival date & time: 09/29/18  1830    History   Chief Complaint Chief Complaint  Patient presents with  . Chest Pain    HPI Diana Roman is a 33 y.o. female who presents today for evaluation of central chest pain.  Her chest pain has been present for 4 days, it is in the middle of her chest and last night and today has intermittently radiated into her left anterior chest.  She reports that she recently started a new job with lifting and moving.  She has been taking 600 mg of ibuprofen every 6 hours with relief of her pain until the ibuprofen wears off.  She denies any shortness of breath, fevers, or diaphoresis.  She does not use any birth control or hormones.  She does not have a history of PE/DVT, hemoptysis, or leg swelling.  She also reports that she has been having vaginal irritation for the past 2 to 3 days.  She feels like she may have a yeast infection as she reports that she has thick white discharge and has had similar in the past with yeast infections.  She reports that she is not currently sexually active.  Denies any pelvic pains, dysuria increased frequency or urgency.       HPI  Past Medical History:  Diagnosis Date  . Abnormal Pap smear 2011  . Anxiety   . Bipolar 1 disorder (HCC)   . Depression    on meds, doing well  . H/O bacterial infection   . H/O varicella   . Headache(784.0)    Frequent  . Hypertension   . Infection    UTI  . Migraine   . No pertinent past medical history   . Sickle cell trait (HCC)   . Vaginal Pap smear, abnormal    repeat was normal  . Yeast infection     Patient Active Problem List   Diagnosis Date Noted  . Bipolar 1 disorder, depressed, severe (HCC) 03/16/2018  . Suicide attempt [facility])     Past Surgical History:  Procedure Laterality Date  . DENTAL SURGERY    . NO PAST SURGERIES       OB History    Gravida  1   Para  1   Term  1   Preterm  0   AB  0   Living  1     SAB  0   TAB  0   Ectopic  0   Multiple  0   Live Births  1            Home Medications    Prior to Admission medications   Medication Sig Start Date End Date Taking? Authorizing Provider  acetaminophen (TYLENOL) 500 MG tablet Take 1,000 mg by mouth daily as needed (chest pain).   Yes [provider]  aspirin EC 325 MG tablet Take 325 mg by mouth every 6 (six) hours as needed (chest pain).   Yes [provider]  ibuprofen (ADVIL) 200 MG tablet Take 800 mg by mouth 2 (two) times daily as needed (chest pain).   Yes [provider]  simethicone (MYLICON) 80 MG chewable tablet Chew 80 mg by mouth every 6 (six) hours as needed for flatulence (chest pain).   Yes [provider]  albuterol (PROVENTIL HFA;VENTOLIN HFA) 108 (90 Base) MCG/ACT inhaler Inhale 2 puffs into the lungs every 4 (four) hours as needed for wheezing  or shortness of breath. Patient not taking: Reported on 09/29/2018 05/31/18   Cristina Gong, PA-C  famotidine (PEPCID) 20 MG tablet Take 1 tablet (20 mg total) by mouth 2 (two) times daily. Patient not taking: Reported on 09/29/2018 12/08/16   [name], PA-C  hydrOXYzine (ATARAX/VISTARIL) 50 MG tablet Take 1 tablet (50 mg total) by mouth every 6 (six) hours as needed for anxiety. Patient not taking: Reported on 09/29/2018 03/21/18   Micheal Likens, MD  ibuprofen (ADVIL,MOTRIN) 800 MG tablet Take 1 tablet (800 mg total) by mouth 3 (three) times daily. Patient not taking: Reported on 09/29/2018 03/14/18   Wieters, Fran Lowes C, PA-C  lamoTRIgine (LAMICTAL) 25 MG tablet Take 1 tablet (25 mg total) by mouth daily. Patient not taking: Reported on 09/29/2018 03/21/18   Micheal Likens, MD  lurasidone (LATUDA) 20 MG TABS tablet Take 1 tablet (20 mg total) by mouth daily with breakfast. Patient not taking: Reported on 09/29/2018 03/21/18   Micheal Likens, MD  metoCLOPramide  (REGLAN) 10 MG tablet Take 1 tablet (10 mg total) by mouth every 6 (six) hours as needed for nausea or vomiting. Patient not taking: Reported on 09/29/2018 05/31/18   Cristina Gong, PA-C  metroNIDAZOLE (FLAGYL) 500 MG tablet Take 1 tablet (500 mg total) by mouth 2 (two) times daily. Patient not taking: Reported on 09/29/2018 08/01/18   Eustace Moore, MD  mirtazapine (REMERON) 7.5 MG tablet Take 1 tablet (7.5 mg total) by mouth at bedtime. Patient not taking: Reported on 09/29/2018 03/21/18   Micheal Likens, MD  naproxen (NAPROSYN) 500 MG tablet Take 1 tablet (500 mg total) by mouth 2 (two) times daily. Patient not taking: Reported on 09/29/2018 12/08/16   [name], PA-C  ondansetron (ZOFRAN-ODT) 8 MG disintegrating tablet Take 1 tablet (8 mg total) by mouth every 8 (eight) hours as needed for nausea. Patient not taking: Reported on 09/29/2018 05/29/18   Elvina Sidle, MD  sertraline (ZOLOFT) 50 MG tablet Take 1 tablet (50 mg total) by mouth daily. Patient not taking: Reported on 09/29/2018 03/21/18   Micheal Likens, MD    Family History Family History  Problem Relation Age of Onset  . Hypertension Mother   . Diabetes Maternal Grandmother   . Glaucoma Maternal Grandmother   . Hypertension Sister     Social History Social History   Tobacco Use  . Smoking status: Never Smoker  . Smokeless tobacco: Never Used  Substance Use Topics  . Alcohol use: Yes    Comment: 2/wk  . Drug use: No     Allergies   Patient has no known allergies.   Review of Systems Review of Systems  Constitutional: Negative for chills and fever.  HENT: Negative for congestion and sinus pain.   Respiratory: Negative for cough and shortness of breath.   Cardiovascular: Positive for chest pain. Negative for palpitations and leg swelling.  Gastrointestinal: Negative for abdominal pain, diarrhea, nausea and vomiting.  Genitourinary: Positive for vaginal discharge. Negative for  dysuria, flank pain and urgency.       Vulvar irritation  Musculoskeletal: Negative for back pain and neck pain.  Neurological: Negative for dizziness, weakness and headaches.  Psychiatric/Behavioral: Negative for confusion.  All other systems reviewed and are negative.    Physical Exam Updated Vital Signs BP (!) 147/102   Pulse 64   Temp 98.2 F (36.8 C) (Oral)   Resp 17   Ht  (1.676 m)   Wt 83.9 kg  LMP 09/14/2018   SpO2 100%   BMI 29.86 kg/m   Physical Exam Vitals signs and nursing note reviewed.  Constitutional:      General: She is not in acute distress.    Appearance: She is well-developed. She is not diaphoretic.  HENT:     Head: Normocephalic and atraumatic.  Eyes:     General: No scleral icterus.       Right eye: No discharge.        Left eye: No discharge.     Conjunctiva/sclera: Conjunctivae normal.  Neck:     Musculoskeletal: Normal range of motion.  Cardiovascular:     Rate and Rhythm: Normal rate and regular rhythm.     Pulses:          Dorsalis pedis pulses are 2+ on the right side and 2+ on the left side.       Posterior tibial pulses are 2+ on the right side and 2+ on the left side.     Heart sounds: Normal heart sounds.  Pulmonary:     Effort: Pulmonary effort is normal. No respiratory distress.     Breath sounds: Normal breath sounds. No stridor. No decreased breath sounds, wheezing or rhonchi.  Chest:     Chest wall: Tenderness (Palpation over bilateral sternocostal joints and left anterior chest both re-creates and exacerbates her reported chest pain exactly.) present. No mass or deformity.  Abdominal:     General: There is no distension.     Palpations: Abdomen is soft.  Genitourinary:    Comments: Patient refused exam. Musculoskeletal: Normal range of motion.        General: No deformity.     Right lower leg: She exhibits no tenderness.     Left lower leg: She exhibits no tenderness.  Skin:    General: Skin is warm and dry.   Neurological:     General: No focal deficit present.     Mental Status: She is alert.     Motor: No abnormal muscle tone.  Psychiatric:        Mood and Affect: Mood normal.        Behavior: Behavior normal.      ED Treatments / Results  Labs (all labs ordered are listed, but only abnormal results are displayed) Labs Reviewed  WET PREP, GENITAL - Abnormal; Notable for the following components:      Result Value   Yeast Wet Prep HPF POC PRESENT (*)    WBC, Wet Prep HPF POC MANY (*)    All other components within normal limits  BASIC METABOLIC PANEL - Abnormal; Notable for the following components:   Calcium 8.6 (*)    All other components within normal limits  CBC - Abnormal; Notable for the following components:   RBC 3.79 (*)    Hemoglobin 10.4 (*)    HCT 33.5 (*)    RDW 17.3 (*)    All other components within normal limits  URINALYSIS, ROUTINE W REFLEX MICROSCOPIC - Abnormal; Notable for the following components:   APPearance HAZY (*)    Specific Gravity, Urine 1.031 (*)    Leukocytes,Ua LARGE (*)    Bacteria, UA RARE (*)    All other components within normal limits  TROPONIN I  I-STAT BETA HCG BLOOD, ED ([facility], [facility], [facility] ONLY)  I-STAT BETA HCG BLOOD, ED (NOT ORDERABLE)  GC/CHLAMYDIA PROBE AMP ([place]) NOT AT [facility]    EKG EKG Interpretation  Date/Time:  Friday Sep 29 2018 18:56:02  EDT Ventricular Rate:  81 PR Interval:  182 QRS Duration: 72 QT Interval:  362 QTC Calculation: 420 R Axis:   77 Text Interpretation:  Normal sinus rhythm Normal ECG No significant change since last tracing Confirmed by Richardean CanalYao, David H ([id number]) on 09/29/2018 9:13:43 PM   Radiology Dg Chest 2 View  Result Date: 09/29/2018 CLINICAL DATA:  Chest pain EXAM: CHEST - 2 VIEW COMPARISON:  May 31, 2018. FINDINGS: The lungs are clear. Heart is upper normal in size with pulmonary vascularity within normal limits. No adenopathy. No pneumothorax. No bone lesions. IMPRESSION: No edema or  consolidation.  Heart upper normal in size. Electronically Signed   By: Bretta BangWilliam  Woodruff III M.D.   On: 09/29/2018 20:02    Procedures Procedures (including critical care time)  Medications Ordered in ED Medications  sodium chloride flush (NS) 0.9 % injection 3 mL (has no administration in time range)  ibuprofen (ADVIL) tablet 600 mg (600 mg Oral Given 09/29/18 2134)     Initial Impression / Assessment and Plan / ED Course  I have reviewed the triage vital signs and the nursing notes.  Pertinent labs & imaging results that were available during my care of the patient were reviewed by me and considered in my medical decision making (see chart for details).       Diana Roman presents today for evaluation of chest pain.  Her pain has been going on for multiple days and is intermittently relieved with ibuprofen.  Troponin is not elevated, EKG was obtained and reviewed without evidence of ischemia or pericarditis.  Chest x-ray without edema or consolidation, heart is upper limit of normal size.  Labs are obtained and reviewed without significant electrolyte or hematologic derangements.  Her chest pain is easily reproducible with palpation over her sternal costal joints bilaterally in her left anterior chest.  I suspect costochondritis.  She is PERC negative.  Costochondritis would be consistent with her relief of symptoms with ibuprofen.  Her blood pressure was slightly elevated, recommended PCP follow-up, DASH diet.  She also stated that she felt like she had a yeast infection.  We discussed evaluation options, including pelvic exam, self swab, or outpatient evaluation.  She elected for self swab and declined pelvic exam.  Wet prep shows many yeast and white blood cells consistent with a yeast infection.  She will be treated with a one-time dose of Diflucan.    Do not suspect ACS or PE.  Return precautions were discussed with patient who states their understanding.  At the time of discharge  patient denied any unaddressed complaints or concerns.  Patient is agreeable for discharge home.   Final Clinical Impressions(s) / ED Diagnoses   Final diagnoses:  Atypical chest pain  Yeast infection    ED Discharge Orders         Ordered    fluconazole (DIFLUCAN) 150 MG tablet   Once     09/29/18 2238           Cristina GongHammond, Diallo Ponder W, Cordelia Poche-C 09/30/18 0132    Charlynne PanderYao, David Hsienta, MD 10/04/18 [phone]

## 2018-10-02 LAB — GC/CHLAMYDIA PROBE AMP (~~LOC~~) NOT AT ARMC
Chlamydia: NEGATIVE
Neisseria Gonorrhea: NEGATIVE

## 2018-10-25 ENCOUNTER — Other Ambulatory Visit: Payer: Self-pay

## 2018-10-25 ENCOUNTER — Encounter: Payer: Self-pay | Admitting: Family Medicine

## 2018-10-25 ENCOUNTER — Ambulatory Visit (INDEPENDENT_AMBULATORY_CARE_PROVIDER_SITE_OTHER): Payer: Medicaid Other | Admitting: Family Medicine

## 2018-10-25 DIAGNOSIS — F314 Bipolar disorder, current episode depressed, severe, without psychotic features: Secondary | ICD-10-CM | POA: Diagnosis not present

## 2018-10-25 DIAGNOSIS — I10 Essential (primary) hypertension: Secondary | ICD-10-CM | POA: Insufficient documentation

## 2018-10-25 MED ORDER — AMLODIPINE BESYLATE 2.5 MG PO TABS: 2.5000 mg | ORAL_TABLET | Freq: Every day | ORAL | 2 refills | Status: DC

## 2018-10-25 MED ORDER — LURASIDONE HCL 20 MG PO TABS: 20.0000 mg | ORAL_TABLET | Freq: Every day | ORAL | 2 refills | Status: DC

## 2018-10-25 MED ORDER — HYDROXYZINE HCL 50 MG PO TABS: 50.0000 mg | ORAL_TABLET | Freq: Three times a day (TID) | ORAL | 2 refills | Status: DC | PRN

## 2018-10-25 NOTE — Progress Notes (Addendum)
Virtual Visit via Video Note  I connected with Diana Roman on 10/25/2018 at 2pm  by a video enabled telemedicine application and verified that I am speaking with the correct person using two identifiers.  Location patient: home Location provider:work office Persons participating in the virtual visit: patient, provider  I discussed the limitations of evaluation and management by telemedicine and the availability of in person appointments. The patient expressed understanding and agreed to proceed.    DOB: November 25, 1985 Encounter date: 10/25/2018  This is a 33 y.o. female who presents to establish care. Chief Complaint  Patient presents with  . Establish Care    History of present illness: Has followed with Dr. Toy Care in past for bipolar/depression; following [facility]. Needs someone in network. Still talking with Al[facility] with [facility] but has been off meds since December. Was on latuda  Has a little headache today. Has been out of work since Jan 2018. Hasn't had insurance, but started job at [employer] 2nd. Has been using urgent care, ED. Had pneumonia in feb. Chest pains on and off to point of being unbearable last month. BP has been extremely high. Has been trying to exercise. Has been trying to not take medication but still running in 140's-150's. Waking up with bad headaches. Never been on medication for high blood pressure. Past year have all been elevated; was good before that. Was high after delivery of daughter. Mom and sister are both on medication for bp and with taking other meds for depression/bipolar so just didn't want to be on more. HR was weird and quite low at health at work. Works nights permanently now. Not taking in high sodium foods; has really changed everything.   Husband left her around October, and really brought her down. Stopped eating, stopped meds, very depressed.   Has not had problem with anemia in past although was slightly low at last ER visit.    Periods started when she was 10. Last 7 days. Always heavy. Has always been this way. Was on implanon for a couple of years after having daughter. Did well with this. Still had period but it wasn't heavy. Would be interested in implanon again.   Past Medical History:  Diagnosis Date  . Abnormal Pap smear 2011  . Anxiety   . Bipolar 1 disorder ([place])   . Depression    on meds, doing well  . H/O bacterial infection   . H/O varicella   . Headache(784.0)    Frequent  . Hypertension   . Infection    UTI  . Migraine   . No pertinent past medical history   . Sickle cell trait ([place])   . Vaginal Pap smear, abnormal    repeat was normal  . Yeast infection    Past Surgical History:  Procedure Laterality Date  . DENTAL SURGERY    . NO PAST SURGERIES     No Known Allergies Current Meds  Medication Sig  . hydrOXYzine (ATARAX/VISTARIL) 50 MG tablet Take 1 tablet (50 mg total) by mouth 3 (three) times daily as needed for anxiety.  Marland Kitchen lurasidone (LATUDA) 20 MG TABS tablet Take 1 tablet (20 mg total) by mouth daily with breakfast.  . ondansetron (ZOFRAN-ODT) 8 MG disintegrating tablet Take 1 tablet (8 mg total) by mouth every 8 (eight) hours as needed for nausea.  . [DISCONTINUED] hydrOXYzine (ATARAX/VISTARIL) 50 MG tablet Take 1 tablet (50 mg total) by mouth every 6 (six) hours as needed for anxiety.  . [  DISCONTINUED] ibuprofen (ADVIL) 200 MG tablet Take 800 mg by mouth 2 (two) times daily as needed (chest pain).  . [DISCONTINUED] lamoTRIgine (LAMICTAL) 25 MG tablet Take 1 tablet (25 mg total) by mouth daily.  . [DISCONTINUED] lurasidone (LATUDA) 20 MG TABS tablet Take 1 tablet (20 mg total) by mouth daily with breakfast.  . [DISCONTINUED] metoCLOPramide (REGLAN) 10 MG tablet Take 1 tablet (10 mg total) by mouth every 6 (six) hours as needed for nausea or vomiting.  . [DISCONTINUED] mirtazapine (REMERON) 7.5 MG tablet Take 1 tablet (7.5 mg total) by mouth at bedtime.  . [DISCONTINUED]  simethicone (MYLICON) 80 MG chewable tablet Chew 80 mg by mouth every 6 (six) hours as needed for flatulence (chest pain).   Social History   Tobacco Use  . Smoking status: Never Smoker  . Smokeless tobacco: Never Used  Substance Use Topics  . Alcohol use: Yes    Comment: 2/wk   Family History  Problem Relation Age of Onset  . Hypertension Mother   . Diabetes Maternal Grandmother   . Glaucoma Maternal Grandmother   . Hypertension Sister      Review of Systems  Constitutional: Negative for chills, fatigue and fever.  Respiratory: Negative for cough, chest tightness, shortness of breath and wheezing.   Cardiovascular: Negative for chest pain, palpitations and leg swelling.  Neurological: Positive for headaches (has had these for past year. ibuprofen 800mg  helps. Takes this about BID).    Objective:  There were no vitals taken for this visit.      BP Readings from Last 3 Encounters:  09/29/18 (!) 147/102  08/01/18 (!) 149/103  05/31/18 (!) 137/97   Wt Readings from Last 3 Encounters:  09/29/18 185 lb (83.9 kg)  05/31/18 185 lb (83.9 kg)  03/16/18 233 lb 11 oz (106 kg)    Physical Exam Constitutional:      General: She is not in acute distress.    Appearance: She is well-developed.  Cardiovascular:     Rate and Rhythm: Normal rate and regular rhythm.     Heart sounds: Normal heart sounds. No murmur. No friction rub.  Pulmonary:     Effort: Pulmonary effort is normal. No respiratory distress.     Breath sounds: Normal breath sounds. No wheezing or rales.  Musculoskeletal:     Right lower leg: No edema.     Left lower leg: No edema.  Neurological:     Mental Status: She is alert and oriented to person, place, and time.  Psychiatric:        Behavior: Behavior normal.     Comments: States that mood control has been difficult off of medications.  She feels she has a lot of fluctuations of mood and swings quickly.  When she is under stress, gets very anxious, tearful  easily.  No thoughts of hurting herself.  Mood does make daily functioning difficult.      Assessment/Plan: 1. Bipolar 1 disorder, depressed, severe (HCC) Previously on multiple medications to help with management.  She did feel some medication burden with taking all of these.  We will restart just the Latuda, which she previously tolerated very well and was taking for a long period of time.  Referral to psychiatry for future management.  I am okay with filling prescription until she is able to get in. - Ambulatory referral to Psychiatry - lurasidone (LATUDA) 20 MG TABS tablet; Take 1 tablet (20 mg total) by mouth daily with breakfast.  Dispense: 30 tablet; Refill: 2 -  hydrOXYzine (ATARAX/VISTARIL) 50 MG tablet; Take 1 tablet (50 mg total) by mouth 3 (three) times daily as needed for anxiety.  Dispense: 30 tablet; Refill: 2  2. Essential hypertension Blood pressure has not improved in spite of healthier eating, decreased sodium intake, and weight loss.  After consideration for medications, suggested amlodipine to start. Discussed new medication(s) today with patient. Discussed potential side effects and patient verbalized understanding.  Monitor on daily basis at home.  Let me know if any trouble with medication.  We will plan for an office visit in 1 month to recheck.  - amLODipine (NORVASC) 2.5 MG tablet; Take 1 tablet (2.5 mg total) by mouth daily.  Dispense: 30 tablet; Refill: 2   Return in about 1 month (around 11/24/2018) for Chronic condition visit.  We will plan for blood work at that time.  Diana ShoveJunell Koberlein, MD   I discussed the assessment and treatment plan with the patient. The patient was provided an opportunity to ask questions and all were answered. The patient agreed with the plan and demonstrated an understanding of the instructions.   The patient was advised to call back or seek an in-person evaluation if the symptoms worsen or if the condition fails to improve as  anticipated.  I provided 35 minutes of non-face-to-face time during this encounter.

## 2018-10-26 ENCOUNTER — Telehealth: Payer: Self-pay | Admitting: *Deleted

## 2018-10-26 NOTE — Telephone Encounter (Signed)
I left a message for the pt to return my call. 

## 2018-10-26 NOTE — Telephone Encounter (Signed)
Message routed to Dr Banks 

## 2018-10-26 NOTE — Telephone Encounter (Signed)
-----   Message from Caren Macadam, MD sent at 10/25/2018  5:40 PM EDT ----- She is interested in restarting implanon. I do not do these, but we could ask Dr. Volanda Napoleon or could send to obgyn if Dr. Volanda Napoleon unable to do this.

## 2018-10-26 NOTE — Telephone Encounter (Signed)
-----   Message from Caren Macadam, MD sent at 10/25/2018  2:46 PM EDT ----- Please schedule in office followup in 1 months time

## 2018-10-27 ENCOUNTER — Telehealth: Payer: Self-pay | Admitting: *Deleted

## 2018-10-27 NOTE — Telephone Encounter (Signed)
I called the pt and scheduled an appt for 7/15 at 3pm.  PHQ-9 added to visit also.

## 2018-10-27 NOTE — Telephone Encounter (Signed)
-----   Message from Junell C Koberlein, MD sent at 10/25/2018  2:46 PM EDT ----- Please schedule in office followup in 1 months time  

## 2018-10-31 NOTE — Telephone Encounter (Signed)
I am able to place the nexplanon.  Pt would need to be seen and complete paper work for the implant to be ordered.  If pt is in a rush to have it done, it may be quicker going through ob/gyn as they sometimes keep them stocked in the office.

## 2018-11-01 NOTE — Telephone Encounter (Signed)
I left a message for the pt to return my call. 

## 2018-11-29 ENCOUNTER — Other Ambulatory Visit: Payer: Self-pay

## 2018-11-29 ENCOUNTER — Encounter: Payer: Self-pay | Admitting: Family Medicine

## 2018-11-29 ENCOUNTER — Ambulatory Visit (INDEPENDENT_AMBULATORY_CARE_PROVIDER_SITE_OTHER): Payer: Medicaid Other | Admitting: Family Medicine

## 2018-11-29 VITALS — BP 118/88 | HR 69 | Temp 97.2°F | Ht 66.0 in | Wt 225.1 lb

## 2018-11-29 DIAGNOSIS — N920 Excessive and frequent menstruation with regular cycle: Secondary | ICD-10-CM

## 2018-11-29 DIAGNOSIS — I10 Essential (primary) hypertension: Secondary | ICD-10-CM | POA: Diagnosis not present

## 2018-11-29 DIAGNOSIS — R5383 Other fatigue: Secondary | ICD-10-CM | POA: Diagnosis not present

## 2018-11-29 DIAGNOSIS — F314 Bipolar disorder, current episode depressed, severe, without psychotic features: Secondary | ICD-10-CM

## 2018-11-29 DIAGNOSIS — H0520 Unspecified exophthalmos: Secondary | ICD-10-CM | POA: Diagnosis not present

## 2018-11-29 DIAGNOSIS — E785 Hyperlipidemia, unspecified: Secondary | ICD-10-CM

## 2018-11-29 DIAGNOSIS — E559 Vitamin D deficiency, unspecified: Secondary | ICD-10-CM | POA: Diagnosis not present

## 2018-11-29 DIAGNOSIS — Z309 Encounter for contraceptive management, unspecified: Secondary | ICD-10-CM

## 2018-11-29 MED ORDER — NORETHIN ACE-ETH ESTRAD-FE 1-20 MG-MCG PO TABS: 1.0000 | ORAL_TABLET | Freq: Every day | ORAL | 6 refills | Status: DC

## 2018-11-29 NOTE — Progress Notes (Signed)
Diana Roman DOB: 08-01-1985 Encounter date: 11/29/2018  This is a 33 y.o. female who presents with Chief Complaint  Patient presents with  . Follow-up  . Contraception    requests to discuss options    History of present illness: Feeling good. Very happy with blood pressure numbers today. Last week checked bp and it was 147/90's. Last night was 128/70.   Tolerating amlodipine ok. No problems with medication. No swelling in legs.   Wants to discuss contraception options. 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 Trying to be extra safe. In high school she had depo - gained a lot of weight. Then started on pill, which she kept forgetting to take. Then had implant which was fine. Removed this when she was married.   Periods are monthly. Last for 7 days. First 5 days are horrible. Cramps, bleeding. Changing every hour. Always been that way for her.    No Known Allergies Current Meds  Medication Sig  . amLODipine (NORVASC) 2.5 MG tablet Take 1 tablet (2.5 mg total) by mouth daily.  . hydrOXYzine (ATARAX/VISTARIL) 50 MG tablet Take 1 tablet (50 mg total) by mouth 3 (three) times daily as needed for anxiety.  Marland Kitchen lurasidone (LATUDA) 20 MG TABS tablet Take 1 tablet (20 mg total) by mouth daily with breakfast.  . ondansetron (ZOFRAN-ODT) 8 MG disintegrating tablet Take 1 tablet (8 mg total) by mouth every 8 (eight) hours as needed for nausea.    Review of Systems  Constitutional: Negative for chills, fatigue and fever.  Respiratory: Negative for cough, chest tightness, shortness of breath and wheezing.   Cardiovascular: Negative for chest pain, palpitations and leg swelling.    Objective:  BP 118/88 (BP Location: Left Arm, Patient Position: Sitting, Cuff Size: Large)   Pulse 69   Temp (!) 97.2 F (36.2 C) (Temporal)   Ht 5\' 6"  (1.676 m)   Wt 225 lb 1.6 oz (102.1 kg)   LMP 11/06/2018 (Exact Date)   SpO2 98%   BMI 36.33 kg/m   Weight: 225 lb 1.6 oz (102.1 kg)   BP Readings from Last  3 Encounters:  11/29/18 118/88  09/29/18 (!) 147/102  08/01/18 (!) 149/103   Wt Readings from Last 3 Encounters:  11/29/18 225 lb 1.6 oz (102.1 kg)  09/29/18 185 lb (83.9 kg)  05/31/18 185 lb (83.9 kg)    Physical Exam Constitutional:      General: She is not in acute distress.    Appearance: She is well-developed.  Eyes:     Comments: Mild exophthalmus   Cardiovascular:     Rate and Rhythm: Normal rate and regular rhythm.     Heart sounds: Normal heart sounds. No murmur. No friction rub.  Pulmonary:     Effort: Pulmonary effort is normal. No respiratory distress.     Breath sounds: Normal breath sounds. No wheezing or rales.  Musculoskeletal:     Right lower leg: No edema.     Left lower leg: No edema.  Neurological:     Mental Status: She is alert and oriented to person, place, and time.  Psychiatric:        Behavior: Behavior normal.     Assessment/Plan  1. Bipolar 1 disorder, depressed, severe (Minor) Numbers given today so that she can psychiatry appointment.  Okay to refill medications until that point.  Mood is stable currently.  2. Essential hypertension Significantly improved control with 2.5 mg of amlodipine daily.  She is tolerating this medication well.  Continue to  check blood pressures at home.  3. Exophthalmus - TSH; Future - TSH  4. Vitamin D deficiency - VITAMIN D 25 Hydroxy (Vit-D Deficiency, Fractures); Future - VITAMIN D 25 Hydroxy (Vit-D Deficiency, Fractures)  5. Other fatigue Fatigue has improved since starting Latuda. - CBC with Differential/Platelet; Future - Comprehensive metabolic panel; Future - Comprehensive metabolic panel - CBC with Differential/Platelet  6. Hyperlipidemia, unspecified hyperlipidemia type - Comprehensive metabolic panel; Future - Lipid panel; Future - Lipid panel - Comprehensive metabolic panel  7. Encounter for contraceptive management, unspecified type/menorrhagia Periods have always been heavy.  After  discussing different contraceptive options, she has decided to try an OCP.  She has taken this in the past and done well.  We discussed that ideally this will help shorten and lighten her menstrual cycle. - norethindrone-ethinyl estradiol (LOESTRIN FE) 1-20 MG-MCG tablet; Take 1 tablet by mouth daily.  Dispense: 1 Package; Refill: 6 - HCG, Qualitative    Return pending bloodwork; can return for complete physical.     Theodis ShoveJunell Larah Kuntzman, MD

## 2018-11-30 LAB — COMPREHENSIVE METABOLIC PANEL
ALT: 19 U/L (ref 0–35)
AST: 16 U/L (ref 0–37)
Albumin: 4.1 g/dL (ref 3.5–5.2)
Alkaline Phosphatase: 45 U/L (ref 39–117)
BUN: 18 mg/dL (ref 6–23)
CO2: 25 mEq/L (ref 19–32)
Calcium: 9 mg/dL (ref 8.4–10.5)
Chloride: 107 mEq/L (ref 96–112)
Creatinine, Ser: 0.86 mg/dL (ref 0.40–1.20)
GFR: 92.38 mL/min (ref >60.00)
Glucose, Bld: 86 mg/dL (ref 70–99)
Potassium: 4.3 mEq/L (ref 3.5–5.1)
Sodium: 138 mEq/L (ref 135–145)
Total Bilirubin: 0.5 mg/dL (ref 0.2–1.2)
Total Protein: 6.9 g/dL (ref 6.0–8.3)

## 2018-11-30 LAB — CBC WITH DIFFERENTIAL/PLATELET
Basophils Absolute: 0.1 10*3/uL (ref 0.0–0.1)
Basophils Relative: 0.9 % (ref 0.0–3.0)
Eosinophils Absolute: 0.1 10*3/uL (ref 0.0–0.7)
Eosinophils Relative: 1.1 % (ref 0.0–5.0)
HCT: 34 % — ABNORMAL LOW (ref 36.0–46.0)
Hemoglobin: 10.7 g/dL — ABNORMAL LOW (ref 12.0–15.0)
Lymphocytes Relative: 34.9 % (ref 12.0–46.0)
Lymphs Abs: 2 10*3/uL (ref 0.7–4.0)
MCHC: 31.4 g/dL (ref 30.0–36.0)
MCV: 84.8 fl (ref 78.0–100.0)
Monocytes Absolute: 0.7 10*3/uL (ref 0.1–1.0)
Monocytes Relative: 12.8 % — ABNORMAL HIGH (ref 3.0–12.0)
Neutro Abs: 2.9 10*3/uL (ref 1.4–7.7)
Neutrophils Relative %: 50.3 % (ref 43.0–77.0)
Platelets: 221 10*3/uL (ref 150.0–400.0)
RBC: 4.01 Mil/uL (ref 3.87–5.11)
RDW: 17.9 % — ABNORMAL HIGH (ref 11.5–15.5)
WBC: 5.7 10*3/uL (ref 4.0–10.5)

## 2018-11-30 LAB — LIPID PANEL
Cholesterol: 173 mg/dL (ref 0–200)
HDL: 48.7 mg/dL (ref >39.00)
LDL Cholesterol: 111 mg/dL — ABNORMAL HIGH (ref 0–99)
NonHDL: 124.04
Total CHOL/HDL Ratio: 4
Triglycerides: 63 mg/dL (ref 0.0–149.0)
VLDL: 12.6 mg/dL (ref 0.0–40.0)

## 2018-11-30 LAB — VITAMIN D 25 HYDROXY (VIT D DEFICIENCY, FRACTURES): VITD: 9.85 ng/mL — ABNORMAL LOW (ref 30.00–100.00)

## 2018-11-30 LAB — TSH: TSH: 2.08 u[IU]/mL (ref 0.35–4.50)

## 2018-11-30 LAB — HCG, SERUM, QUALITATIVE: Preg, Serum: NEGATIVE

## 2018-12-02 ENCOUNTER — Other Ambulatory Visit: Payer: Self-pay | Admitting: Family Medicine

## 2018-12-02 DIAGNOSIS — E559 Vitamin D deficiency, unspecified: Secondary | ICD-10-CM

## 2018-12-02 MED ORDER — VITAMIN D (ERGOCALCIFEROL) 1.25 MG (50000 UNIT) PO CAPS: 50000.0000 [IU] | ORAL_CAPSULE | ORAL | 0 refills | Status: DC

## 2018-12-05 ENCOUNTER — Telehealth: Payer: Self-pay | Admitting: Family Medicine

## 2018-12-05 NOTE — Telephone Encounter (Signed)
Copied from [place] [id number]. Topic: General - Other >> Dec 05, 2018 12:50 PM Keene Breath wrote: Reason for CRM: Patient called to speak with the nurse regarding a possible yeast infection.  Patient asked if nurse or doctor could call in a script.  CB# [phone]

## 2018-12-06 MED ORDER — FLUCONAZOLE 150 MG PO TABS: ORAL_TABLET | ORAL | 0 refills | Status: DC

## 2018-12-06 NOTE — Telephone Encounter (Signed)
Please get description of symptoms. If concerns for std then should have appointment. If no std concerns, then would be ok with treatment for yeast if typical sx - itching, thicker discharge. If wants oral medication ok with diflucan 150mg  disp 2; 1 now and repeat in 72 hours if sx present. If prefers topical treatment instead let me know.

## 2018-12-06 NOTE — Telephone Encounter (Signed)
I spoke with the pt and she complains of light yellow-white patchy discharge with no odor that is the same symptoms from a previous yeast infection and requested not to have a medication like Monistat to insert vaginally.  Denies any concern for STDs as she has not had intercourse due to awaiting birth control.  Patient is aware the Rx for Diflucan was sent to the pharmacy and will call back for an appt if needed.

## 2018-12-12 ENCOUNTER — Other Ambulatory Visit: Payer: Self-pay

## 2018-12-12 ENCOUNTER — Ambulatory Visit (HOSPITAL_COMMUNITY)
Admission: EM | Admit: 2018-12-12 | Discharge: 2018-12-12 | Disposition: A | Discharge status: Home / Self Care | Payer: Medicaid Other | Attending: Family Medicine | Admitting: Family Medicine

## 2018-12-12 ENCOUNTER — Encounter (HOSPITAL_COMMUNITY): Payer: Self-pay

## 2018-12-12 DIAGNOSIS — G43909 Migraine, unspecified, not intractable, without status migrainosus: Secondary | ICD-10-CM

## 2018-12-12 DIAGNOSIS — I10 Essential (primary) hypertension: Secondary | ICD-10-CM | POA: Diagnosis not present

## 2018-12-12 LAB — POCT PREGNANCY, URINE: Preg Test, Ur: NEGATIVE

## 2018-12-12 LAB — POCT URINALYSIS DIP (DEVICE)
Bilirubin Urine: NEGATIVE
Glucose, UA: NEGATIVE mg/dL
Hgb urine dipstick: NEGATIVE
Ketones, ur: NEGATIVE mg/dL
Leukocytes,Ua: NEGATIVE
Nitrite: NEGATIVE
Protein, ur: NEGATIVE mg/dL
Specific Gravity, Urine: >=1.03 (ref 1.005–1.030)
Urobilinogen, UA: 0.2 mg/dL (ref 0.0–1.0)
pH: 6 (ref 5.0–8.0)

## 2018-12-12 MED ORDER — KETOROLAC TROMETHAMINE 60 MG/2ML IM SOLN
INTRAMUSCULAR | Status: AC
  Filled 2018-12-12: qty 2

## 2018-12-12 MED ORDER — DEXAMETHASONE SODIUM PHOSPHATE 10 MG/ML IJ SOLN
10.0000 mg | Freq: Once | INTRAMUSCULAR | Status: AC
  Administered 2018-12-12: 10 mg via INTRAMUSCULAR

## 2018-12-12 MED ORDER — KETOROLAC TROMETHAMINE 60 MG/2ML IM SOLN
60.0000 mg | Freq: Once | INTRAMUSCULAR | Status: AC
  Administered 2018-12-12: 60 mg via INTRAMUSCULAR

## 2018-12-12 MED ORDER — DEXAMETHASONE SODIUM PHOSPHATE 10 MG/ML IJ SOLN
INTRAMUSCULAR | Status: AC
  Filled 2018-12-12: qty 1

## 2018-12-12 NOTE — Discharge Instructions (Addendum)
We treated you for a migraine today in clinic. Try using Excedrin Migraine for her headaches instead of Tylenol Get some rest, hydrate  Sending a self swab for testing for BV, yeast and STDs. Your urine did not show any infection or pregnancy Follow-up with your OB/GYN for further problems.

## 2018-12-12 NOTE — ED Provider Notes (Signed)
[facility]    CSN: [id number] Arrival date & time: 12/12/18  0802     History   Chief Complaint Chief Complaint  Patient presents with  . Migraine    HPI Diana Roman is a 33 y.o. female.   Patient is a 33 year old female with past medical history of hypertension, anxiety, bipolar, BV, yeast.  She presents today with approximately 2 days of migraine.  Reports the headache is located of the left eye, sharp and constant.  Somewhat relieved with Tylenol but the headache comes back with a vengeance.  She has had some associated nausea, photophobia.  She has been taking Tylenol and Zofran without much relief.  Recently started on amlodipine for her blood pressure and birth control.  Also recently started on vitamin D.  She is currently on her menstrual cycle.  She is reporting lower pelvic discomfort that is different from her menstrual cramps.  She just started her menstrual cycle today.  Denies any obvious odor, discharge, itching, irritation in the vaginal area.  Denies any dysuria, hematuria or urinary frequency.  Denies any flank pain or fevers.  She is currently sexually active, protected with one partner.  Recently treated for yeast infection with Diflucan about a week ago.  ROS per HPI      Past Medical History:  Diagnosis Date  . Abnormal Pap smear 2011  . Anxiety   . Bipolar 1 disorder ([place])   . Depression    on meds, doing well  . H/O bacterial infection   . H/O varicella   . Headache(784.0)    Frequent  . Hypertension   . Infection    UTI  . Migraine   . No pertinent past medical history   . Sickle cell trait ([place])   . Vaginal Pap smear, abnormal    repeat was normal  . Yeast infection     Patient Active Problem List   Diagnosis Date Noted  . Hypertension 10/25/2018  . Bipolar 1 disorder, depressed, severe ([facility]) 03/16/2018  . Suicide attempt [facility])     Past Surgical History:  Procedure Laterality Date  . DENTAL SURGERY    . NO PAST  SURGERIES      OB History    Gravida  1   Para  1   Term  1   Preterm  0   AB  0   Living  1     SAB  0   TAB  0   Ectopic  0   Multiple  0   Live Births  1            Home Medications    Prior to Admission medications   Medication Sig Start Date End Date Taking? Authorizing Provider  amLODipine (NORVASC) 2.5 MG tablet Take 1 tablet (2.5 mg total) by mouth daily. 10/25/18   Caren Macadam, MD  fluconazole (DIFLUCAN) 150 MG tablet Take 1 tablet by mouth at once, may repeat in 72 hours if needed 12/06/18   Koberlein, Steele Berg, MD  hydrOXYzine (ATARAX/VISTARIL) 50 MG tablet Take 1 tablet (50 mg total) by mouth 3 (three) times daily as needed for anxiety. 10/25/18   Caren Macadam, MD  lurasidone (LATUDA) 20 MG TABS tablet Take 1 tablet (20 mg total) by mouth daily with breakfast. 10/25/18   Koberlein, Steele Berg, MD  norethindrone-ethinyl estradiol (LOESTRIN FE) 1-20 MG-MCG tablet Take 1 tablet by mouth daily. 11/29/18   Caren Macadam, MD  ondansetron (ZOFRAN-ODT) 8  MG disintegrating tablet Take 1 tablet (8 mg total) by mouth every 8 (eight) hours as needed for nausea. 05/29/18   Elvina SidleLauenstein, Kurt, MD  Vitamin D, Ergocalciferol, (DRISDOL) 1.25 MG (50000 UT) CAPS capsule Take 1 capsule (50,000 Units total) by mouth every 7 (seven) days. 12/02/18   Wynn BankerKoberlein, Junell C, MD    Family History Family History  Problem Relation Age of Onset  . Hypertension Mother   . Diabetes Maternal Grandmother   . Glaucoma Maternal Grandmother   . Hypertension Sister     Social History Social History   Tobacco Use  . Smoking status: Never Smoker  . Smokeless tobacco: Never Used  Substance Use Topics  . Alcohol use: Yes    Comment: 2/wk  . Drug use: No     Allergies   Patient has no known allergies.   Review of Systems Review of Systems   Physical Exam Triage Vital Signs ED Triage Vitals  Enc Vitals Group     BP 12/12/18 0821 (!) 160/101     Pulse Rate  12/12/18 0821 (!) 57     Resp 12/12/18 0821 16     Temp 12/12/18 0821 97.7 F (36.5 C)     Temp Source 12/12/18 0821 Temporal     SpO2 12/12/18 0821 100 %     Weight 12/12/18 0826 192 lb (87.1 kg)     Height --      Head Circumference --      Peak Flow --      Pain Score 12/12/18 0825 8     Pain Loc --      Pain Edu? --      Excl. in GC? --    No data found.  Updated Vital Signs BP (!) 160/101 (BP Location: Left Arm)   Pulse (!) 57   Temp 97.7 F (36.5 C) (Temporal)   Resp 16   Wt 192 lb (87.1 kg)   LMP 12/12/2018   SpO2 100%   BMI 30.99 kg/m   Visual Acuity Right Eye Distance:   Left Eye Distance:   Bilateral Distance:    Right Eye Near:   Left Eye Near:    Bilateral Near:     Physical Exam Vitals signs and nursing note reviewed.  Constitutional:      General: She is not in acute distress.    Appearance: Normal appearance. She is not ill-appearing, toxic-appearing or diaphoretic.  HENT:     Head: Normocephalic.     Nose: Nose normal.     Mouth/Throat:     Pharynx: Oropharynx is clear.  Eyes:     Extraocular Movements: Extraocular movements intact.     Conjunctiva/sclera: Conjunctivae normal.     Pupils: Pupils are equal, round, and reactive to light.  Neck:     Musculoskeletal: Normal range of motion.  Pulmonary:     Effort: Pulmonary effort is normal.  Abdominal:     Palpations: Abdomen is soft.     Tenderness: There is no abdominal tenderness.  Musculoskeletal: Normal range of motion.  Skin:    General: Skin is warm and dry.     Findings: No rash.  Neurological:     General: No focal deficit present.     Mental Status: She is alert.  Psychiatric:        Mood and Affect: Mood normal.      UC Treatments / Results  Labs (all labs ordered are listed, but only abnormal results are displayed) Labs Reviewed  POC  URINE PREG, ED  POCT URINALYSIS DIP (DEVICE)  POCT PREGNANCY, URINE  CERVICOVAGINAL ANCILLARY ONLY    EKG   Radiology No  results found.  Procedures Procedures (including critical care time)  Medications Ordered in UC Medications  ketorolac (TORADOL) injection 60 mg (60 mg Intramuscular Given 12/12/18 0902)  dexamethasone (DECADRON) injection 10 mg (10 mg Intramuscular Given 12/12/18 0901)  dexamethasone (DECADRON) 10 MG/ML injection (has no administration in time range)  ketorolac (TORADOL) 60 MG/2ML injection (has no administration in time range)    Initial Impression / Assessment and Plan / UC Course  I have reviewed the triage vital signs and the nursing notes.  Pertinent labs & imaging results that were available during my care of the patient were reviewed by me and considered in my medical decision making (see chart for details).    33 year old female recently started on amlodipine for high blood pressure and oral contraceptives. Headache started shortly after new medications. No concerning signs or symptoms on exam today.  Vital signs stable Hypertensive but she is in pain.  Treating for migraine with dexamethasone and Toradol injection. Recommended further use of Excedrin Migraine for migraines Rest, stay hydrated.  Pelvic pain-most likely related to either menstrual cramping or underlying infection.  Sending self swab for testing for BV, yeast and STDs. Recently treated for yeast infection. Urine was negative for infection and pregnancy Instructed to follow-up with OB/GYN for further problems For severe worsening symptoms you need to go the ER. Final Clinical Impressions(s) / UC Diagnoses   Final diagnoses:  Migraine without status migrainosus, not intractable, unspecified migraine type     Discharge Instructions     We treated you for a migraine today in clinic. Try using Excedrin Migraine for her headaches instead of Tylenol Get some rest, hydrate  Sending a self swab for testing for BV, yeast and STDs. Your urine did not show any infection or pregnancy Follow-up with your OB/GYN  for further problems.     ED Prescriptions    None     Controlled Substance Prescriptions [place] Controlled Substance Registry consulted? Not Applicable   Janace ArisBast, Modine Oppenheimer A, NP 12/12/18 [phone]

## 2018-12-12 NOTE — ED Triage Notes (Signed)
Pt states she has a migraine. Pt states its been going on 2 days. Pt states she has pelvis pain as well.

## 2018-12-14 LAB — CERVICOVAGINAL ANCILLARY ONLY
Bacterial vaginitis: POSITIVE — AB
Candida vaginitis: NEGATIVE
Chlamydia: NEGATIVE
Neisseria Gonorrhea: NEGATIVE
Trichomonas: NEGATIVE

## 2018-12-15 ENCOUNTER — Telehealth (HOSPITAL_COMMUNITY): Payer: Self-pay | Admitting: Emergency Medicine

## 2018-12-15 MED ORDER — METRONIDAZOLE 500 MG PO TABS: 500.0000 mg | ORAL_TABLET | Freq: Two times a day (BID) | ORAL | 0 refills | Status: AC

## 2018-12-15 NOTE — Telephone Encounter (Signed)
Bacterial vaginosis is positive. This was not treated at the urgent care visit.  Flagyl 500 mg BID x 7 days #14 no refills sent to patients pharmacy of choice.    Attempted to reach patient. No answer at this time. Voicemail left.    

## 2018-12-15 NOTE — Telephone Encounter (Signed)
Pt called and was given results; pt verbalized understanding and to pick up meds

## 2019-02-15 ENCOUNTER — Other Ambulatory Visit: Payer: Self-pay | Admitting: Family Medicine

## 2019-02-15 ENCOUNTER — Other Ambulatory Visit: Payer: Medicaid Other

## 2019-02-15 DIAGNOSIS — D509 Iron deficiency anemia, unspecified: Secondary | ICD-10-CM

## 2019-02-16 ENCOUNTER — Other Ambulatory Visit: Payer: Self-pay | Admitting: Family Medicine

## 2019-02-16 DIAGNOSIS — I10 Essential (primary) hypertension: Secondary | ICD-10-CM

## 2019-02-16 DIAGNOSIS — F314 Bipolar disorder, current episode depressed, severe, without psychotic features: Secondary | ICD-10-CM

## 2019-02-23 ENCOUNTER — Other Ambulatory Visit: Payer: Self-pay

## 2019-02-23 ENCOUNTER — Ambulatory Visit (HOSPITAL_COMMUNITY)
Admission: EM | Admit: 2019-02-23 | Discharge: 2019-02-23 | Disposition: A | Discharge status: Home / Self Care | Payer: Medicaid Other | Attending: Emergency Medicine | Admitting: Emergency Medicine

## 2019-02-23 ENCOUNTER — Encounter (HOSPITAL_COMMUNITY): Payer: Self-pay

## 2019-02-23 DIAGNOSIS — R112 Nausea with vomiting, unspecified: Secondary | ICD-10-CM | POA: Diagnosis not present

## 2019-02-23 DIAGNOSIS — F1010 Alcohol abuse, uncomplicated: Secondary | ICD-10-CM

## 2019-02-23 DIAGNOSIS — R197 Diarrhea, unspecified: Secondary | ICD-10-CM | POA: Diagnosis not present

## 2019-02-23 MED ORDER — ONDANSETRON 4 MG PO TBDP
4.0000 mg | ORAL_TABLET | Freq: Once | ORAL | Status: AC
  Administered 2019-02-23: 18:00:00 4 mg via ORAL

## 2019-02-23 MED ORDER — ONDANSETRON 4 MG PO TBDP
ORAL_TABLET | ORAL | Status: AC
  Filled 2019-02-23: qty 1

## 2019-02-23 MED ORDER — ONDANSETRON 8 MG PO TBDP: ORAL_TABLET | ORAL | 0 refills | Status: DC

## 2019-02-23 NOTE — Discharge Instructions (Addendum)
Try to not drink that much again.  You can take Zofran up to 3 times a day.  It will constipate you so it will slow down the diarrhea.  Push electrolyte containing fluids such as Pedialyte or Gatorade as much as you can.  You may take ibuprofen 600 mg as needed for abdominal pain. if your abdominal pain gets worse or if it has not resolved in 24 hours, go immediately to emergency department for evaluation of your gallbladder.

## 2019-02-23 NOTE — ED Provider Notes (Signed)
HPI  SUBJECTIVE:  Diana Roman is a 33 y.o. female who presents with multiple episodes of nonbilious, nonbloody emesis, watery, nonbloody diarrhea after drinking "too much whiskey" last night.  She cannot remember how much she drank.  She states that she cannot tolerate p.o. at all.  She also reports squeezing, seconds long, intermittent, right upper quadrant pain described as "tightness".  No change in urine output.  She states that the abdominal pain is worse before vomiting, better afterwards.  No raw or undercooked foods, questionable leftovers, sick contacts, travel, recent camping.  No antipyretic in the past 4 to 6 hours.  She has tried bland foods, Gatorade, ginger ale but states that she throws this up.  There are no other aggravating or alleviating factors.  She denies history of pancreatitis, alcohol abuse.  she states she has glass of wine 2 or 3 times a week.  No history of gallbladder disease, diabetes, abdominal surgeries.  She has a past medical history of hypertension.  LMP: 9/23.  Denies the possibility of being pregnant.  TKZ:SWFUXNATF, Paris Lore, MD  Past Medical History:  Diagnosis Date  . Abnormal Pap smear 2011  . Anxiety   . Bipolar 1 disorder ([facility])   . Depression    on meds, doing well  . H/O bacterial infection   . H/O varicella   . Headache(784.0)    Frequent  . Hypertension   . Infection    UTI  . Migraine   . No pertinent past medical history   . Sickle cell trait ([facility])   . Vaginal Pap smear, abnormal    repeat was normal  . Yeast infection     Past Surgical History:  Procedure Laterality Date  . DENTAL SURGERY    . NO PAST SURGERIES      Family History  Problem Relation Age of Onset  . Hypertension Mother   . Diabetes Maternal Grandmother   . Glaucoma Maternal Grandmother   . Hypertension Sister     Social History   Tobacco Use  . Smoking status: Never Smoker  . Smokeless tobacco: Never Used  Substance Use Topics  . Alcohol use: Yes     Comment: 2/wk  . Drug use: No    No current facility-administered medications for this encounter.   Current Outpatient Medications:  .  amLODipine (NORVASC) 2.5 MG tablet, TAKE 1 TABLET BY MOUTH EVERY DAY, Disp: 30 tablet, Rfl: 2 .  hydrOXYzine (ATARAX/VISTARIL) 50 MG tablet, Take 1 tablet (50 mg total) by mouth 3 (three) times daily as needed for anxiety., Disp: 30 tablet, Rfl: 2 .  LATUDA 20 MG TABS tablet, TAKE 1 TABLET BY MOUTH DAILY WITH BREAKFAST, Disp: 30 tablet, Rfl: 2 .  norethindrone-ethinyl estradiol (LOESTRIN FE) 1-20 MG-MCG tablet, Take 1 tablet by mouth daily., Disp: 1 Package, Rfl: 6 .  ondansetron (ZOFRAN ODT) 8 MG disintegrating tablet, 1/2- 1 tablet q 8 hr prn nausea, vomiting, Disp: 20 tablet, Rfl: 0 .  Vitamin D, Ergocalciferol, (DRISDOL) 1.25 MG (50000 UT) CAPS capsule, Take 1 capsule (50,000 Units total) by mouth every 7 (seven) days., Disp: 12 capsule, Rfl: 0  No Known Allergies   ROS  As noted in HPI.   Physical Exam  BP (!) 154/93 (BP Location: Left Arm)   Pulse 82   Temp 98.8 F (37.1 C) (Oral)   Resp 16   LMP 02/07/2019   SpO2 100%   Constitutional: Well developed, well nourished, no acute distress Eyes:  EOMI, conjunctiva normal  bilaterally HENT: Normocephalic, atraumatic,mucus membranes moist Respiratory: Normal inspiratory effort Cardiovascular: Normal rate.  Cap refill less than 2 seconds. GI: Soft, mild right upper quadrant tenderness with deep palpation, negative Murphy.  Hypoactive bowel sounds.  Nondistended.  No guarding, rebound.  No periumbilical tenderness. Back: No CVAT skin: No rash, skin intact Musculoskeletal: no deformities Neurologic: Alert & oriented x 3, no focal neuro deficits Psychiatric: Speech and behavior appropriate   ED Course   Medications  ondansetron (ZOFRAN-ODT) disintegrating tablet 4 mg (4 mg Oral Given 02/23/19 1734)  ondansetron (ZOFRAN-ODT) 4 MG disintegrating tablet (has no administration in time range)     No orders of the defined types were placed in this encounter.   No results found for this or any previous visit (from the past 24 hour(s)). No results found.  ED Clinical Impression  1. Nausea vomiting and diarrhea   2. Excessive drinking alcohol      ED Assessment/Plan  Patient does have some mild right upper quadrant tenderness, but do not think that she has cholecystitis at this point in time.  She has not taken any antipyretic in the past 4 to 6 hours, she has no peritoneal signs.  No periumbilical tenderness suggestive of pancreatitis.  Suspect alcohol poisoning.  it has been 16 hours since her last drink.  No signs of alcohol withdrawal.  Denies history of alcohol abuse.    Patient was given 8 mg of Zofran and was tolerating p.o. prior to discharge.  We discussed sending her to the ER for labs, IV fluids, but she has opted to try oral rehydration at home.  If she is not feeling better in 24 hours, or if she gets worse in the meantime in any way, she will go to the ER  Discussed MDM, treatment plan, and plan for follow-up with patient. Discussed sn/sx that should prompt return to the ED. patient agrees with plan.   Meds ordered this encounter  Medications  . ondansetron (ZOFRAN-ODT) disintegrating tablet 4 mg  . ondansetron (ZOFRAN ODT) 8 MG disintegrating tablet    Sig: 1/2- 1 tablet q 8 hr prn nausea, vomiting    Dispense:  20 tablet    Refill:  0    *This clinic note was created using [employer]. Therefore, there may be occasional mistakes despite careful proofreading.   ?    Melynda Ripple, MD 02/24/19 [phone]

## 2019-02-23 NOTE — ED Triage Notes (Signed)
Pt presents with nausea, vomiting, and diarrhea after excessive drinking last night.

## 2019-03-07 ENCOUNTER — Other Ambulatory Visit: Payer: Self-pay

## 2019-03-07 ENCOUNTER — Ambulatory Visit: Payer: Medicaid Other | Admitting: Family Medicine

## 2019-03-07 NOTE — Progress Notes (Deleted)
Virtual Visit via Video Note  I connected with Diana Roman  on 03/07/19 at  9:30 AM EDT by a video enabled telemedicine application and verified that I am speaking with the correct person using two identifiers.  Location patient: home Location provider:work or home office Persons participating in the virtual visit: patient, provider  I discussed the limitations of evaluation and management by telemedicine and the availability of in person appointments. The patient expressed understanding and agreed to proceed.   Diana Roman DOB: 05/31/1985 Encounter date: 03/07/2019  This is a 33 y.o. female who presents with No chief complaint on file.   History of present illness: In ER 02/23/19 for alcohol poisoning with emesis, diarrhea.  HPI   No Known Allergies No outpatient medications have been marked as taking for the 03/07/19 encounter (Appointment) with Caren Macadam, MD.    Review of Systems  Objective:  LMP 02/07/2019       BP Readings from Last 3 Encounters:  02/23/19 (!) 154/93  12/12/18 (!) 160/101  11/29/18 118/88   Wt Readings from Last 3 Encounters:  12/12/18 192 lb (87.1 kg)  11/29/18 225 lb 1.6 oz (102.1 kg)  09/29/18 185 lb (83.9 kg)    EXAM:  GENERAL: alert, oriented, appears well and in no acute distress  HEENT: atraumatic, conjunctiva clear, no obvious abnormalities on inspection of external nose and ears  NECK: normal movements of the head and neck  LUNGS: on inspection no signs of respiratory distress, breathing rate appears normal, no obvious gross SOB, gasping or wheezing  CV: no obvious cyanosis  MS: moves all visible extremities without noticeable abnormality  PSYCH/NEURO: pleasant and cooperative, no obvious depression or anxiety, speech and thought processing grossly intact ***  Assessment/Plan  There are no diagnoses linked to this encounter.     I discussed the assessment and treatment plan with the patient. The patient was  provided an opportunity to ask questions and all were answered. The patient agreed with the plan and demonstrated an understanding of the instructions.   The patient was advised to call back or seek an in-person evaluation if the symptoms worsen or if the condition fails to improve as anticipated.  I provided *** minutes of non-face-to-face time during this encounter.   Micheline Rough, MD

## 2019-03-16 ENCOUNTER — Encounter: Payer: Self-pay | Admitting: Family Medicine

## 2019-03-16 ENCOUNTER — Telehealth (INDEPENDENT_AMBULATORY_CARE_PROVIDER_SITE_OTHER): Payer: Medicaid Other | Admitting: Family Medicine

## 2019-03-16 ENCOUNTER — Telehealth: Payer: Self-pay | Admitting: *Deleted

## 2019-03-16 ENCOUNTER — Other Ambulatory Visit: Payer: Self-pay

## 2019-03-16 VITALS — BP 138/80

## 2019-03-16 DIAGNOSIS — D649 Anemia, unspecified: Secondary | ICD-10-CM | POA: Diagnosis not present

## 2019-03-16 DIAGNOSIS — G43809 Other migraine, not intractable, without status migrainosus: Secondary | ICD-10-CM | POA: Diagnosis not present

## 2019-03-16 DIAGNOSIS — R252 Cramp and spasm: Secondary | ICD-10-CM | POA: Diagnosis not present

## 2019-03-16 DIAGNOSIS — I10 Essential (primary) hypertension: Secondary | ICD-10-CM

## 2019-03-16 DIAGNOSIS — E559 Vitamin D deficiency, unspecified: Secondary | ICD-10-CM

## 2019-03-16 MED ORDER — SUMATRIPTAN SUCCINATE 100 MG PO TABS: ORAL_TABLET | ORAL | 2 refills | Status: DC

## 2019-03-16 NOTE — Telephone Encounter (Signed)
Called the pt and scheduled a lab appt for 11/3 at [facility].

## 2019-03-16 NOTE — Progress Notes (Signed)
Virtual Visit via Video Note  I connected with Diana Roman  on 03/16/19 at 11:30 AM EDT by a video enabled telemedicine application and verified that I am speaking with the correct person using two identifiers.  Location patient: home Location provider:work or home office Persons participating in the virtual visit: patient, provider  I discussed the limitations of evaluation and management by telemedicine and the availability of in person appointments. The patient expressed understanding and agreed to proceed.  Video connection not working so we completed visit by phone  Diana Roman DOB: 1985-06-30 Encounter date: 03/16/2019  This is a 33 y.o. female who presents with Chief Complaint  Patient presents with  . Headache    History of present illness: Has been having really bad headaches. Still taking all of same medications including the latuda (up to 50mg  daily). Still taking vitamin just once a week. Still taking ocp, still taking just one tablet of bp medication.   Headaches are getting really bad. Getting charlie horses in legs that are bad. She isn't sure if something like potassium is low. Has had headaches in past but usually would be better with sleep. Usually when coming on gets light headed, a little blurry vision, then migraine comes on. Has tried taking ibuprofen 600mg  and has to lay down in dark. Migraine lasting about 4-6 hours. If she falls asleep it is better. Light sensitivity, + nauseated. Does usually vomit. Was given zofran last time she went to ED so she has been using this. Getting them now at least a few days/week. Has been this way for past month. When she wakes in morning just feels like headache, but then progresses during day. Throbbing on left side. Consistently on that left side/lower part of head.   Previous ED visit was drinking with friends thurs night from 10-1am and then next day was so sick, throwing up, sweating, just couldn't stop throwing up.   Last  appointment she missed was due to vomiting for a few days straight.   In August had period for 14 days. Not sure if birth control. Wasn't taking at same time every day when she started it so feels like this was related.  In sept had for normal 7 days.  This month had it for 9 days.   Blood pressure has been "good". Was high in ER because she didn't feel well. Has been in 138/80's. Lowest she has seen was 119/78; highest was when she was in ER.     No Known Allergies No outpatient medications have been marked as taking for the 03/16/19 encounter (Telemedicine) with Caren Macadam, MD.    Review of Systems  Constitutional: Positive for fatigue. Negative for chills and fever.  Respiratory: Negative for cough, chest tightness, shortness of breath and wheezing.   Cardiovascular: Negative for chest pain, palpitations and leg swelling.  Neurological: Positive for headaches.  Psychiatric/Behavioral: Negative for sleep disturbance.       Feels like mood is stable. Does struggle with bipolar symptoms; seeing psychiatry.    Objective:  BP 138/80       BP Readings from Last 3 Encounters:  03/16/19 138/80  02/23/19 (!) 154/93  12/12/18 (!) 160/101   Wt Readings from Last 3 Encounters:  12/12/18 192 lb (87.1 kg)  11/29/18 225 lb 1.6 oz (102.1 kg)  09/29/18 185 lb (83.9 kg)    EXAM:  GENERAL: sounds alert, oriented, and in no acute distress  PSYCH/NEURO: pleasant and cooperative, no obvious depression or  anxiety, speech and thought processing grossly intact   Assessment/Plan  1. Anemia, unspecified type - IBC + Ferritin; Future - Vitamin B12; Future - Folate; Future  2. Essential hypertension Blood pressure has been better controlled. - Comprehensive metabolic panel; Future - CBC with Differential/Platelet; Future  3. Leg cramps - Magnesium, RBC  4. Other migraine without status migrainosus, not intractable We are going to try Imitrex for onset of migraine.  I  encouraged her to look a migraine diet online to keep track of potential dietary triggers for migraines.  I discouraged her from taking over-the-counter medications multiple times a week since this could contribute to medication withdrawal/rebound headaches.  Discouraged alcohol as this can also trigger headaches.  She is not drinking regularly in spite of being in the ER recently for alcohol poisoning. - SUMAtriptan (IMITREX) 100 MG tablet; Take at first sign of migraine. OK to repeat dose in 2 hours if headache persists. Not to take more than 2 tablets in 24 hours.  Dispense: 10 tablet; Refill: 2 - Sedimentation rate; Future  5. Vitamin D deficiency  - VITAMIN D 25 Hydroxy (Vit-D Deficiency, Fractures); Future    I discussed the assessment and treatment plan with the patient. The patient was provided an opportunity to ask questions and all were answered. The patient agreed with the plan and demonstrated an understanding of the instructions.   The patient was advised to call back or seek an in-person evaluation if the symptoms worsen or if the condition fails to improve as anticipated.  I provided 35 minutes of non-face-to-face time during this encounter.   Theodis Shove, MD

## 2019-03-16 NOTE — Telephone Encounter (Signed)
-----   Message from Caren Macadam, MD sent at 03/16/2019 12:30 PM EDT ----- Please set up lab visit for her. She is leaving town later today so whenever convenient for her is ok.

## 2019-03-20 ENCOUNTER — Other Ambulatory Visit: Payer: Medicaid Other

## 2019-06-11 ENCOUNTER — Encounter (HOSPITAL_COMMUNITY): Payer: Self-pay

## 2019-06-11 ENCOUNTER — Other Ambulatory Visit: Payer: Self-pay

## 2019-06-11 ENCOUNTER — Ambulatory Visit (HOSPITAL_COMMUNITY)
Admission: EM | Admit: 2019-06-11 | Discharge: 2019-06-11 | Disposition: A | Discharge status: Home / Self Care | Payer: Medicaid Other | Attending: Family Medicine | Admitting: Family Medicine

## 2019-06-11 DIAGNOSIS — N898 Other specified noninflammatory disorders of vagina: Secondary | ICD-10-CM | POA: Diagnosis present

## 2019-06-11 LAB — POCT URINALYSIS DIP (DEVICE)
Bilirubin Urine: NEGATIVE
Glucose, UA: NEGATIVE mg/dL
Hgb urine dipstick: NEGATIVE
Ketones, ur: NEGATIVE mg/dL
Leukocytes,Ua: NEGATIVE
Nitrite: NEGATIVE
Protein, ur: NEGATIVE mg/dL
Specific Gravity, Urine: >=1.03 (ref 1.005–1.030)
Urobilinogen, UA: 0.2 mg/dL (ref 0.0–1.0)
pH: 5.5 (ref 5.0–8.0)

## 2019-06-11 NOTE — ED Triage Notes (Signed)
Pt presents to UC with white vaginal discharged and urinary frequency x 3 days.

## 2019-06-11 NOTE — ED Notes (Signed)
Vital sign reported to Provider Bast.

## 2019-06-11 NOTE — ED Provider Notes (Signed)
[facility]    CSN: [id number] Arrival date & time: 06/11/19  0803      History   Chief Complaint Chief Complaint  Patient presents with  . Urinary Frequency  . Vaginal Discharge    HPI GWENDOLYNE Roman is a 34 y.o. female.   Pt is a 34 year old female that presents with vaginal discharge and urinary frequency x 3 days. Symptoms have been constant. No itching, irritation, dysuria, hematuria. No abd pain, back pain, fevers. She has not taken anything for the symptoms. No concern for STD. Patient's last menstrual period was 05/23/2019 (exact date).  ROS per HPI      Past Medical History:  Diagnosis Date  . Abnormal Pap smear 2011  . Anxiety   . Bipolar 1 disorder ([place])   . Depression    on meds, doing well  . H/O bacterial infection   . H/O varicella   . Headache(784.0)    Frequent  . Hypertension   . Infection    UTI  . Migraine   . No pertinent past medical history   . Sickle cell trait ([place])   . Vaginal Pap smear, abnormal    repeat was normal  . Yeast infection     Patient Active Problem List   Diagnosis Date Noted  . Hypertension 10/25/2018  . Bipolar 1 disorder, depressed, severe ([place]) 03/16/2018  . Suicide attempt [facility])     Past Surgical History:  Procedure Laterality Date  . DENTAL SURGERY    . NO PAST SURGERIES      OB History    Gravida  1   Para  1   Term  1   Preterm  0   AB  0   Living  1     SAB  0   TAB  0   Ectopic  0   Multiple  0   Live Births  1            Home Medications    Prior to Admission medications   Medication Sig Start Date End Date Taking? Authorizing Provider  amLODipine (NORVASC) 2.5 MG tablet TAKE 1 TABLET BY MOUTH EVERY DAY 02/16/19   Koberlein, Steele Berg, MD  hydrOXYzine (ATARAX/VISTARIL) 50 MG tablet Take 1 tablet (50 mg total) by mouth 3 (three) times daily as needed for anxiety. 10/25/18   Koberlein, Steele Berg, MD  LATUDA 20 MG TABS tablet TAKE 1 TABLET BY MOUTH DAILY WITH  BREAKFAST 02/16/19   Koberlein, Junell C, MD  norethindrone-ethinyl estradiol (LOESTRIN FE) 1-20 MG-MCG tablet Take 1 tablet by mouth daily. 11/29/18   Caren Macadam, MD  ondansetron (ZOFRAN ODT) 8 MG disintegrating tablet 1/2- 1 tablet q 8 hr prn nausea, vomiting 02/23/19   Melynda Ripple, MD  SUMAtriptan (IMITREX) 100 MG tablet Take at first sign of migraine. OK to repeat dose in 2 hours if headache persists. Not to take more than 2 tablets in 24 hours. 03/16/19   Caren Macadam, MD  Vitamin D, Ergocalciferol, (DRISDOL) 1.25 MG (50000 UT) CAPS capsule Take 1 capsule (50,000 Units total) by mouth every 7 (seven) days. 12/02/18   Caren Macadam, MD    Family History Family History  Problem Relation Age of Onset  . Hypertension Mother   . Diabetes Maternal Grandmother   . Glaucoma Maternal Grandmother   . Hypertension Sister     Social History Social History   Tobacco Use  . Smoking status: Never Smoker  .  Smokeless tobacco: Never Used  Substance Use Topics  . Alcohol use: Yes    Comment: 2/wk  . Drug use: No     Allergies   Patient has no known allergies.   Review of Systems Review of Systems   Physical Exam Triage Vital Signs ED Triage Vitals  Enc Vitals Group     BP 06/11/19 0825 (!) 158/106     Pulse Rate 06/11/19 0825 72     Resp 06/11/19 0825 16     Temp 06/11/19 0825 98.9 F (37.2 C)     Temp Source 06/11/19 0825 Oral     SpO2 06/11/19 0825 99 %     Weight --      Height --      Head Circumference --      Peak Flow --      Pain Score 06/11/19 0822 0     Pain Loc --      Pain Edu? --      Excl. in GC? --    No data found.  Updated Vital Signs BP (!) 158/106 (BP Location: Right Arm)   Pulse 72   Temp 98.9 F (37.2 C) (Oral)   Resp 16   LMP 05/23/2019 (Exact Date)   SpO2 99%   Visual Acuity Right Eye Distance:   Left Eye Distance:   Bilateral Distance:    Right Eye Near:   Left Eye Near:    Bilateral Near:     Physical  Exam Vitals and nursing note reviewed.  Constitutional:      General: She is not in acute distress.    Appearance: Normal appearance. She is not ill-appearing, toxic-appearing or diaphoretic.  HENT:     Head: Normocephalic.     Nose: Nose normal.     Mouth/Throat:     Pharynx: Oropharynx is clear.  Eyes:     Conjunctiva/sclera: Conjunctivae normal.  Pulmonary:     Effort: Pulmonary effort is normal.  Musculoskeletal:        General: Normal range of motion.     Cervical back: Normal range of motion.  Skin:    General: Skin is warm and dry.     Findings: No rash.  Neurological:     Mental Status: She is alert.  Psychiatric:        Mood and Affect: Mood normal.      UC Treatments / Results  Labs (all labs ordered are listed, but only abnormal results are displayed) Labs Reviewed  POCT URINALYSIS DIP (DEVICE)  CERVICOVAGINAL ANCILLARY ONLY    EKG   Radiology No results found.  Procedures Procedures (including critical care time)  Medications Ordered in UC Medications - No data to display  Initial Impression / Assessment and Plan / UC Course  I have reviewed the triage vital signs and the nursing notes.  Pertinent labs & imaging results that were available during my care of the patient were reviewed by me and considered in my medical decision making (see chart for details).     Vaginal discharge- swab pending.  Final Clinical Impressions(s) / UC Diagnoses   Final diagnoses:  Vaginal discharge     Discharge Instructions     We will call you if any positive results.     ED Prescriptions    None     PDMP not reviewed this encounter.   Janace Aris, NP 06/11/19 [phone]

## 2019-06-11 NOTE — Discharge Instructions (Addendum)
We will call you if any positive results.

## 2019-06-14 ENCOUNTER — Telehealth (HOSPITAL_COMMUNITY): Payer: Self-pay | Admitting: Emergency Medicine

## 2019-06-14 LAB — CERVICOVAGINAL ANCILLARY ONLY
Bacterial vaginitis: POSITIVE — AB
Candida vaginitis: POSITIVE — AB
Chlamydia: NEGATIVE
Neisseria Gonorrhea: NEGATIVE
Trichomonas: NEGATIVE

## 2019-06-14 MED ORDER — METRONIDAZOLE 500 MG PO TABS: 500.0000 mg | ORAL_TABLET | Freq: Two times a day (BID) | ORAL | 0 refills | Status: AC

## 2019-06-14 MED ORDER — FLUCONAZOLE 150 MG PO TABS: 150.0000 mg | ORAL_TABLET | Freq: Every day | ORAL | 0 refills | Status: DC

## 2019-06-14 NOTE — Telephone Encounter (Signed)
Pt called requesting treatment for BV and yeast  That she sees results in mychart; meds sent to pharmacy and pt verbalized understanding

## 2019-06-17 ENCOUNTER — Other Ambulatory Visit: Payer: Self-pay | Admitting: Family Medicine

## 2019-06-17 DIAGNOSIS — Z309 Encounter for contraceptive management, unspecified: Secondary | ICD-10-CM

## 2019-07-31 ENCOUNTER — Telehealth (INDEPENDENT_AMBULATORY_CARE_PROVIDER_SITE_OTHER): Payer: Medicaid Other | Admitting: Family Medicine

## 2019-07-31 ENCOUNTER — Encounter: Payer: Self-pay | Admitting: Family Medicine

## 2019-07-31 DIAGNOSIS — R519 Headache, unspecified: Secondary | ICD-10-CM

## 2019-07-31 DIAGNOSIS — I10 Essential (primary) hypertension: Secondary | ICD-10-CM | POA: Diagnosis not present

## 2019-07-31 DIAGNOSIS — G43809 Other migraine, not intractable, without status migrainosus: Secondary | ICD-10-CM

## 2019-07-31 DIAGNOSIS — G4726 Circadian rhythm sleep disorder, shift work type: Secondary | ICD-10-CM

## 2019-07-31 DIAGNOSIS — R110 Nausea: Secondary | ICD-10-CM

## 2019-07-31 MED ORDER — ONDANSETRON 4 MG PO TBDP: ORAL_TABLET | ORAL | 0 refills | Status: DC

## 2019-07-31 MED ORDER — AMLODIPINE BESYLATE 5 MG PO TABS: 5.0000 mg | ORAL_TABLET | Freq: Every day | ORAL | 0 refills | Status: DC

## 2019-07-31 NOTE — Progress Notes (Signed)
Virtual Visit via Video Note  I connected with Diana Roman  on 07/31/19 at  3:40 PM EDT by a video enabled telemedicine application and verified that I am speaking with the correct person using two identifiers.  Location patient: home, [place] Location provider:work or home office Persons participating in the virtual visit: patient, provider  I discussed the limitations of evaluation and management by telemedicine and the availability of in person appointments. The patient expressed understanding and agreed to proceed.   HPI:  Acute visit for "a Migraine" -started 3 days ago -symptoms include throbbing L sided headache, nausea, light and sound sensitivity -she tried tylenol, but did not help -she tried sumatriptan last night and it helped some, but the headache came back today -feels like lack of sleep, grinding teeth and caffeine are triggers for her, works night shifts -she reports a long history of migraines -sumatriptan works, but she reports she can not take it before going to work because it makes her sleepy - currently more than 15 migraine days per month -BP continues to run high per her report - 155/112 (reports bottom number was lower on recheck) at work last week -she reports has had HTN for some time, uncontrolled and she feels it is triggering her migraines as well -resting pulse is low - runs in 50s- 70s when up and about -sister had angioedema with lisinopril so she prefers to avoid -taking 2.5mg  of norvasc daily -denies fevers, sinus issues, vision loss, weakness, numbness, worst headache of life, thunderclap headache   ROS: See pertinent positives and negatives per HPI.  Past Medical History:  Diagnosis Date  . Abnormal Pap smear 2011  . Anxiety   . Bipolar 1 disorder ([facility])   . Depression    on meds, doing well  . H/O bacterial infection   . H/O varicella   . Headache(784.0)    Frequent  . Hypertension   . Infection    UTI  . Migraine   . No pertinent past medical  history   . Sickle cell trait ([facility])   . Vaginal Pap smear, abnormal    repeat was normal  . Yeast infection     Past Surgical History:  Procedure Laterality Date  . DENTAL SURGERY    . NO PAST SURGERIES      Family History  Problem Relation Age of Onset  . Hypertension Mother   . Diabetes Maternal Grandmother   . Glaucoma Maternal Grandmother   . Hypertension Sister     SOCIAL HX: see hpi   Current Outpatient Medications:  .  amLODipine (NORVASC) 2.5 MG tablet, TAKE 1 TABLET BY MOUTH EVERY DAY, Disp: 30 tablet, Rfl: 2 .  hydrOXYzine (ATARAX/VISTARIL) 50 MG tablet, Take 1 tablet (50 mg total) by mouth 3 (three) times daily as needed for anxiety., Disp: 30 tablet, Rfl: 2 .  JUNEL FE 1/20 1-20 MG-MCG tablet, TAKE 1 TABLET BY MOUTH EVERY DAY, Disp: 3 Package, Rfl: 4 .  LATUDA 20 MG TABS tablet, TAKE 1 TABLET BY MOUTH DAILY WITH BREAKFAST, Disp: 30 tablet, Rfl: 2 .  ondansetron (ZOFRAN ODT) 8 MG disintegrating tablet, 1/2- 1 tablet q 8 hr prn nausea, vomiting, Disp: 20 tablet, Rfl: 0 .  SUMAtriptan (IMITREX) 100 MG tablet, Take at first sign of migraine. OK to repeat dose in 2 hours if headache persists. Not to take more than 2 tablets in 24 hours., Disp: 10 tablet, Rfl: 2  EXAM:  VITALS per patient if applicable:  GENERAL: alert, oriented, appears  well and in no acute distress  HEENT: atraumatic, conjunttiva clear, no obvious abnormalities on inspection of external nose and ears  NECK: normal movements of the head and neck  LUNGS: on inspection no signs of respiratory distress, breathing rate appears normal, no obvious gross SOB, gasping or wheezing  CV: no obvious cyanosis  MS: moves all visible extremities without noticeable abnormality  PSYCH/NEURO: pleasant and cooperative, no obvious depression or anxiety, speech and thought processing grossly intact  ASSESSMENT AND PLAN:  Discussed the following assessment and plan:  Other migraine without status migrainosus,  not intractable  Nonintractable headache, unspecified chronicity pattern, unspecified headache type  Nausea  Hypertension, unspecified type  Shift work sleep disorder  -we discussed possible serious and likely etiologies, options for evaluation and workup, limitations of telemedicine visit vs in person visit, treatment, treatment risks and precautions. Pt prefers to treat via telemedicine empirically rather then risking or undertaking an in person visit at this moment. Suspect migraine and uncontrolled hypertension. She opted to try upping her norvasc to 5mg . Will try limited NSAID or tylenol use and zofran for abortive medication. Also, ice and topical menthol. Sent refill on zofran - lower dose so may not make as drowsy. Also, she would likely benefit from a prophylactic medication - discussed options. She plans to discuss further with PCP. Offered neurology referral, but she wants to see if things get better with upping norvasc dose. Advised close follow up in 1-2 weeks.  Patient agrees to seek prompt in person care if worsening, new symptoms arise, or if is not improving with treatment.   I discussed the assessment and treatment plan with the patient. The patient was provided an opportunity to ask questions and all were answered. The patient agreed with the plan and demonstrated an understanding of the instructions.   The patient was advised to call back or seek an in-person evaluation if the symptoms worsen or if the condition fails to improve as anticipated.   Lucretia Kern, DO   Patient Instructions   -I sent the medication(s) we discussed to your pharmacy: Meds ordered this encounter  Medications  . amLODipine (NORVASC) 5 MG tablet    Sig: Take 1 tablet (5 mg total) by mouth daily.    Dispense:  30 tablet    Refill:  0  . ondansetron (ZOFRAN ODT) 4 MG disintegrating tablet    Sig: 1/2- 1 tablet q 8 hr prn nausea, vomiting    Dispense:  30 tablet    Refill:  0    You can try  aleve and zofran to see if it helps your headache today. You will want to be careful to not use Aleve more than two days per week (or any over the counter pain medications) as it can elevated you blood pressure and can cause rebound headaches.  You can also try ice and topical menthol such as tiger balm.  It will be very important to follow closely with your doctor to ensure your blood pressure is properly treated.   I think you would benefit from seeing a neurologist and starting a medication to prevent your migraines.  Please follow up with your doctor if you wish to see a neurologist.  Please let us know if you have any questions or concerns regarding this prescription.  I hope you are feeling better soon! Seek care promptly if your symptoms worsen, new concerns arise, you have more severe or unusual headaches or you are not improving with treatment.

## 2019-07-31 NOTE — Patient Instructions (Addendum)
-  I sent the medication(s) we discussed to your pharmacy: Meds ordered this encounter  Medications  . amLODipine (NORVASC) 5 MG tablet    Sig: Take 1 tablet (5 mg total) by mouth daily.    Dispense:  30 tablet    Refill:  0  . ondansetron (ZOFRAN ODT) 4 MG disintegrating tablet    Sig: 1/2- 1 tablet q 8 hr prn nausea, vomiting    Dispense:  30 tablet    Refill:  0   Follow up with your doctor in 1-2 weeks.  You can try aleve and zofran to see if it helps your headache today. You will want to be careful to not use Aleve more than two days per week (or any over the counter pain medications) as it can elevated you blood pressure and can cause rebound headaches.  You can also try ice and topical menthol such as tiger balm.  It will be very important to follow closely with your doctor to ensure your blood pressure is properly treated.   I think you would benefit from seeing a neurologist and starting a medication to prevent your migraines.  Please follow up with your doctor if you wish to see a neurologist.  Please let us know if you have any questions or concerns regarding this prescription.  I hope you are feeling better soon! Seek care promptly if your symptoms worsen, new concerns arise, you have more severe or unusual headaches or you are not improving with treatment.

## 2019-08-01 ENCOUNTER — Telehealth: Payer: Self-pay | Admitting: Family Medicine

## 2019-08-01 NOTE — Telephone Encounter (Signed)
Scheduled appt for April 7 at 1:30pm

## 2019-08-01 NOTE — Telephone Encounter (Signed)
-----   Message from Terressa Koyanagi, DO sent at 07/31/2019  4:38 PM EDT ----- Needs follow up with PCP in 1-2 weeks.

## 2019-08-22 ENCOUNTER — Telehealth: Payer: Self-pay | Admitting: *Deleted

## 2019-08-22 ENCOUNTER — Encounter: Payer: Self-pay | Admitting: Family Medicine

## 2019-08-22 ENCOUNTER — Telehealth (INDEPENDENT_AMBULATORY_CARE_PROVIDER_SITE_OTHER): Payer: Medicaid Other | Admitting: Family Medicine

## 2019-08-22 VITALS — BP 144/92

## 2019-08-22 DIAGNOSIS — B3731 Acute candidiasis of vulva and vagina: Secondary | ICD-10-CM

## 2019-08-22 DIAGNOSIS — I10 Essential (primary) hypertension: Secondary | ICD-10-CM | POA: Diagnosis not present

## 2019-08-22 DIAGNOSIS — B373 Candidiasis of vulva and vagina: Secondary | ICD-10-CM

## 2019-08-22 MED ORDER — AMLODIPINE BESYLATE 5 MG PO TABS: 7.5000 mg | ORAL_TABLET | Freq: Every day | ORAL | 1 refills | Status: DC

## 2019-08-22 NOTE — Telephone Encounter (Signed)
-----   Message from Wynn Banker, MD sent at 08/22/2019  2:00 PM EDT ----- Please set up lab visit for labs ordered 02/2019

## 2019-08-22 NOTE — Telephone Encounter (Signed)
Left a detailed message at the pts cell number to call back for a lab appt as below.   

## 2019-08-22 NOTE — Progress Notes (Signed)
Virtual Visit via Video Note: interface not working (patient unable to connect) so visit completed by telephone  I connected with Diana Roman on 08/22/19 at  1:30 PM EDT by a video enabled telemedicine application and verified that I am speaking with the correct person using two identifiers.  Location patient: home Location provider:work or home office Persons participating in the virtual visit: patient, provider  I discussed the limitations of evaluation and management by telemedicine and the availability of in person appointments. The patient expressed understanding and agreed to proceed.   Diana Roman DOB: Jul 29, 1985 Encounter date: 08/22/2019  This is a 34 y.o. female who presents with Chief Complaint  Patient presents with  . Follow-up    History of present illness: Last visit was March 16 with Dr. Maudie Mercury for virtual visit secondary to headache and hypertension.  At that visit Norvasc was increased to 5 mg daily.  Headaches have gotten a little better. Sharp pain in back of neck area up to head. Has to lay down in dark for day when she gets them. At work gets so bad she is nauseated, gets blurry vision. After dose increase she is getting less severe headaches and shorter duration. She is working 3rd shift, so usually getting just 4 hours of sleep. Tylenol actually makes the headache go away now when it wasn't doing anything before.   Has been using electronic bp monitor and using machine at work as well. Still in 140's/90's so not as bad as last week.   Had appointment with Dr. Toy Care - had increase in latuda, lamictal; has had 2 deaths, but feels she is managing this pretty well.   Has been getting a lot of yeast infections and feels that it is from her birth control. Hasn't been an issue for her in the past. Was on depo from [age] and then did the implant x 3 years; then was married x 9years.    No Known Allergies Current Meds  Medication Sig  . amLODipine (NORVASC) 5 MG tablet  Take 1.5 tablets (7.5 mg total) by mouth daily.  . hydrOXYzine (ATARAX/VISTARIL) 50 MG tablet Take 1 tablet (50 mg total) by mouth 3 (three) times daily as needed for anxiety.  Lenda Kelp FE 1/20 1-20 MG-MCG tablet TAKE 1 TABLET BY MOUTH EVERY DAY  . LATUDA 20 MG TABS tablet TAKE 1 TABLET BY MOUTH DAILY WITH BREAKFAST  . ondansetron (ZOFRAN ODT) 4 MG disintegrating tablet 1/2- 1 tablet q 8 hr prn nausea, vomiting  . SUMAtriptan (IMITREX) 100 MG tablet Take at first sign of migraine. OK to repeat dose in 2 hours if headache persists. Not to take more than 2 tablets in 24 hours.  . [DISCONTINUED] amLODipine (NORVASC) 5 MG tablet Take 1 tablet (5 mg total) by mouth daily.    Review of Systems  Constitutional: Negative for chills, fatigue and fever.  Respiratory: Negative for cough, chest tightness, shortness of breath and wheezing.   Cardiovascular: Negative for chest pain, palpitations and leg swelling.    Objective:  BP (!) 144/92   LMP 07/25/2019       BP Readings from Last 3 Encounters:  08/22/19 (!) 144/92  06/11/19 (!) 158/106  03/16/19 138/80   Wt Readings from Last 3 Encounters:  12/12/18 192 lb (87.1 kg)  11/29/18 225 lb 1.6 oz (102.1 kg)  09/29/18 185 lb (83.9 kg)    EXAM:  GENERAL: sounds alert, oriented, appears well and in no acute distress   LUNGS: no  sob noted during conversation   PSYCH/NEURO: pleasant and cooperative, no obvious depression or anxiety, speech and thought processing grossly intact    Assessment/Plan  1. Essential hypertension Improved, but still not at goal. Increase amlodipine to 7.5mg  daily. She will update me through mychart in 2 weeks time for further adjustment. Headache much improved with blood pressure improvement.  - amLODipine (NORVASC) 5 MG tablet; Take 1.5 tablets (7.5 mg total) by mouth daily.  Dispense: 135 tablet; Refill: 1  2. Yeast vaginitis We are going to start with bloodwork and then consider switching ocp to see if she  does better with alternative. Previously on depo/implant but had issues with weight gain.   Return for pending bloodwork and mychart update 2 weeks.    I discussed the assessment and treatment plan with the patient. The patient was provided an opportunity to ask questions and all were answered. The patient agreed with the plan and demonstrated an understanding of the instructions.   The patient was advised to call back or seek an in-person evaluation if the symptoms worsen or if the condition fails to improve as anticipated.  I provided 28 minutes of non-face-to-face time during this encounter.   Theodis Shove, MD

## 2019-08-30 ENCOUNTER — Telehealth: Payer: Self-pay | Admitting: Family Medicine

## 2019-08-30 DIAGNOSIS — Z309 Encounter for contraceptive management, unspecified: Secondary | ICD-10-CM

## 2019-08-30 MED ORDER — NORETHIN ACE-ETH ESTRAD-FE 1-20 MG-MCG PO TABS: 1.0000 | ORAL_TABLET | Freq: Every day | ORAL | 0 refills | Status: DC

## 2019-08-30 NOTE — Telephone Encounter (Signed)
Rx done. 

## 2019-08-30 NOTE — Telephone Encounter (Signed)
Pt forgot her birth control at home and she  is in [facility] for the next four days. She is wondering if it can be called into a pharmacy near her? She states she only needs 4 days worth   Medication Refill: JUNEL  Pharmacy: [id number] FAX: [phone]

## 2019-12-22 IMAGING — DX DG HAND COMPLETE 3+V*L*
3 series · 3 of 3 positions shown · non-contrast
Comparison: None.

CLINICAL DATA: Direct trauma to the left hand yesterday with pain
and swelling especially in the palm. Patient had difficulty with
positioning.

EXAM:
LEFT HAND - COMPLETE 3+ VIEW

[hand pa]
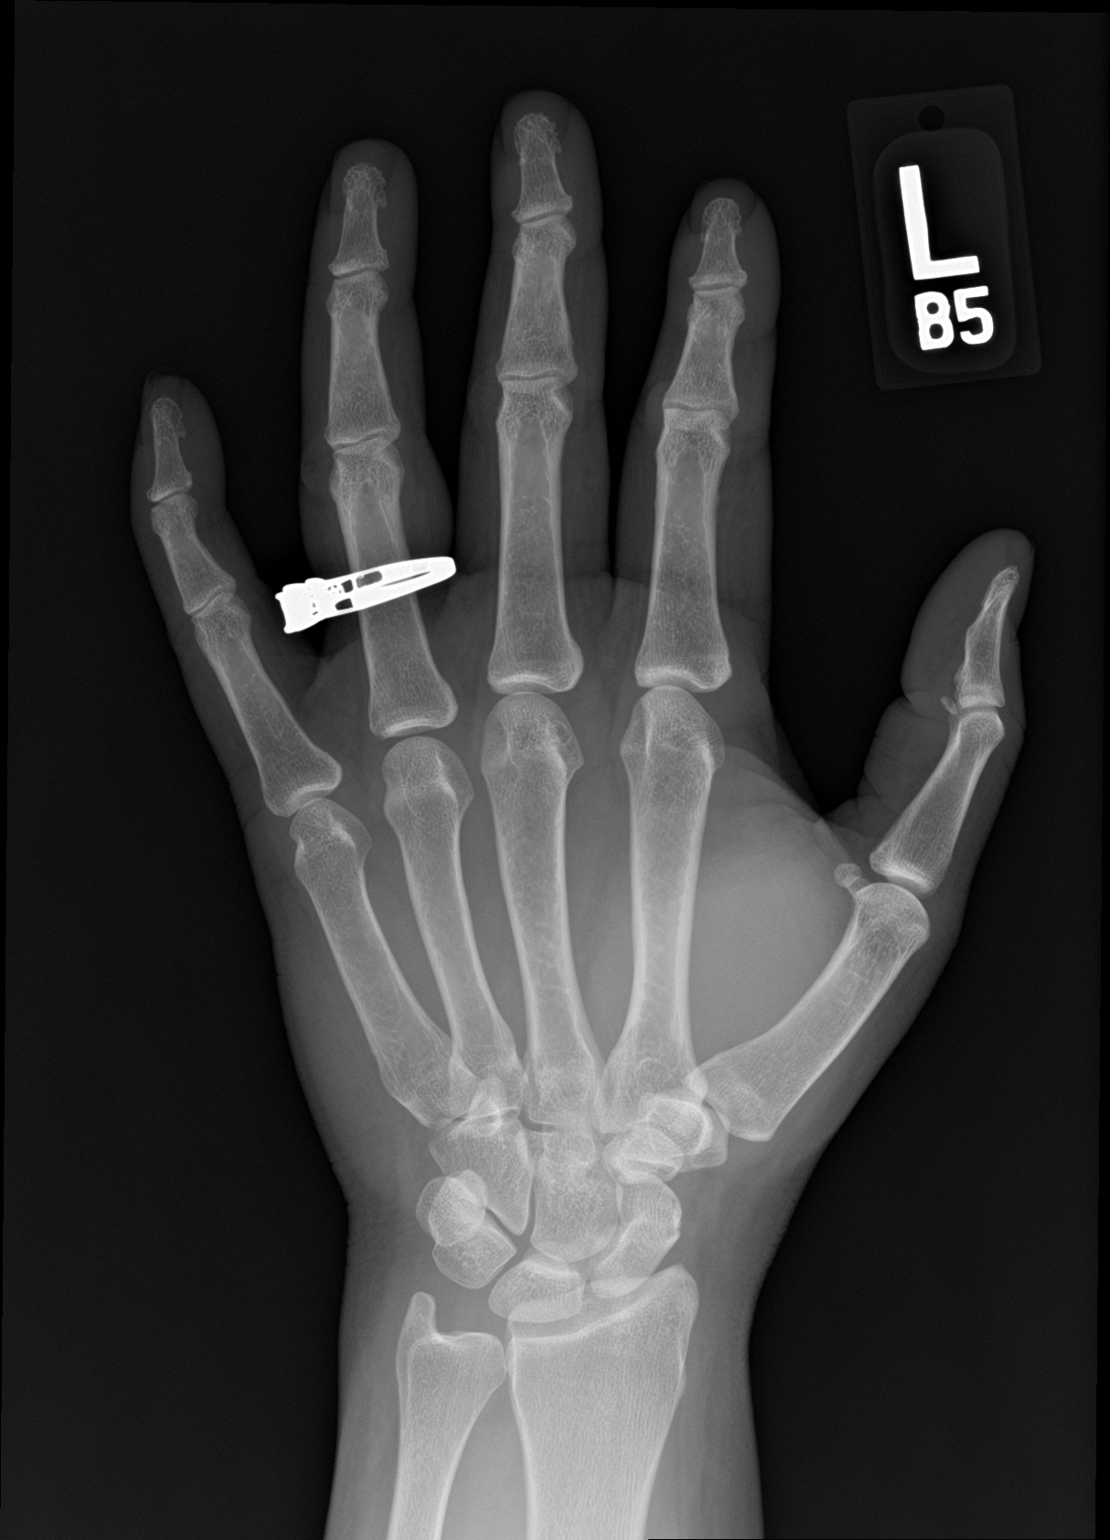

[hand obl]
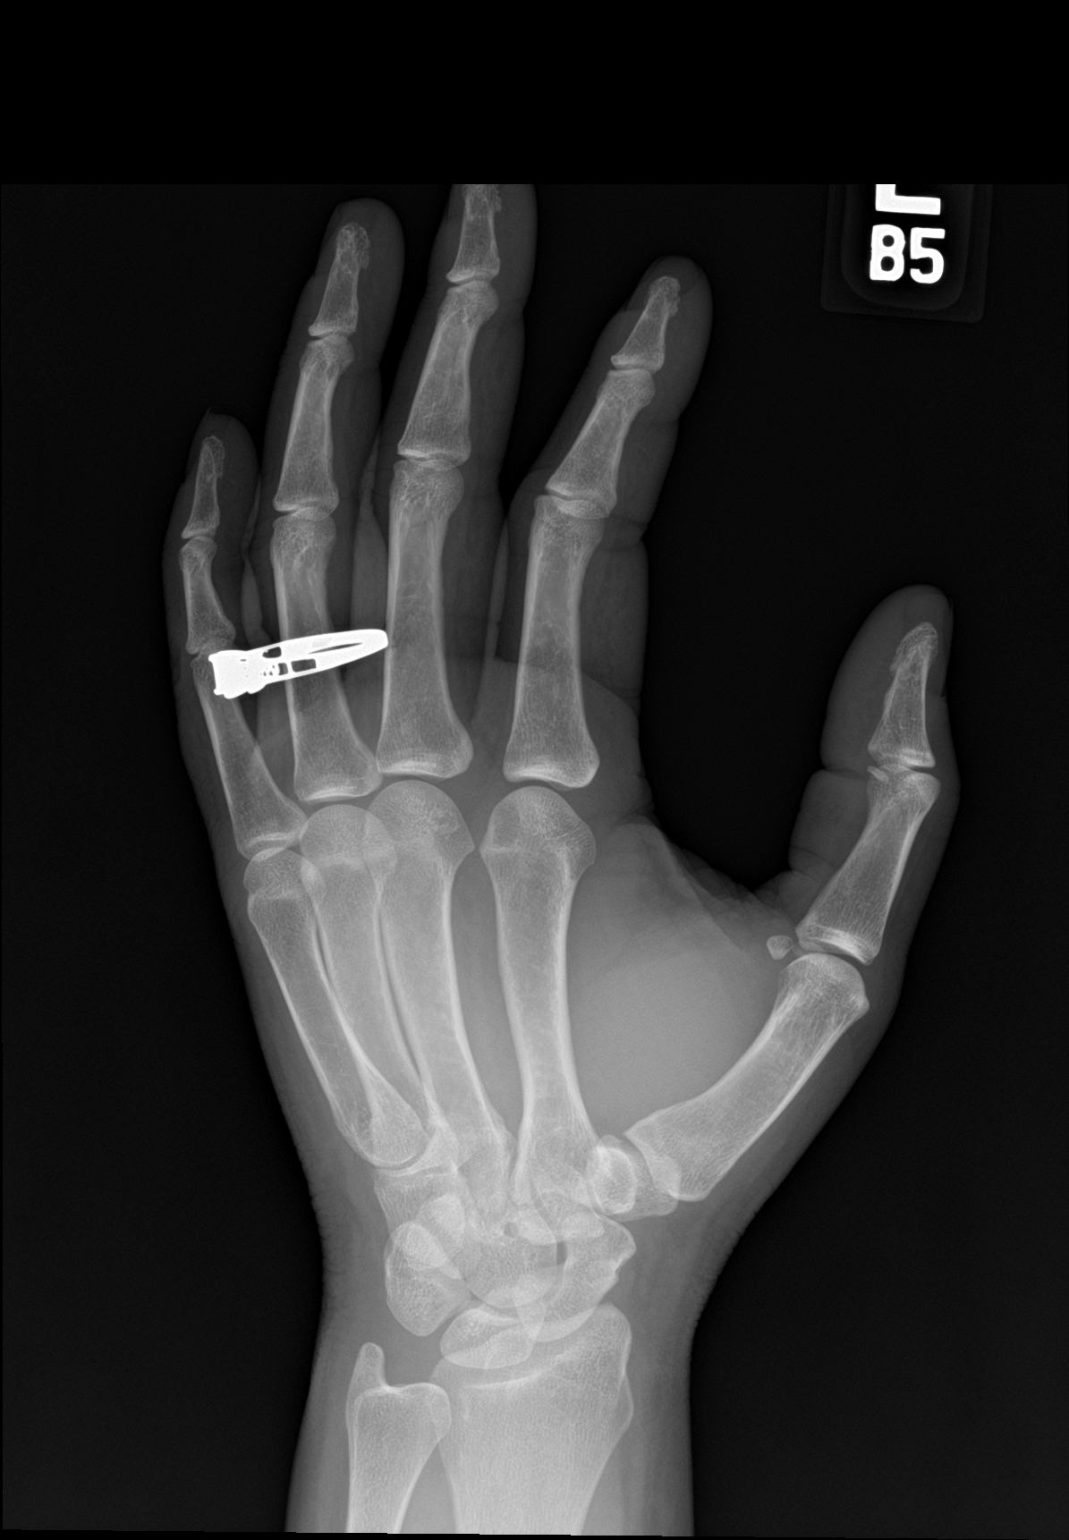

[hand lat]
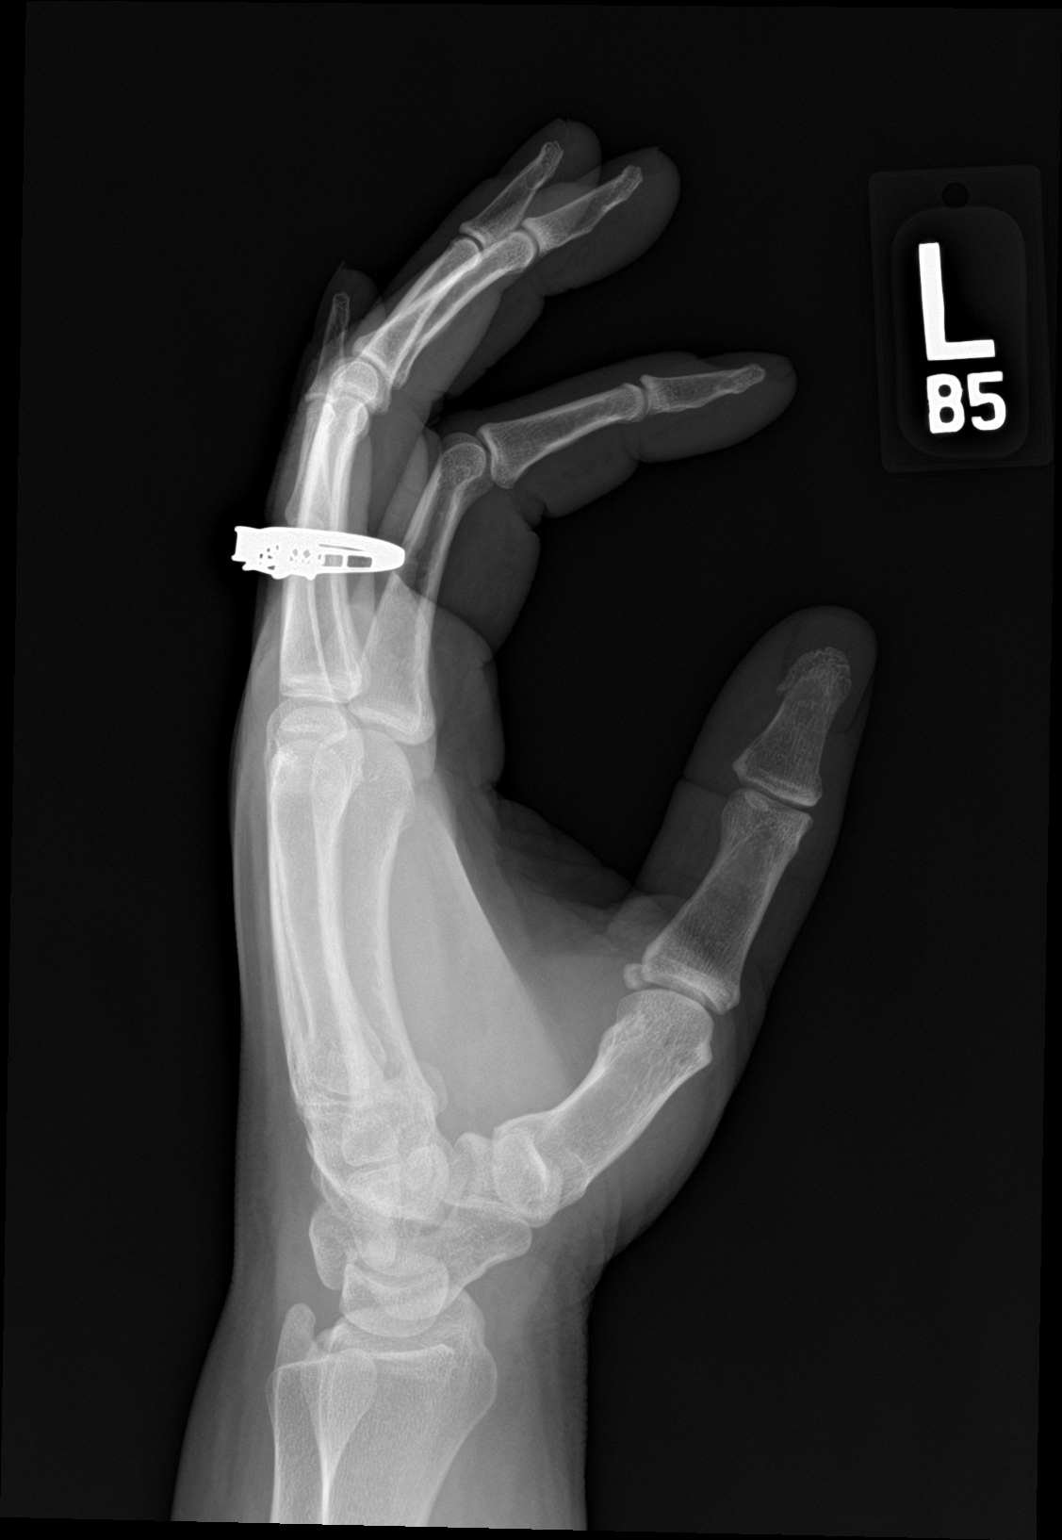

[3 of 3 positions shown; findings below may reference images not displayed]

FINDINGS: The bones are subjectively adequately mineralized. There is no acute
fracture nor dislocation. The joint spaces are reasonably
well-maintained. The soft tissues exhibit no foreign bodies or gas
collections. There is no definite soft tissue swelling.
IMPRESSION: There is no acute or significant chronic bony abnormality of the
left hand.

## 2020-01-04 ENCOUNTER — Encounter (HOSPITAL_COMMUNITY): Payer: Self-pay

## 2020-01-04 ENCOUNTER — Other Ambulatory Visit: Payer: Self-pay

## 2020-01-04 ENCOUNTER — Ambulatory Visit (HOSPITAL_COMMUNITY)
Admission: EM | Admit: 2020-01-04 | Discharge: 2020-01-04 | Disposition: A | Discharge status: Home / Self Care | Payer: Medicaid Other | Attending: Family Medicine | Admitting: Family Medicine

## 2020-01-04 DIAGNOSIS — R300 Dysuria: Secondary | ICD-10-CM | POA: Insufficient documentation

## 2020-01-04 LAB — POC URINE PREG, ED: Preg Test, Ur: NEGATIVE

## 2020-01-04 LAB — POCT URINALYSIS DIPSTICK, ED / UC
Bilirubin Urine: NEGATIVE
Glucose, UA: NEGATIVE mg/dL
Hgb urine dipstick: NEGATIVE
Ketones, ur: NEGATIVE mg/dL
Nitrite: NEGATIVE
Protein, ur: NEGATIVE mg/dL
Specific Gravity, Urine: >=1.03 (ref 1.005–1.030)
Urobilinogen, UA: 0.2 mg/dL (ref 0.0–1.0)
pH: 5.5 (ref 5.0–8.0)

## 2020-01-04 MED ORDER — METRONIDAZOLE 500 MG PO TABS: 500.0000 mg | ORAL_TABLET | Freq: Two times a day (BID) | ORAL | 0 refills | Status: DC

## 2020-01-04 NOTE — Discharge Instructions (Signed)
The urine test does not look like a bladder infection I am going to treat your for BV I am going to give you metronidazole for a week Consider going on a womens probiotic Check your culture results on My Chart You will be called if any tests are positive

## 2020-01-04 NOTE — ED Triage Notes (Signed)
Pt c/o increased urination x 2 days, states last time this happened she had BV. Has been having unprotected sex.

## 2020-01-04 NOTE — ED Provider Notes (Signed)
[facility]    CSN: [id number] Arrival date & time: 01/04/20  0801      History   Chief Complaint Chief Complaint  Patient presents with  . Urinary Frequency    HPI Diana Roman is a 34 y.o. female.   HPI  Here for urinary frequency Mild dysuria She says this is typical for her when she has BV She has no vaginal itching or odor No abdominal pain or NVD, no fever  Past Medical History:  Diagnosis Date  . Abnormal Pap smear 2011  . Anxiety   . Bipolar 1 disorder (HCC)   . Depression    on meds, doing well  . H/O bacterial infection   . H/O varicella   . Headache(784.0)    Frequent  . Hypertension   . Infection    UTI  . Migraine   . No pertinent past medical history   . Sickle cell trait (HCC)   . Vaginal Pap smear, abnormal    repeat was normal  . Yeast infection     Patient Active Problem List   Diagnosis Date Noted  . Hypertension 10/25/2018  . Bipolar 1 disorder, depressed, severe (HCC) 03/16/2018  . Suicide attempt [facility])     Past Surgical History:  Procedure Laterality Date  . DENTAL SURGERY    . NO PAST SURGERIES      OB History    Gravida  1   Para  1   Term  1   Preterm  0   AB  0   Living  1     SAB  0   TAB  0   Ectopic  0   Multiple  0   Live Births  1            Home Medications    Prior to Admission medications   Medication Sig Start Date End Date Taking? Authorizing Provider  lamoTRIgine (LAMICTAL) 25 MG tablet Take 25 mg by mouth daily.   Yes [provider]  nortriptyline (PAMELOR) 50 MG capsule Take 50 mg by mouth at bedtime.   Yes [provider]  amLODipine (NORVASC) 5 MG tablet Take 1.5 tablets (7.5 mg total) by mouth daily. 08/22/19   Wynn Banker, MD  hydrOXYzine (ATARAX/VISTARIL) 50 MG tablet Take 1 tablet (50 mg total) by mouth 3 (three) times daily as needed for anxiety. 10/25/18   Koberlein, Paris Lore, MD  LATUDA 20 MG TABS tablet TAKE 1 TABLET BY MOUTH DAILY  WITH BREAKFAST 02/16/19   Koberlein, Junell C, MD  metroNIDAZOLE (FLAGYL) 500 MG tablet Take 1 tablet (500 mg total) by mouth 2 (two) times daily. 01/04/20   Eustace Moore, MD    Family History Family History  Problem Relation Age of Onset  . Hypertension Mother   . Diabetes Maternal Grandmother   . Glaucoma Maternal Grandmother   . Hypertension Sister     Social History Social History   Tobacco Use  . Smoking status: Never Smoker  . Smokeless tobacco: Never Used  Vaping Use  . Vaping Use: Never used  Substance Use Topics  . Alcohol use: Yes    Comment: 2/wk  . Drug use: No     Allergies   Patient has no known allergies.   Review of Systems Review of Systems See HPI  Physical Exam Triage Vital Signs ED Triage Vitals  Enc Vitals Group     BP 01/04/20 0816 (!) 150/97     Pulse  Rate 01/04/20 0816 68     Resp 01/04/20 0816 16     Temp 01/04/20 0816 98.5 F (36.9 C)     Temp src --      SpO2 01/04/20 0816 99 %     Weight --      Height --      Head Circumference --      Peak Flow --      Pain Score 01/04/20 0819 0     Pain Loc --      Pain Edu? --      Excl. in GC? --    No data found.  Updated Vital Signs BP (!) 150/97   Pulse 68   Temp 98.5 F (36.9 C)   Resp 16   LMP 12/13/2019   SpO2 99%       Physical Exam Constitutional:      General: She is not in acute distress.    Appearance: She is well-developed.  HENT:     Head: Normocephalic and atraumatic.     Mouth/Throat:     Comments: Mask is in place Eyes:     Conjunctiva/sclera: Conjunctivae normal.     Pupils: Pupils are equal, round, and reactive to light.  Cardiovascular:     Rate and Rhythm: Normal rate.  Pulmonary:     Effort: Pulmonary effort is normal. No respiratory distress.  Genitourinary:    Comments: Discussed proper self swab Musculoskeletal:        General: Normal range of motion.     Cervical back: Normal range of motion.  Skin:    General: Skin is warm and dry.    Neurological:     Mental Status: She is alert.  Psychiatric:        Behavior: Behavior normal.      UC Treatments / Results  Labs (all labs ordered are listed, but only abnormal results are displayed) Labs Reviewed  POCT URINALYSIS DIPSTICK, ED / UC - Abnormal; Notable for the following components:      Result Value   Leukocytes,Ua SMALL (*)    All other components within normal limits  URINE CULTURE  POC URINE PREG, ED  CERVICOVAGINAL ANCILLARY ONLY   Pregnancy neg EKG   Radiology No results found.  Procedures Procedures (including critical care time)  Medications Ordered in UC Medications - No data to display  Initial Impression / Assessment and Plan / UC Course  I have reviewed the triage vital signs and the nursing notes.  Pertinent labs & imaging results that were available during my care of the patient were reviewed by me and considered in my medical decision making (see chart for details).     Patient has had unprotected sexual relations.  We will check for STD.  Because she has dysuria we will do a urine culture.  I suspect this is BV.  We will treat her for acute vaginitis.  We will watch for additional test results. Final Clinical Impressions(s) / UC Diagnoses   Final diagnoses:  Dysuria     Discharge Instructions     The urine test does not look like a bladder infection I am going to treat your for BV I am going to give you metronidazole for a week Consider going on a womens probiotic Check your culture results on My Chart You will be called if any tests are positive    ED Prescriptions    Medication Sig Dispense Auth. Provider   metroNIDAZOLE (FLAGYL) 500 MG tablet Take  1 tablet (500 mg total) by mouth 2 (two) times daily. 14 tablet Eustace Moore, MD     PDMP not reviewed this encounter.   Eustace Moore, MD 01/04/20 2234

## 2020-01-05 LAB — URINE CULTURE: Culture: NO GROWTH

## 2020-01-07 LAB — CERVICOVAGINAL ANCILLARY ONLY
Bacterial Vaginitis (gardnerella): POSITIVE — AB
Chlamydia: NEGATIVE
Comment: NEGATIVE
Comment: NEGATIVE
Comment: NEGATIVE
Comment: NORMAL
Neisseria Gonorrhea: NEGATIVE
Trichomonas: NEGATIVE

## 2020-01-10 ENCOUNTER — Other Ambulatory Visit: Payer: Self-pay

## 2020-01-10 ENCOUNTER — Encounter: Payer: Self-pay | Admitting: Family Medicine

## 2020-01-10 ENCOUNTER — Ambulatory Visit (INDEPENDENT_AMBULATORY_CARE_PROVIDER_SITE_OTHER): Payer: Medicaid Other | Admitting: Family Medicine

## 2020-01-10 VITALS — BP 126/88 | HR 82 | Temp 98.5°F | Wt 227.0 lb

## 2020-01-10 DIAGNOSIS — N760 Acute vaginitis: Secondary | ICD-10-CM

## 2020-01-10 DIAGNOSIS — K5900 Constipation, unspecified: Secondary | ICD-10-CM | POA: Diagnosis not present

## 2020-01-10 MED ORDER — MICONAZOLE NITRATE 2 % VA CREA: TOPICAL_CREAM | VAGINAL | 0 refills | Status: DC

## 2020-01-10 NOTE — Progress Notes (Signed)
Subjective:    Patient ID: Diana Roman, female    DOB: 1986-03-24, 34 y.o.   MRN: [id number]  No chief complaint on file.   HPI Patient was seen today for acute concern.  Pt endorses testing positive for BV on Friday.  Completing course of Flagyl.  Pt noted brown discharge yesterday.  Menses started today.  Pt with irritation of skin of vulva and constipation. Patient notes "trinkles" of urine when having to urinate.  Denies dysuria, frequency, back pain, suprapubic pain, n/v,fever, chills.  Past Medical History:  Diagnosis Date  . Abnormal Pap smear 2011  . Anxiety   . Bipolar 1 disorder (HCC)   . Depression    on meds, doing well  . H/O bacterial infection   . H/O varicella   . Headache(784.0)    Frequent  . Hypertension   . Infection    UTI  . Migraine   . No pertinent past medical history   . Sickle cell trait (HCC)   . Vaginal Pap smear, abnormal    repeat was normal  . Yeast infection     No Known Allergies  ROS General: Denies fever, chills, night sweats, changes in weight, changes in appetite HEENT: Denies headaches, ear pain, changes in vision, rhinorrhea, sore throat CV: Denies CP, palpitations, SOB, orthopnea Pulm: Denies SOB, cough, wheezing GI: Denies abdominal pain, nausea, vomiting, diarrhea  +constipation GU: Denies dysuria, hematuria, frequency  + decreased urine stream, vaginal irritation, vaginal discharge, recent BV dx being treated. Msk: Denies muscle cramps, joint pains Neuro: Denies weakness, numbness, tingling Skin: Denies rashes, bruising Psych: Denies depression, anxiety, hallucinations    Objective:    Blood pressure 126/88, pulse 82, temperature 98.5 F (36.9 C), temperature source Oral, weight 227 lb (103 kg), last menstrual period 12/13/2019, SpO2 98 %.  Gen. Pleasant, well-nourished, in no distress, normal affect   HEENT: [place]/AT, face symmetric, no scleral icterus, PERRLA, EOMI, nares patent without drainage Lungs: no accessory muscle  use, no wheezes or rales Cardiovascular: RRR, no peripheral edema.   Abdomen: BS present, soft, NT/ND, no hepatosplenomegaly.  No CVA tenderness Musculoskeletal: No deformities, no cyanosis or clubbing, normal tone Neuro:  A&Ox3, CN II-XII intact, normal gait Skin:  Warm, no lesions/ rash  Wt Readings from Last 3 Encounters:  12/12/18 192 lb (87.1 kg)  11/29/18 225 lb 1.6 oz (102.1 kg)  09/29/18 185 lb (83.9 kg)    Lab Results  Component Value Date   WBC 5.7 11/29/2018   HGB 10.7 (L) 11/29/2018   HCT 34.0 (L) 11/29/2018   PLT 221.0 11/29/2018   GLUCOSE 86 11/29/2018   CHOL 173 11/29/2018   TRIG 63.0 11/29/2018   HDL 48.70 11/29/2018   LDLCALC 111 (H) 11/29/2018   ALT 19 11/29/2018   AST 16 11/29/2018   NA 138 11/29/2018   K 4.3 11/29/2018   CL 107 11/29/2018   CREATININE 0.86 11/29/2018   BUN 18 11/29/2018   CO2 25 11/29/2018   TSH 2.08 11/29/2018   HGBA1C 5.4 03/18/2018    Assessment/Plan:  Acute vaginitis  -Patient encouraged to continue Flagyl for previously diagnosed BV -Discussed brownish discharge likely 2/2 recent start of menses -Given irritation we will treat with topical miconazole -pt unable to give a urine sample.  Will give water. - Plan: POCT urinalysis dipstick, miconazole (MONISTAT 7) 2 % vaginal cream  Constipation, unspecified constipation type -Discussed supportive care including increasing p.o. intake of water and vegetables -Consider MiraLAX daily as needed -Given handout  F/u prn with PCP.  Abbe Amsterdam, MD

## 2020-01-10 NOTE — Patient Instructions (Signed)
Vaginitis Vaginitis is a condition in which the vaginal tissue swells and becomes red (inflamed). This condition is most often caused by a change in the normal balance of bacteria and yeast that live in the vagina. This change causes an overgrowth of certain bacteria or yeast, which causes the inflammation. There are different types of vaginitis, but the most common types are:  Bacterial vaginosis.  Yeast infection (candidiasis).  Trichomoniasis vaginitis. This is a sexually transmitted disease (STD).  Viral vaginitis.  Atrophic vaginitis.  Allergic vaginitis. What are the causes? The cause of this condition depends on the type of vaginitis. It can be caused by:  Bacteria (bacterial vaginosis).  Yeast, which is a fungus (yeast infection).  A parasite (trichomoniasis vaginitis).  A virus (viral vaginitis).  Low hormone levels (atrophic vaginitis). Low hormone levels can occur during pregnancy, breastfeeding, or after menopause.  Irritants, such as bubble baths, scented tampons, and feminine sprays (allergic vaginitis). Other factors can change the normal balance of the yeast and bacteria that live in the vagina. These include:  Antibiotic medicines.  Poor hygiene.  Diaphragms, vaginal sponges, spermicides, birth control pills, and intrauterine devices (IUD).  Sex.  Infection.  Uncontrolled diabetes.  A weakened defense (immune) system. What increases the risk? This condition is more likely to develop in women who:  Smoke.  Use vaginal douches, scented tampons, or scented sanitary pads.  Wear tight-fitting pants.  Wear thong underwear.  Use oral birth control pills or an IUD.  Have sex without a condom.  Have multiple sex partners.  Have an STD.  Frequently use the spermicide nonoxynol-9.  Eat lots of foods high in sugar.  Have uncontrolled diabetes.  Have low estrogen levels.  Have a weakened immune system from an immune disorder or medical  treatment.  Are pregnant or breastfeeding. What are the signs or symptoms? Symptoms vary depending on the cause of the vaginitis. Common symptoms include:  Abnormal vaginal discharge. ? The discharge is white, gray, or yellow with bacterial vaginosis. ? The discharge is thick, white, and cheesy with a yeast infection. ? The discharge is frothy and yellow or greenish with trichomoniasis.  A bad vaginal smell. The smell is fishy with bacterial vaginosis.  Vaginal itching, pain, or swelling.  Sex that is painful.  Pain or burning when urinating. Sometimes there are no symptoms. How is this diagnosed? This condition is diagnosed based on your symptoms and medical history. A physical exam, including a pelvic exam, will also be done. You may also have other tests, including:  Tests to determine the pH level (acidity or alkalinity) of your vagina.  A whiff test, to assess the odor that results when a sample of your vaginal discharge is mixed with a potassium hydroxide solution.  Tests of vaginal fluid. A sample will be examined under a microscope. How is this treated? Treatment varies depending on the type of vaginitis you have. Your treatment may include:  Antibiotic creams or pills to treat bacterial vaginosis and trichomoniasis.  Antifungal medicines, such as vaginal creams or suppositories, to treat a yeast infection.  Medicine to ease discomfort if you have viral vaginitis. Your sexual partner should also be treated.  Estrogen delivered in a cream, pill, suppository, or vaginal ring to treat atrophic vaginitis. If vaginal dryness occurs, lubricants and moisturizing creams may help. You may need to avoid scented soaps, sprays, or douches.  Stopping use of a product that is causing allergic vaginitis. Then using a vaginal cream to treat the symptoms. Follow   these instructions at home: Lifestyle  Keep your genital area clean and dry. Avoid soap, and only rinse the area with  water.  Do not douche or use tampons until your health care provider says it is okay to do so. Use sanitary pads, if needed.  Do not have sex until your health care provider approves. When you can return to sex, practice safe sex and use condoms.  Wipe from front to back. This avoids the spread of bacteria from the rectum to the vagina. General instructions  Take over-the-counter and prescription medicines only as told by your health care provider.  If you were prescribed an antibiotic medicine, take or use it as told by your health care provider. Do not stop taking or using the antibiotic even if you start to feel better.  Keep all follow-up visits as told by your health care provider. This is important. How is this prevented?  Use mild, non-scented products. Do not use things that can irritate the vagina, such as fabric softeners. Avoid the following products if they are scented: ? Feminine sprays. ? Detergents. ? Tampons. ? Feminine hygiene products. ? Soaps or bubble baths.  Let air reach your genital area. ? Wear cotton underwear to reduce moisture buildup. ? Avoid wearing underwear while you sleep. ? Avoid wearing tight pants and underwear or nylons without a cotton panel. ? Avoid wearing thong underwear.  Take off any wet clothing, such as bathing suits, as soon as possible.  Practice safe sex and use condoms. Contact a health care provider if:  You have abdominal pain.  You have a fever.  You have symptoms that last for more than 2-3 days. Get help right away if:  You have a fever and your symptoms suddenly get worse. Summary  Vaginitis is a condition in which the vaginal tissue becomes inflamed.This condition is most often caused by a change in the normal balance of bacteria and yeast that live in the vagina.  Treatment varies depending on the type of vaginitis you have.  Do not douche, use tampons , or have sex until your health care provider approves. When  you can return to sex, practice safe sex and use condoms. This information is not intended to replace advice given to you by your health care provider. Make sure you discuss any questions you have with your health care provider. Document Revised: 04/15/2017 Document Reviewed: 06/08/2016 Elsevier Patient Education  2020 [employer].  Constipation, Adult Constipation is when a person has fewer bowel movements in a week than normal, has difficulty having a bowel movement, or has stools that are dry, hard, or larger than normal. Constipation may be caused by an underlying condition. It may become worse with age if a person takes certain medicines and does not take in enough fluids. Follow these instructions at home: Eating and drinking   Eat foods that have a lot of fiber, such as fresh fruits and vegetables, whole grains, and beans.  Limit foods that are high in fat, low in fiber, or overly processed, such as french fries, hamburgers, cookies, candies, and soda.  Drink enough fluid to keep your urine clear or pale yellow. General instructions  Exercise regularly or as told by your health care provider.  Go to the restroom when you have the urge to go. Do not hold it in.  Take over-the-counter and prescription medicines only as told by your health care provider. These include any fiber supplements.  Practice pelvic floor retraining exercises, such as  deep breathing while relaxing the lower abdomen and pelvic floor relaxation during bowel movements.  Watch your condition for any changes.  Keep all follow-up visits as told by your health care provider. This is important. Contact a health care provider if:  You have pain that gets worse.  You have a fever.  You do not have a bowel movement after 4 days.  You vomit.  You are not hungry.  You lose weight.  You are bleeding from the anus.  You have thin, pencil-like stools. Get help right away if:  You have a fever and your  symptoms suddenly get worse.  You leak stool or have blood in your stool.  Your abdomen is bloated.  You have severe pain in your abdomen.  You feel dizzy or you faint. This information is not intended to replace advice given to you by your health care provider. Make sure you discuss any questions you have with your health care provider. Document Revised: 04/15/2017 Document Reviewed: 10/22/2015 Elsevier Patient Education  2020 [employer].

## 2020-02-01 ENCOUNTER — Ambulatory Visit (INDEPENDENT_AMBULATORY_CARE_PROVIDER_SITE_OTHER): Payer: Medicaid Other | Admitting: Family Medicine

## 2020-02-01 ENCOUNTER — Encounter: Payer: Self-pay | Admitting: Family Medicine

## 2020-02-01 ENCOUNTER — Other Ambulatory Visit: Payer: Self-pay

## 2020-02-01 VITALS — BP 102/88 | HR 80 | Temp 98.9°F | Ht 66.0 in | Wt 229.0 lb

## 2020-02-01 DIAGNOSIS — M545 Low back pain, unspecified: Secondary | ICD-10-CM

## 2020-02-01 DIAGNOSIS — R350 Frequency of micturition: Secondary | ICD-10-CM

## 2020-02-01 LAB — POCT URINALYSIS DIPSTICK
Bilirubin, UA: NEGATIVE
Blood, UA: NEGATIVE
Glucose, UA: NEGATIVE
Ketones, UA: NEGATIVE
Leukocytes, UA: NEGATIVE
Nitrite, UA: NEGATIVE
Protein, UA: NEGATIVE
Spec Grav, UA: >=1.03 — AB (ref 1.010–1.025)
Urobilinogen, UA: 0.2 E.U./dL
pH, UA: 5.5 (ref 5.0–8.0)

## 2020-02-01 MED ORDER — ONDANSETRON 4 MG PO TBDP: 4.0000 mg | ORAL_TABLET | Freq: Three times a day (TID) | ORAL | 0 refills | Status: DC | PRN

## 2020-02-01 MED ORDER — METHOCARBAMOL 500 MG PO TABS: 500.0000 mg | ORAL_TABLET | Freq: Three times a day (TID) | ORAL | 0 refills | Status: DC | PRN

## 2020-02-01 NOTE — Progress Notes (Signed)
Established Patient Office Visit  Subjective:  Patient ID: Diana Roman, female    DOB: Nov 28, 1985  Age: 34 y.o. MRN: [id number]  CC:  Chief Complaint  Patient presents with  . Urinary Tract Infection    HPI Diana Roman presents for right sided lower back pain.  Onset couple days ago.  She thinks she may have had some increased urine frequency but no burning with urination.  No fever.  No chills.  No hematuria.  Her back pain is worse with movement and laying in certain positions.  She took some ibuprofen without much improvement.  Tylenol seemed to work better.  Denies any radiculitis symptoms.  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  Past Medical History:  Diagnosis Date  . Abnormal Pap smear 2011  . Anxiety   . Bipolar 1 disorder (HCC)   . Depression    on meds, doing well  . H/O bacterial infection   . H/O varicella   . Headache(784.0)    Frequent  . Hypertension   . Infection    UTI  . Migraine   . No pertinent past medical history   . Sickle cell trait (HCC)   . Vaginal Pap smear, abnormal    repeat was normal  . Yeast infection     Past Surgical History:  Procedure Laterality Date  . DENTAL SURGERY    . NO PAST SURGERIES      Family History  Problem Relation Age of Onset  . Hypertension Mother   . Diabetes Maternal Grandmother   . Glaucoma Maternal Grandmother   . Hypertension Sister     Social History   Socioeconomic History  . Marital status: Divorced    Spouse name: Not on file  . Number of children: Not on file  . Years of education: Not on file  . Highest education level: Not on file  Occupational History  . Not on file  Tobacco Use  . Smoking status: Never Smoker  . Smokeless tobacco: Never Used  Vaping Use  . Vaping Use: Never used  Substance and Sexual Activity  . Alcohol use: Yes    Comment: 2/wk  . Drug use: No  . Sexual activity: Yes    Birth control/protection: None  Other  Topics Concern  . Not on file  Social History Narrative  . Not on file   Social Determinants of Health   Financial Resource Strain:   . Difficulty of Paying Living Expenses: Not on file  Food Insecurity:   . Worried About Programme researcher, broadcasting/film/video in the Last Year: Not on file  . Ran Out of Food in the Last Year: Not on file  Transportation Needs:   . Lack of Transportation (Medical): Not on file  . Lack of Transportation (Non-Medical): Not on file  Physical Activity:   . Days of Exercise per Week: Not on file  . Minutes of Exercise per Session: Not on file  Stress:   . Feeling of Stress : Not on file  Social Connections:   . Frequency of Communication with Friends and Family: Not on file  . Frequency of Social Gatherings with Friends and Family: Not on file  . Attends Religious Services: Not on file  . Active Member of Clubs or Organizations: Not on file  . Attends Banker Meetings: Not on file  . Marital Status: Not on file  Intimate Partner Violence:   . Fear of  Current or Ex-Partner: Not on file  . Emotionally Abused: Not on file  . Physically Abused: Not on file  . Sexually Abused: Not on file    Outpatient Medications Prior to Visit  Medication Sig Dispense Refill  . amLODipine (NORVASC) 5 MG tablet Take 1.5 tablets (7.5 mg total) by mouth daily. 135 tablet 1  . hydrOXYzine (ATARAX/VISTARIL) 50 MG tablet Take 1 tablet (50 mg total) by mouth 3 (three) times daily as needed for anxiety. 30 tablet 2  . lamoTRIgine (LAMICTAL) 25 MG tablet Take 25 mg by mouth daily.    Marland Kitchen LATUDA 20 MG TABS tablet TAKE 1 TABLET BY MOUTH DAILY WITH BREAKFAST 30 tablet 2  . metroNIDAZOLE (FLAGYL) 500 MG tablet Take 1 tablet (500 mg total) by mouth 2 (two) times daily. 14 tablet 0  . miconazole (MONISTAT 7) 2 % vaginal cream Apply small amount to affected area daily 45 g 0  . nortriptyline (PAMELOR) 50 MG capsule Take 50 mg by mouth at bedtime.     No facility-administered medications  prior to visit.    No Known Allergies  ROS Review of Systems  Constitutional: Negative for chills and fever.  Gastrointestinal: Negative for abdominal pain.  Genitourinary: Positive for frequency. Negative for hematuria.  Musculoskeletal: Positive for back pain.  Neurological: Negative for weakness and numbness.      Objective:    Physical Exam Vitals reviewed.  Cardiovascular:     Rate and Rhythm: Normal rate and regular rhythm.  Pulmonary:     Effort: Pulmonary effort is normal.     Breath sounds: Normal breath sounds.  Musculoskeletal:     Comments: She has some tenderness to palpation right lower lumbar muscles  Neurological:     Mental Status: She is alert.     Comments: Full strength lower extremities.  2+ reflexes ankle and knee bilaterally     BP 102/88   Pulse 80   Temp 98.9 F (37.2 C) (Other (Comment))   Ht 5\' 6"  (1.676 m)   Wt 229 lb (103.9 kg)   SpO2 99%   BMI 36.96 kg/m  Wt Readings from Last 3 Encounters:  02/01/20 229 lb (103.9 kg)  01/10/20 227 lb (103 kg)  12/12/18 192 lb (87.1 kg)     Health Maintenance Due  Topic Date Due  . Hepatitis C Screening  Never done  . COVID-19 Vaccine (1) Never done  . HIV Screening  Never done  . PAP SMEAR-Modifier  12/15/2014  . INFLUENZA VACCINE  Never done    There are no preventive care reminders to display for this patient.  Lab Results  Component Value Date   TSH 2.08 11/29/2018   Lab Results  Component Value Date   WBC 5.7 11/29/2018   HGB 10.7 (L) 11/29/2018   HCT 34.0 (L) 11/29/2018   MCV 84.8 11/29/2018   PLT 221.0 11/29/2018   Lab Results  Component Value Date   NA 138 11/29/2018   K 4.3 11/29/2018   CO2 25 11/29/2018   GLUCOSE 86 11/29/2018   BUN 18 11/29/2018   CREATININE 0.86 11/29/2018   BILITOT 0.5 11/29/2018   ALKPHOS 45 11/29/2018   AST 16 11/29/2018   ALT 19 11/29/2018   PROT 6.9 11/29/2018   ALBUMIN 4.1 11/29/2018   CALCIUM 9.0 11/29/2018   ANIONGAP 6 09/29/2018    GFR 92.38 11/29/2018   Lab Results  Component Value Date   CHOL 173 11/29/2018   Lab Results  Component Value Date  HDL 48.70 11/29/2018   Lab Results  Component Value Date   LDLCALC 111 (H) 11/29/2018   Lab Results  Component Value Date   TRIG 63.0 11/29/2018   Lab Results  Component Value Date   CHOLHDL 4 11/29/2018   Lab Results  Component Value Date   HGBA1C 5.4 03/18/2018      Assessment & Plan:   Right-sided lower back pain.  Urine dipstick is normal.  Suspect musculoskeletal.  Neuro exam non-focal.    -Recommend over-the-counter topical sports creams and muscle massage -Consider trial of Robaxin 500 mg nightly.  She is aware this may cause some sedation. -Continue over-the-counter Tylenol or nonsteroidal as needed -Touch base if not improving over the next week or 2  Meds ordered this encounter  Medications  . methocarbamol (ROBAXIN) 500 MG tablet    Sig: Take 1 tablet (500 mg total) by mouth every 8 (eight) hours as needed for muscle spasms.    Dispense:  30 tablet    Refill:  0  . ondansetron (ZOFRAN ODT) 4 MG disintegrating tablet    Sig: Take 1 tablet (4 mg total) by mouth every 8 (eight) hours as needed for nausea or vomiting.    Dispense:  12 tablet    Refill:  0    Follow-up: No follow-ups on file.    Evelena Peat, MD

## 2020-02-01 NOTE — Patient Instructions (Signed)
Use the muscle relaxer with caution- may cause some sedation  Continue with topical heat and topical sports cream  Could also try OTC Diclofenac cream

## 2020-02-05 ENCOUNTER — Telehealth: Payer: Self-pay | Admitting: Family Medicine

## 2020-02-05 NOTE — Telephone Encounter (Signed)
Pt is calling to see if there is a prescription that will delay her cycle. She is going on a trip Friday and her cycle is due on Friday as well. She has extreme bleeding and nausea and doesn't want to experience that on her trip.

## 2020-02-08 NOTE — Telephone Encounter (Signed)
Spoke with the pt and she stated she is on a birth control pill and will skip the placebo week as instructed.  Patient was advised to call back with the name of the pill as she could not recall in order for me to add this to the medication list.

## 2020-02-08 NOTE — Telephone Encounter (Signed)
Is she still on birth control pill? It is not on her med list, but she was on it at the last visit. If she is still on it, she could skip the placebo week of pills and just go straight into next pack. If she is not on birth control now, then it is harder to delay as starting something may cause spotting/irregularity.

## 2020-04-16 ENCOUNTER — Telehealth: Payer: Self-pay | Admitting: Family Medicine

## 2020-04-16 NOTE — Telephone Encounter (Signed)
Patient is calling and wanted to see if a prescription for Prenantal Vitamins be sent to [facility] [address] [place]  [address] [place] [address]  Phone:  [phone] Fax:  [phone]  CB is [phone]

## 2020-04-16 NOTE — Telephone Encounter (Signed)
She is due for follow up visit for bp recheck in office, so please set this up. We can discuss prenatal at that time as I am not sure reason for it (although if pregnant let me know!).

## 2020-04-17 NOTE — Telephone Encounter (Signed)
Left a message for the pt to return my call.  

## 2020-04-23 NOTE — Telephone Encounter (Signed)
Patient informed of the message below and declined being pregnant.  Patient has an appt on 12/22 and stated she will follow up at that time.

## 2020-04-23 NOTE — Telephone Encounter (Signed)
Left a message for the pt to return my call.  

## 2020-05-07 ENCOUNTER — Encounter: Payer: Medicaid Other | Admitting: Family Medicine

## 2020-05-07 NOTE — Progress Notes (Deleted)
Diana Roman DOB: Apr 24, 1986 Encounter date: 05/07/2020  This is a 34 y.o. female who presents for complete physical   History of present illness/Additional concerns:  ***   Past Medical History:  Diagnosis Date  . Abnormal Pap smear 2011  . Anxiety   . Bipolar 1 disorder (HCC)   . Depression    on meds, doing well  . H/O bacterial infection   . H/O varicella   . Headache(784.0)    Frequent  . Hypertension   . Infection    UTI  . Migraine   . No pertinent past medical history   . Sickle cell trait (HCC)   . Vaginal Pap smear, abnormal    repeat was normal  . Yeast infection    Past Surgical History:  Procedure Laterality Date  . DENTAL SURGERY    . NO PAST SURGERIES     No Known Allergies No outpatient medications have been marked as taking for the 05/07/20 encounter (Appointment) with Wynn Banker, MD.   Social History   Tobacco Use  . Smoking status: Never Smoker  . Smokeless tobacco: Never Used  Substance Use Topics  . Alcohol use: Yes    Comment: 2/wk   Family History  Problem Relation Age of Onset  . Hypertension Mother   . Diabetes Maternal Grandmother   . Glaucoma Maternal Grandmother   . Hypertension Sister      Review of Systems  CBC:  Lab Results  Component Value Date   WBC 5.7 11/29/2018   HGB 10.7 (L) 11/29/2018   HCT 34.0 (L) 11/29/2018   MCH 27.4 09/29/2018   MCHC 31.4 11/29/2018   RDW 17.9 (H) 11/29/2018   PLT 221.0 11/29/2018   CMP: Lab Results  Component Value Date   NA 138 11/29/2018   K 4.3 11/29/2018   CL 107 11/29/2018   CO2 25 11/29/2018   ANIONGAP 6 09/29/2018   GLUCOSE 86 11/29/2018   BUN 18 11/29/2018   CREATININE 0.86 11/29/2018   GFRAA >60 09/29/2018   CALCIUM 9.0 11/29/2018   PROT 6.9 11/29/2018   BILITOT 0.5 11/29/2018   ALKPHOS 45 11/29/2018   ALT 19 11/29/2018   AST 16 11/29/2018   LIPID: Lab Results  Component Value Date   CHOL 173 11/29/2018   TRIG 63.0 11/29/2018   HDL 48.70  11/29/2018   LDLCALC 111 (H) 11/29/2018    Objective:  There were no vitals taken for this visit.      BP Readings from Last 3 Encounters:  02/01/20 102/88  01/10/20 126/88  01/04/20 (!) 150/97   Wt Readings from Last 3 Encounters:  02/01/20 229 lb (103.9 kg)  01/10/20 227 lb (103 kg)  12/12/18 192 lb (87.1 kg)    Physical Exam  Assessment/Plan: Health Maintenance Due  Topic Date Due  . Hepatitis C Screening  Never done  . COVID-19 Vaccine (1) Never done  . HIV Screening  Never done  . PAP SMEAR-Modifier  12/15/2014  . INFLUENZA VACCINE  Never done   Health Maintenance reviewed - {health maintenance:315237}.  There are no diagnoses linked to this encounter.  No follow-ups on file.  Theodis Shove, MD

## 2020-07-04 ENCOUNTER — Other Ambulatory Visit (HOSPITAL_COMMUNITY)
Admission: RE | Admit: 2020-07-04 | Discharge: 2020-07-04 | Disposition: A | Discharge status: Home / Self Care | Payer: Medicaid Other | Source: Ambulatory Visit | Attending: Family Medicine | Admitting: Family Medicine

## 2020-07-04 ENCOUNTER — Other Ambulatory Visit: Payer: Self-pay

## 2020-07-04 ENCOUNTER — Telehealth: Payer: Self-pay | Admitting: *Deleted

## 2020-07-04 ENCOUNTER — Ambulatory Visit (INDEPENDENT_AMBULATORY_CARE_PROVIDER_SITE_OTHER): Payer: Medicaid Other | Admitting: Family Medicine

## 2020-07-04 ENCOUNTER — Encounter: Payer: Self-pay | Admitting: Family Medicine

## 2020-07-04 VITALS — BP 128/90 | HR 76 | Temp 98.6°F | Ht 66.0 in | Wt 223.1 lb

## 2020-07-04 DIAGNOSIS — F314 Bipolar disorder, current episode depressed, severe, without psychotic features: Secondary | ICD-10-CM | POA: Diagnosis not present

## 2020-07-04 DIAGNOSIS — E559 Vitamin D deficiency, unspecified: Secondary | ICD-10-CM

## 2020-07-04 DIAGNOSIS — Z124 Encounter for screening for malignant neoplasm of cervix: Secondary | ICD-10-CM | POA: Insufficient documentation

## 2020-07-04 DIAGNOSIS — Z0000 Encounter for general adult medical examination without abnormal findings: Secondary | ICD-10-CM | POA: Diagnosis not present

## 2020-07-04 DIAGNOSIS — N920 Excessive and frequent menstruation with regular cycle: Secondary | ICD-10-CM

## 2020-07-04 DIAGNOSIS — R5383 Other fatigue: Secondary | ICD-10-CM

## 2020-07-04 DIAGNOSIS — Z319 Encounter for procreative management, unspecified: Secondary | ICD-10-CM

## 2020-07-04 DIAGNOSIS — Z13220 Encounter for screening for lipoid disorders: Secondary | ICD-10-CM

## 2020-07-04 DIAGNOSIS — I10 Essential (primary) hypertension: Secondary | ICD-10-CM

## 2020-07-04 DIAGNOSIS — Z131 Encounter for screening for diabetes mellitus: Secondary | ICD-10-CM

## 2020-07-04 DIAGNOSIS — R922 Inconclusive mammogram: Secondary | ICD-10-CM

## 2020-07-04 DIAGNOSIS — H0520 Unspecified exophthalmos: Secondary | ICD-10-CM

## 2020-07-04 MED ORDER — LABETALOL HCL 100 MG PO TABS: 100.0000 mg | ORAL_TABLET | Freq: Two times a day (BID) | ORAL | 2 refills | Status: DC

## 2020-07-04 MED ORDER — ONDANSETRON 4 MG PO TBDP
4.0000 mg | ORAL_TABLET | Freq: Three times a day (TID) | ORAL | 0 refills | Status: DC | PRN
Start: 2020-07-04 — End: 2020-11-12

## 2020-07-04 NOTE — Progress Notes (Signed)
Diana Roman DOB: 1985-11-14 Encounter date: 07/04/2020  This is a 35 y.o. female who presents for complete physical   History of present illness/Additional concerns: Last visit with me was 08/2019.   HTN:  Amlodipine 5mg  daily. She checked bp at home other day and this was high 170's/113. Never that high when she checks at work.   Bipolar: latuda 20mg  daily. Has been feeling really "blah" lately. Hasn't seen Dr. in awhile. Missed last appointment and needs to get back in. Just doesn't want to do anything; going to work and just coming home. Has been working on getting pregnant. Stopped taking ocp in July. Periods are regular. Ovulation causes pain, cramping during cycle. Menstruates for 7 days. Has 6 heavy days. Has to chang every 1.5 hours.   Had appointment with Dr. Evelene Croon - had increase in latuda, lamictal; has had 2 deaths, but feels she is managing this pretty well.   Hasn't seen gyn in a few years.   Past Medical History:  Diagnosis Date  . Abnormal Pap smear 2011  . Anxiety   . Bipolar 1 disorder (HCC)   . Depression    on meds, doing well  . H/O bacterial infection   . H/O varicella   . Headache(784.0)    Frequent  . Hypertension   . Infection    UTI  . Migraine   . No pertinent past medical history   . Sickle cell trait (HCC)   . Vaginal Pap smear, abnormal    repeat was normal  . Yeast infection    Past Surgical History:  Procedure Laterality Date  . DENTAL SURGERY    . NO PAST SURGERIES     No Known Allergies Current Meds  Medication Sig  . labetalol (NORMODYNE) 100 MG tablet Take 1 tablet (100 mg total) by mouth 2 (two) times daily.  Evelene Croon lamoTRIgine (LAMICTAL) 25 MG tablet Take 25 mg by mouth daily.  2012 LATUDA 20 MG TABS tablet TAKE 1 TABLET BY MOUTH DAILY WITH BREAKFAST  . Vitamin D, Ergocalciferol, (DRISDOL) 1.25 MG (50000 UNIT) CAPS capsule Take 1 capsule (50,000 Units total) by mouth every 7 (seven) days.  . [DISCONTINUED] amLODipine (NORVASC) 5 MG  tablet Take 1.5 tablets (7.5 mg total) by mouth daily.   Social History   Tobacco Use  . Smoking status: Never Smoker  . Smokeless tobacco: Never Used  Substance Use Topics  . Alcohol use: Yes    Comment: 2/wk   Family History  Problem Relation Age of Onset  . Hypertension Mother   . Diabetes Maternal Grandmother   . Glaucoma Maternal Grandmother   . Hypertension Sister      Review of Systems  Constitutional: Negative for activity change, appetite change, chills, fatigue, fever and unexpected weight change.  HENT: Negative for congestion, ear pain, hearing loss, sinus pressure, sinus pain, sore throat and trouble swallowing.   Eyes: Negative for pain and visual disturbance.  Respiratory: Negative for cough, chest tightness, shortness of breath and wheezing.   Cardiovascular: Negative for chest pain, palpitations and leg swelling.  Gastrointestinal: Negative for abdominal pain, blood in stool, constipation, diarrhea, nausea and vomiting.  Genitourinary: Negative for difficulty urinating and menstrual problem.  Musculoskeletal: Negative for arthralgias and back pain.  Skin: Negative for rash.  Neurological: Negative for dizziness, weakness, numbness and headaches.  Hematological: Negative for adenopathy. Does not bruise/bleed easily.  Psychiatric/Behavioral: Positive for decreased concentration. Negative for sleep disturbance and suicidal ideas. The patient is not nervous/anxious.  CBC:  Lab Results  Component Value Date   WBC 5.0 07/04/2020   HGB 10.5 (L) 07/04/2020   HCT 32.1 (L) 07/04/2020   MCH 27.1 07/04/2020   MCHC 32.7 07/04/2020   RDW 15.8 (H) 07/04/2020   PLT 279 07/04/2020   MPV 11.0 07/04/2020   CMP: Lab Results  Component Value Date   NA 139 07/04/2020   K 4.0 07/04/2020   CL 105 07/04/2020   CO2 25 07/04/2020   ANIONGAP 6 09/29/2018   GLUCOSE 87 07/04/2020   BUN 14 07/04/2020   CREATININE 0.81 07/04/2020   GFRAA >60 09/29/2018   CALCIUM 8.9  07/04/2020   PROT 6.8 07/04/2020   BILITOT 0.6 07/04/2020   ALKPHOS 45 11/29/2018   ALT 14 07/04/2020   AST 15 07/04/2020   LIPID: Lab Results  Component Value Date   CHOL 177 07/04/2020   TRIG 54 07/04/2020   HDL 52 07/04/2020   LDLCALC 111 (H) 07/04/2020    Objective:  BP 128/90 (BP Location: Left Arm, Patient Position: Sitting, Cuff Size: Normal)   Pulse 76   Temp 98.6 F (37 C) (Oral)   Ht 5\' 6"  (1.676 m)   Wt 223 lb 1.6 oz (101.2 kg)   LMP 06/26/2020 (Exact Date)   SpO2 97%   BMI 36.01 kg/m   Weight: 223 lb 1.6 oz (101.2 kg)   BP Readings from Last 3 Encounters:  07/04/20 128/90  02/01/20 102/88  01/10/20 126/88   Wt Readings from Last 3 Encounters:  07/04/20 223 lb 1.6 oz (101.2 kg)  02/01/20 229 lb (103.9 kg)  01/10/20 227 lb (103 kg)    Physical Exam Exam conducted with a chaperone present.  Constitutional:      General: She is not in acute distress.    Appearance: She is well-developed and well-nourished.  HENT:     Head: Normocephalic and atraumatic.     Right Ear: External ear normal.     Left Ear: External ear normal.     Mouth/Throat:     Mouth: Oropharynx is clear and moist.     Pharynx: No oropharyngeal exudate.  Eyes:     Conjunctiva/sclera: Conjunctivae normal.     Pupils: Pupils are equal, round, and reactive to light.     Comments: Exophthalmos noted bilaterally  Neck:     Thyroid: No thyromegaly.  Cardiovascular:     Rate and Rhythm: Normal rate and regular rhythm.     Heart sounds: Normal heart sounds. No murmur heard. No friction rub. No gallop.   Pulmonary:     Effort: Pulmonary effort is normal.     Breath sounds: Normal breath sounds.  Chest:       Comments: 2 cm oval density approximately 9 o'clock position, nontender left breast  Abdominal:     General: Bowel sounds are normal. There is no distension.     Palpations: Abdomen is soft. There is no mass.     Tenderness: There is no abdominal tenderness. There is no  guarding.     Hernia: No hernia is present.  Genitourinary:    Exam position: Supine.     Vagina: Normal.     Cervix: Normal.     Uterus: Normal.      Adnexa: Right adnexa normal and left adnexa normal.  Musculoskeletal:        General: No tenderness, deformity or edema. Normal range of motion.     Cervical back: Normal range of motion and neck supple.  Lymphadenopathy:  Cervical: No cervical adenopathy.  Skin:    General: Skin is warm and dry.     Findings: No rash.  Neurological:     Mental Status: She is alert and oriented to person, place, and time.     Deep Tendon Reflexes: Strength normal. Reflexes normal.     Reflex Scores:      Tricep reflexes are 2+ on the right side and 2+ on the left side.      Bicep reflexes are 2+ on the right side and 2+ on the left side.      Brachioradialis reflexes are 2+ on the right side and 2+ on the left side.      Patellar reflexes are 2+ on the right side and 2+ on the left side. Psychiatric:        Mood and Affect: Mood and affect normal.        Speech: Speech normal.        Behavior: Behavior normal.        Thought Content: Thought content normal.     Assessment/Plan: Health Maintenance Due  Topic Date Due  . COVID-19 Vaccine (1) Never done  . HIV Screening  Never done   Health Maintenance reviewed.  1. Preventative health care Discussed importance of benefit of regular exercise.  This will help with weight as well as maintaining blood pressure and overall health. - Hepatitis C antibody; Future - Iron, TIBC and Ferritin Panel; Future - Iron, TIBC and Ferritin Panel - Hepatitis C antibody  2. Bipolar 1 disorder, depressed, severe (HCC) She needs to follow-up with psychiatry.  I asked her to talk with him about medications that would be safe for her during pregnancy.  I think it is very important to get mood controlled prior to getting pregnant.  3. Desire for pregnancy I do think she needs to see gynecology.  Due to her  chronic medical conditions it would be ideal for her to establish care prior to getting pregnant so that they can make sure all chronic conditions are stable for pregnancy as well as making sure medications are safe.  See below. - HCG, Qualitative; Future - HCG, Qualitative  4. Fatigue, unspecified type - Vitamin B12; Future - Comprehensive metabolic panel; Future - Iron, TIBC and Ferritin Panel; Future - Iron, TIBC and Ferritin Panel - Comprehensive metabolic panel - Vitamin B12  5. Lipid screening - Lipid panel; Future - Lipid panel  6. Screening for diabetes mellitus - Hemoglobin A1c; Future - Hemoglobin A1c  7. Essential hypertension Blood pressures not well controlled today.  Additionally, she is not on pregnancy "friendly" medications.  Going to add labetalol 100 mg twice daily to help with blood pressure control. - Comprehensive metabolic panel; Future - CBC with Differential/Platelet; Future - CBC with Differential/Platelet - Comprehensive metabolic panel  9. Exophthalmus - TSH; Future - TSH  10. Vitamin D deficiency - VITAMIN D 25 Hydroxy (Vit-D Deficiency, Fractures); Future - VITAMIN D 25 Hydroxy (Vit-D Deficiency, Fractures)  11. Menorrhagia with regular cycle - CBC with Differential/Platelet; Future - Iron, TIBC and Ferritin Panel; Future - Iron, TIBC and Ferritin Panel - CBC with Differential/Platelet  12. Breast density Will get [place] for further evaluation   13. Cervical cancer screening - PAP []  Return in about 1 month (around 08/01/2020) for Chronic condition visit.  Theodis Shove, MD

## 2020-07-04 NOTE — Telephone Encounter (Signed)
done

## 2020-07-04 NOTE — Patient Instructions (Signed)
*  please talk with Dr. Evelene Croon about management of mood with pregnancy plans  *I am going to refer you to obgyn for pregnancy planning, esp due to bp being elevated.

## 2020-07-04 NOTE — Telephone Encounter (Signed)
Patient mentioned to Mykal at the end of her visit she would like to have a refill on Zofran. Message sent to PCP.

## 2020-07-07 LAB — IRON,TIBC AND FERRITIN PANEL
%SAT: 8 % (calc) — ABNORMAL LOW (ref 16–45)
Ferritin: 4 ng/mL — ABNORMAL LOW (ref 16–154)
Iron: 40 ug/dL (ref 40–190)
TIBC: 475 mcg/dL (calc) — ABNORMAL HIGH (ref 250–450)

## 2020-07-07 LAB — CBC WITH DIFFERENTIAL/PLATELET
Absolute Monocytes: 515 cells/uL (ref 200–950)
Basophils Absolute: 40 cells/uL (ref 0–200)
Basophils Relative: 0.8 %
Eosinophils Absolute: 70 cells/uL (ref 15–500)
Eosinophils Relative: 1.4 %
HCT: 32.1 % — ABNORMAL LOW (ref 35.0–45.0)
Hemoglobin: 10.5 g/dL — ABNORMAL LOW (ref 11.7–15.5)
Lymphs Abs: 1710 cells/uL (ref 850–3900)
MCH: 27.1 pg (ref 27.0–33.0)
MCHC: 32.7 g/dL (ref 32.0–36.0)
MCV: 82.9 fL (ref 80.0–100.0)
MPV: 11 fL (ref 7.5–12.5)
Monocytes Relative: 10.3 %
Neutro Abs: 2665 cells/uL (ref 1500–7800)
Neutrophils Relative %: 53.3 %
Platelets: 279 10*3/uL (ref 140–400)
RBC: 3.87 10*6/uL (ref 3.80–5.10)
RDW: 15.8 % — ABNORMAL HIGH (ref 11.0–15.0)
Total Lymphocyte: 34.2 %
WBC: 5 10*3/uL (ref 3.8–10.8)

## 2020-07-07 LAB — COMPREHENSIVE METABOLIC PANEL
AG Ratio: 1.3 (calc) (ref 1.0–2.5)
ALT: 14 U/L (ref 6–29)
AST: 15 U/L (ref 10–30)
Albumin: 3.9 g/dL (ref 3.6–5.1)
Alkaline phosphatase (APISO): 48 U/L (ref 31–125)
BUN: 14 mg/dL (ref 7–25)
CO2: 25 mmol/L (ref 20–32)
Calcium: 8.9 mg/dL (ref 8.6–10.2)
Chloride: 105 mmol/L (ref 98–110)
Creat: 0.81 mg/dL (ref 0.50–1.10)
Globulin: 2.9 g/dL (calc) (ref 1.9–3.7)
Glucose, Bld: 87 mg/dL (ref 65–99)
Potassium: 4 mmol/L (ref 3.5–5.3)
Sodium: 139 mmol/L (ref 135–146)
Total Bilirubin: 0.6 mg/dL (ref 0.2–1.2)
Total Protein: 6.8 g/dL (ref 6.1–8.1)

## 2020-07-07 LAB — LIPID PANEL
Cholesterol: 177 mg/dL (ref <200)
HDL: 52 mg/dL (ref >=50)
LDL Cholesterol (Calc): 111 mg/dL (calc) — ABNORMAL HIGH
Non-HDL Cholesterol (Calc): 125 mg/dL (calc) (ref <130)
Total CHOL/HDL Ratio: 3.4 (calc) (ref <5.0)
Triglycerides: 54 mg/dL (ref <150)

## 2020-07-07 LAB — VITAMIN B12: Vitamin B-12: 299 pg/mL (ref 200–1100)

## 2020-07-07 LAB — HEMOGLOBIN A1C
Hgb A1c MFr Bld: 5.5 % of total Hgb (ref <5.7)
Mean Plasma Glucose: 111 mg/dL
eAG (mmol/L): 6.2 mmol/L

## 2020-07-07 LAB — TSH: TSH: 1 mIU/L

## 2020-07-07 LAB — VITAMIN D 25 HYDROXY (VIT D DEFICIENCY, FRACTURES): Vit D, 25-Hydroxy: 11 ng/mL — ABNORMAL LOW (ref 30–100)

## 2020-07-07 LAB — HCG, SERUM, QUALITATIVE: Preg, Serum: NEGATIVE

## 2020-07-07 LAB — HEPATITIS C ANTIBODY
Hepatitis C Ab: NONREACTIVE
SIGNAL TO CUT-OFF: 0.01 (ref <1.00)

## 2020-07-08 ENCOUNTER — Other Ambulatory Visit: Payer: Self-pay | Admitting: Family Medicine

## 2020-07-08 DIAGNOSIS — Z319 Encounter for procreative management, unspecified: Secondary | ICD-10-CM

## 2020-07-08 DIAGNOSIS — R922 Inconclusive mammogram: Secondary | ICD-10-CM

## 2020-07-08 DIAGNOSIS — N6320 Unspecified lump in the left breast, unspecified quadrant: Secondary | ICD-10-CM

## 2020-07-09 LAB — CYTOLOGY - PAP
Comment: NEGATIVE
Diagnosis: NEGATIVE
High risk HPV: NEGATIVE

## 2020-07-10 MED ORDER — VITAMIN D (ERGOCALCIFEROL) 1.25 MG (50000 UNIT) PO CAPS: 50000.0000 [IU] | ORAL_CAPSULE | ORAL | 0 refills | Status: DC

## 2020-08-08 ENCOUNTER — Other Ambulatory Visit: Payer: Self-pay

## 2020-08-08 ENCOUNTER — Ambulatory Visit (HOSPITAL_COMMUNITY)
Admission: EM | Admit: 2020-08-08 | Discharge: 2020-08-08 | Disposition: A | Discharge status: Home / Self Care | Payer: Medicaid Other | Attending: Student | Admitting: Student

## 2020-08-08 ENCOUNTER — Encounter (HOSPITAL_COMMUNITY): Payer: Self-pay | Admitting: *Deleted

## 2020-08-08 DIAGNOSIS — Z3202 Encounter for pregnancy test, result negative: Secondary | ICD-10-CM | POA: Diagnosis not present

## 2020-08-08 DIAGNOSIS — I10 Essential (primary) hypertension: Secondary | ICD-10-CM

## 2020-08-08 DIAGNOSIS — Z319 Encounter for procreative management, unspecified: Secondary | ICD-10-CM

## 2020-08-08 DIAGNOSIS — Z8669 Personal history of other diseases of the nervous system and sense organs: Secondary | ICD-10-CM | POA: Diagnosis not present

## 2020-08-08 LAB — POCT URINALYSIS DIPSTICK, ED / UC
Bilirubin Urine: NEGATIVE
Glucose, UA: NEGATIVE mg/dL
Hgb urine dipstick: NEGATIVE
Ketones, ur: 15 mg/dL — AB
Leukocytes,Ua: NEGATIVE
Nitrite: NEGATIVE
Protein, ur: NEGATIVE mg/dL
Specific Gravity, Urine: >=1.03 (ref 1.005–1.030)
Urobilinogen, UA: 0.2 mg/dL (ref 0.0–1.0)
pH: 5.5 (ref 5.0–8.0)

## 2020-08-08 LAB — POC URINE PREG, ED: Preg Test, Ur: NEGATIVE

## 2020-08-08 NOTE — ED Triage Notes (Signed)
Pt reports her MD changed her BP meds  2 weeks ago and stil has high BP. BP 170/119.

## 2020-08-08 NOTE — ED Provider Notes (Signed)
[facility]    CSN: [id number] Arrival date & time: 08/08/20  0803      History   Chief Complaint Chief Complaint  Patient presents with  . Hypertension    HPI Diana Roman is a 35 y.o. female presenting with urinary frequency, high blood pressure, headaches (none currently). History anxiety, bipolar 1, frequent headaches, migraine, hypertension, UTI, yeast infection.  -For hypertension she is currently taking labetalol.  States that as she is trying to conceive, her primary care stopped her on amlodipine 3 weeks ago and started her on labetalol.  Has been monitoring blood pressures at home and this is been running 150s to 170s over 100s to 119.  Has not followed up with her primary care about this. -States that she has had tension headaches recently, rates these as 8 out of 10 but completely relieved by Tylenol.  Describes these as throbbing behind her forehead. -Trying to conceive, but states she has had her period since the last time she had sex. -States she is peeing more, but she is drinking more water.  Denies other urinary symptoms. -Adamantly denies any vision changes, chest pain, shortness of breath, dizziness, weakness or sensation changes in 1 arm or one leg.  HPI  Past Medical History:  Diagnosis Date  . Abnormal Pap smear 2011  . Anxiety   . Bipolar 1 disorder (HCC)   . Depression    on meds, doing well  . H/O bacterial infection   . H/O varicella   . Headache(784.0)    Frequent  . Hypertension   . Infection    UTI  . Migraine   . No pertinent past medical history   . Sickle cell trait (HCC)   . Vaginal Pap smear, abnormal    repeat was normal  . Yeast infection     Patient Active Problem List   Diagnosis Date Noted  . Hypertension 10/25/2018  . Bipolar 1 disorder, depressed, severe (HCC) 03/16/2018  . Suicide attempt [facility])     Past Surgical History:  Procedure Laterality Date  . DENTAL SURGERY    . NO PAST SURGERIES      OB  History    Gravida  1   Para  1   Term  1   Preterm  0   AB  0   Living  1     SAB  0   IAB  0   Ectopic  0   Multiple  0   Live Births  1            Home Medications    Prior to Admission medications   Medication Sig Start Date End Date Taking? Authorizing Provider  labetalol (NORMODYNE) 100 MG tablet Take 1 tablet (100 mg total) by mouth 2 (two) times daily. 07/04/20   Wynn Banker, MD  lamoTRIgine (LAMICTAL) 25 MG tablet Take 25 mg by mouth daily.    [provider]  LATUDA 20 MG TABS tablet TAKE 1 TABLET BY MOUTH DAILY WITH BREAKFAST 02/16/19   Koberlein, Junell C, MD  ondansetron (ZOFRAN ODT) 4 MG disintegrating tablet Take 1 tablet (4 mg total) by mouth every 8 (eight) hours as needed for nausea or vomiting. 07/04/20   Koberlein, Paris Lore, MD  Vitamin D, Ergocalciferol, (DRISDOL) 1.25 MG (50000 UNIT) CAPS capsule Take 1 capsule (50,000 Units total) by mouth every 7 (seven) days. 07/10/20   Wynn Banker, MD    Family History Family History  Problem Relation  Age of Onset  . Hypertension Mother   . Diabetes Maternal Grandmother   . Glaucoma Maternal Grandmother   . Hypertension Sister     Social History Social History   Tobacco Use  . Smoking status: Never Smoker  . Smokeless tobacco: Never Used  Vaping Use  . Vaping Use: Never used  Substance Use Topics  . Alcohol use: Yes    Comment: 2/wk  . Drug use: No     Allergies   Patient has no known allergies.   Review of Systems Review of Systems  Constitutional: Negative for appetite change, chills, diaphoresis, fatigue and fever.  HENT: Negative for congestion, sinus pressure, sore throat, trouble swallowing and voice change.   Eyes: Negative for photophobia, pain, discharge, redness, itching and visual disturbance.  Respiratory: Negative for cough, chest tightness and shortness of breath.   Cardiovascular: Negative for chest pain, palpitations and leg swelling.   Gastrointestinal: Negative for abdominal pain, blood in stool, constipation, diarrhea, nausea and vomiting.  Genitourinary: Negative for decreased urine volume, difficulty urinating, dysuria, flank pain, frequency, genital sores, hematuria and urgency.  Musculoskeletal: Negative for back pain, gait problem, myalgias, neck pain and neck stiffness.  Neurological: Positive for headaches. Negative for dizziness, tremors, seizures, syncope, facial asymmetry, speech difficulty, weakness, light-headedness and numbness.  Psychiatric/Behavioral: Negative for agitation, decreased concentration, dysphoric mood, hallucinations and suicidal ideas. The patient is not nervous/anxious.   All other systems reviewed and are negative.    Physical Exam Triage Vital Signs ED Triage Vitals  Enc Vitals Group     BP      Pulse      Resp      Temp      Temp src      SpO2      Weight      Height      Head Circumference      Peak Flow      Pain Score      Pain Loc      Pain Edu?      Excl. in GC?    No data found.  Updated Vital Signs BP (!) 170/116 (BP Location: Right Arm)   Pulse 72   Temp 99 F (37.2 C) (Oral)   Resp 18   LMP 07/24/2020   SpO2 98%   Visual Acuity Right Eye Distance:   Left Eye Distance:   Bilateral Distance:    Right Eye Near:   Left Eye Near:    Bilateral Near:     Physical Exam Vitals reviewed.  Constitutional:      General: She is not in acute distress.    Appearance: Normal appearance. She is not ill-appearing.  HENT:     Head: Normocephalic and atraumatic.  Eyes:     Extraocular Movements: Extraocular movements intact.     Pupils: Pupils are equal, round, and reactive to light.  Cardiovascular:     Rate and Rhythm: Normal rate and regular rhythm.     Heart sounds: Normal heart sounds.  Pulmonary:     Effort: Pulmonary effort is normal.     Breath sounds: Normal breath sounds. No wheezing, rhonchi or rales.  Musculoskeletal:     Cervical back: Normal  range of motion and neck supple. No rigidity.  Lymphadenopathy:     Cervical: No cervical adenopathy.  Skin:    General: Skin is warm.     Capillary Refill: Capillary refill takes less than 2 seconds.  Neurological:     General: No  focal deficit present.     Mental Status: She is alert and oriented to person, place, and time. Mental status is at baseline.     Cranial Nerves: Cranial nerves are intact. No cranial nerve deficit or facial asymmetry.     Sensory: Sensation is intact. No sensory deficit.     Motor: Motor function is intact. No weakness.     Coordination: Coordination is intact. Coordination normal.     Gait: Gait is intact. Gait normal.     Comments: CN 2-12 intact. No weakness or numbness in UEs or LEs.  Psychiatric:        Mood and Affect: Mood normal.        Behavior: Behavior normal.        Thought Content: Thought content normal.        Judgment: Judgment normal.      UC Treatments / Results  Labs (all labs ordered are listed, but only abnormal results are displayed) Labs Reviewed  POCT URINALYSIS DIPSTICK, ED / UC - Abnormal; Notable for the following components:      Result Value   Ketones, ur 15 (*)    All other components within normal limits  POC URINE PREG, ED    EKG   Radiology No results found.  Procedures Procedures (including critical care time)  Medications Ordered in UC Medications - No data to display  Initial Impression / Assessment and Plan / UC Course  I have reviewed the triage vital signs and the nursing notes.  Pertinent labs & imaging results that were available during my care of the patient were reviewed by me and considered in my medical decision making (see chart for details).     This patient is a 35 year old female presenting with poorly controlled hypertension. Today this pt is afebrile nontachycardic nontachypneic, oxygenating well on room air, no wheezes rhonchi or rales. Benign neuro exam; no current headaches, vision  changes, weakness/sensation changes in one arm or one leg, shortness of breath.   BP initially 170/116. 150/111 on repeat.  UA wnl, urine pregnancy negative.  This patient is trying to conceive. Hypertension was previously reasonably well controlled on amlodipine and labetalol, but currently she is only taking labetalol. She does not know what dosage she is taking. No red flag symptoms.  Advised pt to call her primary care IMMEDIATELY upon discharge today to discuss medication titration. As she is currently trying to conceive, and BP is reasonably well controlled on labetalol. I will not start new medication today.  Verbalizes understanding and agreement.  She understands if she develops worst headache of life, vision changes, weakness or sensation changes in 1 arm or 1 leg, dizziness, shortness of breath-head straight to the ER.  Verbalizes understanding and agreement.  This chart was dictated using voice recognition software, Dragon. Despite the best efforts of this provider to proofread and correct errors, errors may still occur which can change documentation meaning.  Final Clinical Impressions(s) / UC Diagnoses   Final diagnoses:  Essential hypertension  Desire for pregnancy  Negative pregnancy test  History of migraine     Discharge Instructions     -Call your primary care provider immediately after discharge from our clinic today.  Request the earliest appointment for medication titration. -If you develop the worst headache of your life, vision changes, dizziness, shortness of breath, loss of consciousness, weakness or sensation changes in 1 arm or 1 leg-head straight to the ER or call 911.    ED Prescriptions  None     PDMP not reviewed this encounter.   Rhys Martini, PA-C 08/08/20 [phone]

## 2020-08-08 NOTE — Discharge Instructions (Signed)
-  Call your primary care provider immediately after discharge from our clinic today.  Request the earliest appointment for medication titration. -If you develop the worst headache of your life, vision changes, dizziness, shortness of breath, loss of consciousness, weakness or sensation changes in 1 arm or 1 leg-head straight to the ER or call 911.

## 2020-08-12 ENCOUNTER — Telehealth: Payer: Self-pay | Admitting: Family Medicine

## 2020-08-12 NOTE — Telephone Encounter (Signed)
Pt states the labetalol (NORMODYNE) 100 MG tablet is causing her to have severe cramps; she would like to know what should she do [phone]

## 2020-08-13 NOTE — Telephone Encounter (Signed)
*  has this caused cramps since starting med? And is she talking about leg cramps? Or other cramps? *she also had deficiencies that could cause cramping - iron, vitamin D so make sure she is replacing those per previous result note.  *how are blood pressures?   This would all be better addressed in office visit, but if she can answer these we can work on improving things until I can see her.

## 2020-08-13 NOTE — Telephone Encounter (Signed)
Left a message for the pt to return my call.  

## 2020-08-29 ENCOUNTER — Other Ambulatory Visit: Payer: Medicaid Other

## 2020-10-07 ENCOUNTER — Ambulatory Visit (HOSPITAL_COMMUNITY)
Admission: EM | Admit: 2020-10-07 | Discharge: 2020-10-07 | Disposition: A | Discharge status: Home / Self Care | Payer: Medicaid Other | Attending: Family Medicine | Admitting: Family Medicine

## 2020-10-07 ENCOUNTER — Encounter (HOSPITAL_COMMUNITY): Payer: Self-pay | Admitting: Emergency Medicine

## 2020-10-07 ENCOUNTER — Other Ambulatory Visit: Payer: Self-pay

## 2020-10-07 DIAGNOSIS — N3000 Acute cystitis without hematuria: Secondary | ICD-10-CM | POA: Insufficient documentation

## 2020-10-07 LAB — POC URINE PREG, ED: Preg Test, Ur: NEGATIVE

## 2020-10-07 LAB — POCT URINALYSIS DIPSTICK, ED / UC
Bilirubin Urine: NEGATIVE
Glucose, UA: NEGATIVE mg/dL
Hgb urine dipstick: NEGATIVE
Ketones, ur: NEGATIVE mg/dL
Nitrite: NEGATIVE
Protein, ur: NEGATIVE mg/dL
Specific Gravity, Urine: 1.02 (ref 1.005–1.030)
Urobilinogen, UA: 0.2 mg/dL (ref 0.0–1.0)
pH: 6 (ref 5.0–8.0)

## 2020-10-07 MED ORDER — NITROFURANTOIN MONOHYD MACRO 100 MG PO CAPS: 100.0000 mg | ORAL_CAPSULE | Freq: Two times a day (BID) | ORAL | 0 refills | Status: AC

## 2020-10-07 MED ORDER — IBUPROFEN 600 MG PO TABS: 600.0000 mg | ORAL_TABLET | Freq: Four times a day (QID) | ORAL | 0 refills | Status: DC | PRN

## 2020-10-07 NOTE — ED Triage Notes (Signed)
Pt presents with abdominal pain and urinary frequency xs 3 days. States has been taking tylenol for the pain.

## 2020-10-07 NOTE — ED Provider Notes (Signed)
[facility]    CSN: [id number] Arrival date & time: 10/07/20  0803      History   Chief Complaint Chief Complaint  Patient presents with  . Abdominal Pain  . Urinary Frequency    HPI Diana Roman is a 35 y.o. female.   HPI  Patient presents today with lower abdominal pain with urinary frequency which is progressively worsened over 3 days. She has a history of recurrent urinary tract infections although has not had an infection recently.  She denies any nausea or vomiting.  Past Medical History:  Diagnosis Date  . Abnormal Pap smear 2011  . Anxiety   . Bipolar 1 disorder (HCC)   . Depression    on meds, doing well  . H/O bacterial infection   . H/O varicella   . Headache(784.0)    Frequent  . Hypertension   . Infection    UTI  . Migraine   . No pertinent past medical history   . Sickle cell trait (HCC)   . Vaginal Pap smear, abnormal    repeat was normal  . Yeast infection     Patient Active Problem List   Diagnosis Date Noted  . Hypertension 10/25/2018  . Bipolar 1 disorder, depressed, severe (HCC) 03/16/2018  . Suicide attempt [facility])     Past Surgical History:  Procedure Laterality Date  . DENTAL SURGERY    . NO PAST SURGERIES      OB History    Gravida  1   Para  1   Term  1   Preterm  0   AB  0   Living  1     SAB  0   IAB  0   Ectopic  0   Multiple  0   Live Births  1            Home Medications    Prior to Admission medications   Medication Sig Start Date End Date Taking? Authorizing Provider  labetalol (NORMODYNE) 100 MG tablet Take 1 tablet (100 mg total) by mouth 2 (two) times daily. 07/04/20   Wynn Banker, MD  lamoTRIgine (LAMICTAL) 25 MG tablet Take 25 mg by mouth daily.    [provider]  LATUDA 20 MG TABS tablet TAKE 1 TABLET BY MOUTH DAILY WITH BREAKFAST 02/16/19   Koberlein, Junell C, MD  ondansetron (ZOFRAN ODT) 4 MG disintegrating tablet Take 1 tablet (4 mg total) by mouth every  8 (eight) hours as needed for nausea or vomiting. 07/04/20   Koberlein, Paris Lore, MD  Vitamin D, Ergocalciferol, (DRISDOL) 1.25 MG (50000 UNIT) CAPS capsule Take 1 capsule (50,000 Units total) by mouth every 7 (seven) days. 07/10/20   Wynn Banker, MD    Family History Family History  Problem Relation Age of Onset  . Hypertension Mother   . Diabetes Maternal Grandmother   . Glaucoma Maternal Grandmother   . Hypertension Sister     Social History Social History   Tobacco Use  . Smoking status: Never Smoker  . Smokeless tobacco: Never Used  Vaping Use  . Vaping Use: Never used  Substance Use Topics  . Alcohol use: Yes    Comment: 2/wk  . Drug use: No     Allergies   Patient has no known allergies.   Review of Systems Review of Systems Pertinent negatives listed in HPI  Physical Exam Triage Vital Signs ED Triage Vitals  Enc Vitals Group     BP  10/07/20 0821 (!) 155/103     Pulse Rate 10/07/20 0821 84     Resp 10/07/20 0821 16     Temp 10/07/20 0821 98.3 F (36.8 C)     Temp Source 10/07/20 0821 Oral     SpO2 10/07/20 0821 98 %     Weight --      Height --      Head Circumference --      Peak Flow --      Pain Score 10/07/20 0820 6     Pain Loc --      Pain Edu? --      Excl. in GC? --    No data found.  Updated Vital Signs BP (!) 155/103 (BP Location: Right Arm)   Pulse 84   Temp 98.3 F (36.8 C) (Oral)   Resp 16   LMP 09/20/2020   SpO2 98%   Visual Acuity Right Eye Distance:   Left Eye Distance:   Bilateral Distance:    Right Eye Near:   Left Eye Near:    Bilateral Near:     Physical Exam General appearance: Alert, well developed, well nourished, cooperative  Head: Normocephalic, without obvious abnormality, atraumatic Respiratory: Respirations even and unlabored, normal respiratory rate Heart: rate and rhythm normal. No gallop or murmurs noted on exam  Abdomen: BS +, no distention, no rebound tenderness, right flank tenderness left  flank normal  Extremities: No gross deformities Skin: Skin color, texture, turgor normal. No rashes seen  Psych: Appropriate mood and affect. Neurologic: GCS 15, normal gait normal coordination  UC Treatments / Results  Labs (all labs ordered are listed, but only abnormal results are displayed) Labs Reviewed  POCT URINALYSIS DIPSTICK, ED / UC - Abnormal; Notable for the following components:      Result Value   Leukocytes,Ua LARGE (*)    All other components within normal limits  URINE CULTURE  POC URINE PREG, ED    EKG   Radiology No results found.  Procedures Procedures (including critical care time)  Medications Ordered in UC Medications - No data to display  Initial Impression / Assessment and Plan / UC Course  I have reviewed the triage vital signs and the nursing notes.  Pertinent labs & imaging results that were available during my care of the patient were reviewed by me and considered in my medical decision making (see chart for details).     UA significant for large, will cover today for a urinary tract infection.  Urine culture pending.  Treatment with Macrobid.  Drink plenty of water.  Patient advised that if there is any changes needed treatment she will be notified via phone.  Final Clinical Impressions(s) / UC Diagnoses   Final diagnoses:  Acute cystitis without hematuria   Discharge Instructions   None    ED Prescriptions    Medication Sig Dispense Auth. Provider   nitrofurantoin, macrocrystal-monohydrate, (MACROBID) 100 MG capsule Take 1 capsule (100 mg total) by mouth 2 (two) times daily for 7 days. 14 capsule Bing Neighbors, FNP   ibuprofen (ADVIL) 600 MG tablet Take 1 tablet (600 mg total) by mouth every 6 (six) hours as needed. 30 tablet Bing Neighbors, FNP     PDMP not reviewed this encounter.   Bing Neighbors, [place] 10/07/20 [phone]

## 2020-10-09 LAB — URINE CULTURE

## 2020-11-10 ENCOUNTER — Telehealth: Payer: Self-pay | Admitting: Family Medicine

## 2020-11-10 NOTE — Telephone Encounter (Signed)
Patient went to Urgent Care on 05/24 and was put on an antibiotic and now she is having itchiness around the vulva area and discharge with an odor.  She would like to get a refill on fluconazole (DIFLUCAN) 150 MG tablet   [id number] [place], [place] - [address] Phone:  [phone]  Fax:  [phone]

## 2020-11-11 NOTE — Telephone Encounter (Signed)
Spoke with patient and an appointment scheduled 

## 2020-11-12 ENCOUNTER — Encounter: Payer: Self-pay | Admitting: Family Medicine

## 2020-11-12 ENCOUNTER — Ambulatory Visit (INDEPENDENT_AMBULATORY_CARE_PROVIDER_SITE_OTHER): Payer: Medicaid Other | Admitting: Family Medicine

## 2020-11-12 ENCOUNTER — Other Ambulatory Visit: Payer: Self-pay | Admitting: Family Medicine

## 2020-11-12 ENCOUNTER — Other Ambulatory Visit (HOSPITAL_COMMUNITY)
Admission: RE | Admit: 2020-11-12 | Discharge: 2020-11-12 | Disposition: A | Discharge status: Home / Self Care | Payer: Medicaid Other | Source: Ambulatory Visit | Attending: Family Medicine | Admitting: Family Medicine

## 2020-11-12 ENCOUNTER — Other Ambulatory Visit: Payer: Self-pay

## 2020-11-12 VITALS — BP 120/70 | HR 107 | Temp 99.4°F | Wt 222.2 lb

## 2020-11-12 DIAGNOSIS — N898 Other specified noninflammatory disorders of vagina: Secondary | ICD-10-CM | POA: Insufficient documentation

## 2020-11-12 DIAGNOSIS — R252 Cramp and spasm: Secondary | ICD-10-CM | POA: Diagnosis not present

## 2020-11-12 MED ORDER — FLUCONAZOLE 100 MG PO TABS: ORAL_TABLET | ORAL | 0 refills | Status: DC

## 2020-11-12 NOTE — Progress Notes (Signed)
Established Patient Office Visit  Subjective:  Patient ID: Diana Roman, female    DOB: 1985/08/24  Age: 35 y.o. MRN: [id number]  CC:  Chief Complaint  Patient presents with   Vaginal Itching    Itching around the vulva, no urinary symptoms, no discharge, would also like labs. Pt states that since starting labetalol she has been having bad leg cramps    HPI Diana Roman presents for the following issues  She states she had frequent leg cramps involving her right calf predominantly for several months now.  She started labetalol for hypertension and it seemed to correlate with that.  Does not take any diuretics.  She is requesting lab work.  She feels like she is staying well-hydrated.  Denies any recent nausea, vomiting, or recurrent diarrhea.  Other issue is some pruritus around the vagina especially left vulvar region.  She states her vaginal discharge is about like usual.  She did take some fluconazole with 1 dose recently and this seemed to help.  She has not had a typical discharge she is seen with Candida in the past.  No recent antibiotic use.  No history of diabetes.  No foul odor.  She has 1 partner.  No barrier protection.  No dysuria.    Past Medical History:  Diagnosis Date   Abnormal Pap smear 2011   Anxiety    Bipolar 1 disorder (HCC)    Depression    on meds, doing well   H/O bacterial infection    H/O varicella    Headache(784.0)    Frequent   Hypertension    Infection    UTI   Migraine    No pertinent past medical history    Sickle cell trait (HCC)    Vaginal Pap smear, abnormal    repeat was normal   Yeast infection     Past Surgical History:  Procedure Laterality Date   DENTAL SURGERY     NO PAST SURGERIES      Family History  Problem Relation Age of Onset   Hypertension Mother    Diabetes Maternal Grandmother    Glaucoma Maternal Grandmother    Hypertension Sister     Social History   Socioeconomic History   Marital status: Divorced     Spouse name: Not on file   Number of children: Not on file   Years of education: Not on file   Highest education level: Not on file  Occupational History   Not on file  Tobacco Use   Smoking status: Never   Smokeless tobacco: Never  Vaping Use   Vaping Use: Never used  Substance and Sexual Activity   Alcohol use: Yes    Comment: 2/wk   Drug use: No   Sexual activity: Yes    Birth control/protection: None  Other Topics Concern   Not on file  Social History Narrative   Not on file   Social Determinants of Health   Financial Resource Strain: Not on file  Food Insecurity: Not on file  Transportation Needs: Not on file  Physical Activity: Not on file  Stress: Not on file  Social Connections: Not on file  Intimate Partner Violence: Not on file    Outpatient Medications Prior to Visit  Medication Sig Dispense Refill   ibuprofen (ADVIL) 600 MG tablet Take 1 tablet (600 mg total) by mouth every 6 (six) hours as needed. 30 tablet 0   labetalol (NORMODYNE) 100 MG tablet Take 1 tablet (100 mg  total) by mouth 2 (two) times daily. 60 tablet 2   lamoTRIgine (LAMICTAL) 25 MG tablet Take 25 mg by mouth daily.     LATUDA 20 MG TABS tablet TAKE 1 TABLET BY MOUTH DAILY WITH BREAKFAST 30 tablet 2   ondansetron (ZOFRAN ODT) 4 MG disintegrating tablet Take 1 tablet (4 mg total) by mouth every 8 (eight) hours as needed for nausea or vomiting. 12 tablet 0   Vitamin D, Ergocalciferol, (DRISDOL) 1.25 MG (50000 UNIT) CAPS capsule Take 1 capsule (50,000 Units total) by mouth every 7 (seven) days. 4 capsule 0   No facility-administered medications prior to visit.    No Known Allergies  ROS Review of Systems  Constitutional:  Negative for chills and fever.  Respiratory:  Negative for shortness of breath.   Cardiovascular:  Negative for leg swelling.  Gastrointestinal:  Negative for abdominal pain.  Genitourinary:  Negative for difficulty urinating, dysuria, vaginal bleeding and vaginal pain.      Objective:    Physical Exam Cardiovascular:     Rate and Rhythm: Normal rate and regular rhythm.  Genitourinary:    Comments: Visual pelvic exam with nurse present.  She has normal external genitalia.  She has some thin whitish mucoid discharge.  No significant erythema. Musculoskeletal:     Right lower leg: No edema.     Left lower leg: No edema.    BP 120/70 (BP Location: Left Arm, Patient Position: Sitting, Cuff Size: Normal)   Pulse (!) 107   Temp 99.4 F (37.4 C) (Oral)   Wt 222 lb 3.2 oz (100.8 kg)   SpO2 96%   BMI 35.86 kg/m  Wt Readings from Last 3 Encounters:  11/12/20 222 lb 3.2 oz (100.8 kg)  07/04/20 223 lb 1.6 oz (101.2 kg)  02/01/20 229 lb (103.9 kg)     Health Maintenance Due  Topic Date Due   COVID-19 Vaccine (1) Never done   Pneumococcal Vaccine 65-34 Years old (1 - PCV) Never done   HIV Screening  Never done    There are no preventive care reminders to display for this patient.  Lab Results  Component Value Date   TSH 1.00 07/04/2020   Lab Results  Component Value Date   WBC 5.0 07/04/2020   HGB 10.5 (L) 07/04/2020   HCT 32.1 (L) 07/04/2020   MCV 82.9 07/04/2020   PLT 279 07/04/2020   Lab Results  Component Value Date   NA 139 07/04/2020   K 4.0 07/04/2020   CO2 25 07/04/2020   GLUCOSE 87 07/04/2020   BUN 14 07/04/2020   CREATININE 0.81 07/04/2020   BILITOT 0.6 07/04/2020   ALKPHOS 45 11/29/2018   AST 15 07/04/2020   ALT 14 07/04/2020   PROT 6.8 07/04/2020   ALBUMIN 4.1 11/29/2018   CALCIUM 8.9 07/04/2020   ANIONGAP 6 09/29/2018   GFR 92.38 11/29/2018   Lab Results  Component Value Date   CHOL 177 07/04/2020   Lab Results  Component Value Date   HDL 52 07/04/2020   Lab Results  Component Value Date   LDLCALC 111 (H) 07/04/2020   Lab Results  Component Value Date   TRIG 54 07/04/2020   Lab Results  Component Value Date   CHOLHDL 3.4 07/04/2020   Lab Results  Component Value Date   HGBA1C 5.5 07/04/2020       Assessment & Plan:   Problem List Items Addressed This Visit   None Visit Diagnoses     Muscle cramps    -  Primary   Relevant Orders   Basic metabolic panel   Magnesium   Vaginal pruritus       Relevant Orders   Urine cytology ancillary only     Check labs with basic metabolic panel and magnesium level -Stressed importance of good hydration -If labs above normal consider multi vitamin supplement with vitamin E and B6 and B12 -Discussed trial of fluconazole 100 mg tablet daily for 5 days pending results above.  We will send urine cytology to screen for Candida, GC, chlamydia, trichomoniasis  Meds ordered this encounter  Medications   fluconazole (DIFLUCAN) 100 MG tablet    Sig: Take one tablet by mouth once daily for 5 days.    Dispense:  5 tablet    Refill:  0    Follow-up: No follow-ups on file.    Evelena Peat, MD

## 2020-11-13 LAB — BASIC METABOLIC PANEL
BUN: 15 mg/dL (ref 6–23)
CO2: 22 mEq/L (ref 19–32)
Calcium: 8.9 mg/dL (ref 8.4–10.5)
Chloride: 103 mEq/L (ref 96–112)
Creatinine, Ser: 0.99 mg/dL (ref 0.40–1.20)
GFR: 74.56 mL/min (ref >60.00)
Glucose, Bld: 93 mg/dL (ref 70–99)
Potassium: 3.4 mEq/L — ABNORMAL LOW (ref 3.5–5.1)
Sodium: 135 mEq/L (ref 135–145)

## 2020-11-13 LAB — MAGNESIUM: Magnesium: 1.9 mg/dL (ref 1.5–2.5)

## 2020-11-14 LAB — URINE CYTOLOGY ANCILLARY ONLY
Candida Glabrata: NEGATIVE
Candida Vaginitis: NEGATIVE
Chlamydia: NEGATIVE
Comment: NEGATIVE
Comment: NEGATIVE
Comment: NEGATIVE
Comment: NEGATIVE
Comment: NORMAL
Neisseria Gonorrhea: NEGATIVE
Trichomonas: NEGATIVE

## 2020-11-16 MED ORDER — ONDANSETRON 4 MG PO TBDP: 4.0000 mg | ORAL_TABLET | Freq: Three times a day (TID) | ORAL | 0 refills | Status: DC | PRN

## 2020-12-24 ENCOUNTER — Encounter: Payer: Self-pay | Admitting: Family Medicine

## 2021-04-08 ENCOUNTER — Other Ambulatory Visit: Payer: Self-pay

## 2021-04-08 ENCOUNTER — Ambulatory Visit: Payer: Medicaid Other | Attending: Family Medicine

## 2021-04-08 DIAGNOSIS — M545 Low back pain, unspecified: Secondary | ICD-10-CM | POA: Insufficient documentation

## 2021-04-08 DIAGNOSIS — M6281 Muscle weakness (generalized): Secondary | ICD-10-CM | POA: Insufficient documentation

## 2021-04-08 DIAGNOSIS — R2689 Other abnormalities of gait and mobility: Secondary | ICD-10-CM | POA: Insufficient documentation

## 2021-04-08 NOTE — Therapy (Signed)
[facility] Out[facility] [address] [place], [place], [address] Phone: [phone]   Fax:  [phone]  Physical Therapy Evaluation  Patient Details  Name: Diana Roman MRN: [id number] Date of Birth: Mar 07, 1986 Referring Provider (PT): Lanell Persons, MD   Encounter Date: 04/08/2021   PT End of Session - 04/08/21 1512     Visit Number 1    Number of Visits 17    Date for PT Re-Evaluation 06/03/21    Authorization Type [employer] and [employer]    PT Start Time 1435    PT Stop Time 1510    PT Time Calculation (min) 35 min    Activity Tolerance Patient limited by pain             Past Medical History:  Diagnosis Date   Abnormal Pap smear 2011   Anxiety    Bipolar 1 disorder (HCC)    Depression    on meds, doing well   H/O bacterial infection    H/O varicella    Headache(784.0)    Frequent   Hypertension    Infection    UTI   Migraine    No pertinent past medical history    Sickle cell trait (HCC)    Vaginal Pap smear, abnormal    repeat was normal   Yeast infection     Past Surgical History:  Procedure Laterality Date   DENTAL SURGERY     NO PAST SURGERIES      There were no vitals filed for this visit.    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 Notes that while pain has slowly been getting better, but she does have increased lower back pain, especially with changing and hygiene with pt's at [facility]. Denies symptoms of paresthesias or pain referral into either LE. Also denies b/b changes or saddle anesthesia.    Pertinent History roughly 4 weeks hx of acute R sided lumbar pain after transfering pt in hospital    Limitations Sitting;Standing;Walking    How long can you sit comfortably? 20-30 minutes    How long can you stand comfortably? 15  minutes    How long can you walk comfortably? 60 minutes    Patient Stated Goals decrease LBP in order to get off light duty with work    Currently in Pain? Yes    Pain Score 7    10/10 at worst   Pain Location Back    Pain Orientation Lower    Pain Descriptors / Indicators Aching    Pain Type Acute pain    Pain Onset More than a month ago    Pain Frequency Intermittent    Aggravating Factors  twisting, fwd flexion    Pain Relieving Factors ice, pillows that help with positioning                [facility] PT Assessment - 04/08/21 0001       Assessment   Medical Diagnosis sprain,lumbar sprain    Referring Provider (PT) Lanell Persons, MD    Onset Date/Surgical Date 03/05/21      Precautions   Precautions None      Restrictions   Weight Bearing Restrictions No      Balance Screen   Has the patient fallen in the past 6 months No    Has  the patient had a decrease in activity level because of a fear of falling?  No    Is the patient reluctant to leave their home because of a fear of falling?  No      Home Environment   Living Environment Private residence    Type of Home Apartment    Home Access Stairs to enter    Home Equipment None    Additional Comments no barriers with home      Prior Function   Level of Independence Independent;Independent with basic ADLs    Vocation Full time employment      Observation/Other Assessments   Focus on Therapeutic Outcomes (FOTO)  no FOTO - MCd    Other Surveys  Oswestry Disability Index    Oswestry Disability Index  54% disability      Functional Tests   Functional tests Sit to Stand;Other      Sit to Stand   Comments 30" STS: 2 reps - increased pain      Other:   Other/ Comments sharp increase in pain with standing repeated flex x 5; also increased pain with repeated ext      Posture/Postural Control   Posture Comments increased body habitus, slumped sitting posture      ROM / Strength   AROM / PROM / Strength Strength       Strength   Overall Strength Comments did not assess formal MMT due to sharp increases in pain      Palpation   Palpation comment sharp TTP to R sided lumbar paraspinals      Ambulation/Gait   Gait Comments antalgic gait towards R                        Objective measurements completed on examination: See above findings.                PT Education - 04/08/21 1511     Education Details eval findings, HEP, POC    Person(s) Educated Patient    Methods Explanation;Demonstration;Handout    Comprehension Verbalized understanding;Returned demonstration              PT Short Term Goals - 04/08/21 1516       PT SHORT TERM GOAL #1   Title pt will be compliant and knowledgeable with initial HEP for improved comfort and carryover    Baseline initial HEP given    Time 3    Period Weeks    Status New    Target Date 04/29/21               PT Long Term Goals - 04/08/21 1517       PT LONG TERM GOAL #1   Title Pt will decrease ODI disability score to no greater than 40% as proxy for functional improvement    Baseline 54% disability    Time 8    Period Weeks    Status New    Target Date 06/03/21      PT LONG TERM GOAL #2   Title Pt will increase reps in 30" STS to no less than 8 for improved functional mobility    Baseline 2 reps    Time 8    Period Weeks    Status New    Target Date 06/03/21      PT LONG TERM GOAL #3   Title Pt will self report LBP no greater than 3/10 at worst for improved comfort and  function    Baseline 10/10 at worst    Time 8    Period Weeks    Status New    Target Date 06/03/21      PT LONG TERM GOAL #4   Title Pt will be able to transfer patient's independently with no increase in LBP 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 Physical findings are consistent with MD impression and injury timeline, as pt has significant pain with palpation and movement apprehension. Overall exam today limited due to sharp increases in pain and pt becoming tearful after sit>supine. Her ODI score indicates severe disability in the performance of ADLs and desired activities, indicating she is operating well below her PLOF. Likewise, her 30" STS is well below norms for age and gender, showing increased fall risk and reduced functional mobility. She would therefore benefit from skilled PT services in order to decrease pain and improve comfort for desired return to full duty at work.    Personal Factors and Comorbidities Comorbidity 3+    Comorbidities PMH: HTN, Bipolar, Sickle cell    Examination-Activity Limitations Bathing;Locomotion Level;Transfers;Sit;Squat;Stairs;Lift;Carry;Stand;Caring for Others    Examination-Participation Restrictions Occupation;Community Activity;Other   Dancing   Stability/Clinical Decision Making Stable/Uncomplicated    Clinical Decision Making Moderate    Rehab Potential Good    PT Frequency 2x / week    PT Duration 8 weeks    PT Treatment/Interventions ADLs/Self Care Home Management;Aquatic Therapy;Electrical Stimulation;Cryotherapy;Moist Heat;Gait training;Stair training;Functional mobility training;Therapeutic activities;Therapeutic exercise;Balance training;Neuromuscular re-education;Patient/family education;Manual techniques;Passive range of motion;Dry needling;Taping;Vasopneumatic Device;Spinal Manipulations;Joint Manipulations    PT Next Visit Plan assess response to HEP; neutral spine strengthening    PT Home Exercise Plan Access Code: [email or web address]    Consulted and Agree with Plan of Care Patient             Patient will benefit from skilled therapeutic intervention in order to improve the  following deficits and impairments:  Abnormal gait, Decreased activity tolerance, Decreased balance, Decreased endurance, Decreased mobility, Decreased range of motion, Decreased strength, Difficulty walking, Pain  Visit Diagnosis: Acute right-sided low back pain without sciatica - Plan: PT plan of care cert/re-cert  Muscle weakness (generalized) - Plan: PT plan of care cert/re-cert  Other abnormalities of gait and mobility - Plan: PT plan of care cert/re-cert     Problem List Patient Active Problem List   Diagnosis Date Noted   Hypertension 10/25/2018   Bipolar 1 disorder, depressed, severe (HCC) 03/16/2018   Suicide attempt (HCC)     Eloy End, PT 04/08/2021, 7:14 PM  [facility] Outpatient Rehabilitation [facility] [address] [place], [place], [address] Phone: [phone]   Fax:  [phone]  Name: Diana Roman MRN: [id number] Date of Birth: 13-Sep-1985

## 2021-04-13 ENCOUNTER — Telehealth: Payer: Self-pay

## 2021-04-13 NOTE — Telephone Encounter (Signed)
Called about missed appointment. No answer, left voicemail. Reminded of attendance policy and next appointment.

## 2021-04-20 ENCOUNTER — Telehealth: Payer: Self-pay | Admitting: Family Medicine

## 2021-04-20 NOTE — Telephone Encounter (Signed)
Patient is requesting a call back.  She is requesting a prescription being called in for itching around vulva area.  She is requesting Diflucan.  Patient has an appointment on Friday with provider.  Pharmacy-- [facility] on corner of [address] and [address]

## 2021-04-20 NOTE — Telephone Encounter (Signed)
Spoke with the patient and informed her a visit is needed for evaluation.  Appt scheduled for 12/6 with Dr Ardyth Harps as PCP does not have any openings.

## 2021-04-21 ENCOUNTER — Ambulatory Visit (INDEPENDENT_AMBULATORY_CARE_PROVIDER_SITE_OTHER): Payer: Medicaid Other | Admitting: Internal Medicine

## 2021-04-21 ENCOUNTER — Encounter: Payer: Self-pay | Admitting: Internal Medicine

## 2021-04-21 ENCOUNTER — Other Ambulatory Visit (HOSPITAL_COMMUNITY)
Admission: RE | Admit: 2021-04-21 | Discharge: 2021-04-21 | Disposition: A | Discharge status: Home / Self Care | Payer: Medicaid Other | Source: Ambulatory Visit | Attending: Internal Medicine | Admitting: Internal Medicine

## 2021-04-21 VITALS — BP 120/84 | HR 88 | Temp 98.1°F | Wt 225.4 lb

## 2021-04-21 DIAGNOSIS — B3731 Acute candidiasis of vulva and vagina: Secondary | ICD-10-CM

## 2021-04-21 DIAGNOSIS — N898 Other specified noninflammatory disorders of vagina: Secondary | ICD-10-CM | POA: Diagnosis present

## 2021-04-21 MED ORDER — FLUCONAZOLE 150 MG PO TABS: 150.0000 mg | ORAL_TABLET | Freq: Every day | ORAL | 0 refills | Status: AC

## 2021-04-21 NOTE — Progress Notes (Signed)
Acute office Visit     This visit occurred during the SARS-CoV-2 public health emergency.  Safety protocols were in place, including screening questions prior to the visit, additional usage of staff PPE, and extensive cleaning of exam room while observing appropriate contact time as indicated for disinfecting solutions.    CC/Reason for Visit: Vaginal itching  HPI: Diana Roman is a 35 y.o. female who is coming in today for the above mentioned reasons.  She has been experiencing vaginal itching for the past few days.  She has not noted any discharge, no new sexual partners.  Past Medical/Surgical History: Past Medical History:  Diagnosis Date   Abnormal Pap smear 2011   Anxiety    Bipolar 1 disorder (HCC)    Depression    on meds, doing well   H/O bacterial infection    H/O varicella    Headache(784.0)    Frequent   Hypertension    Infection    UTI   Migraine    No pertinent past medical history    Sickle cell trait (HCC)    Vaginal Pap smear, abnormal    repeat was normal   Yeast infection     Past Surgical History:  Procedure Laterality Date   DENTAL SURGERY     NO PAST SURGERIES      Social History:  reports that she has never smoked. She has never used smokeless tobacco. She reports current alcohol use. She reports that she does not use drugs.  Allergies: No Known Allergies  Family History:  Family History  Problem Relation Age of Onset   Hypertension Mother    Diabetes Maternal Grandmother    Glaucoma Maternal Grandmother    Hypertension Sister      Current Outpatient Medications:    fluconazole (DIFLUCAN) 150 MG tablet, Take 1 tablet (150 mg total) by mouth daily for 3 days., Disp: 3 tablet, Rfl: 0   ibuprofen (ADVIL) 600 MG tablet, Take 1 tablet (600 mg total) by mouth every 6 (six) hours as needed., Disp: 30 tablet, Rfl: 0   labetalol (NORMODYNE) 100 MG tablet, Take 1 tablet (100 mg total) by mouth 2 (two) times daily., Disp: 60 tablet,  Rfl: 2   lamoTRIgine (LAMICTAL) 25 MG tablet, Take 25 mg by mouth daily., Disp: , Rfl:    LATUDA 20 MG TABS tablet, TAKE 1 TABLET BY MOUTH DAILY WITH BREAKFAST, Disp: 30 tablet, Rfl: 2   ondansetron (ZOFRAN ODT) 4 MG disintegrating tablet, Take 1 tablet (4 mg total) by mouth every 8 (eight) hours as needed for nausea or vomiting., Disp: 30 tablet, Rfl: 0   Vitamin D, Ergocalciferol, (DRISDOL) 1.25 MG (50000 UNIT) CAPS capsule, Take 1 capsule (50,000 Units total) by mouth every 7 (seven) days., Disp: 4 capsule, Rfl: 0  Review of Systems:  Constitutional: Denies fever, chills, diaphoresis, appetite change and fatigue.  HEENT: Denies photophobia, eye pain, redness, hearing loss, ear pain, congestion, sore throat, rhinorrhea, sneezing, mouth sores, trouble swallowing, neck pain, neck stiffness and tinnitus.   Respiratory: Denies SOB, DOE, cough, chest tightness,  and wheezing.   Cardiovascular: Denies chest pain, palpitations and leg swelling.  Gastrointestinal: Denies nausea, vomiting, abdominal pain, diarrhea, constipation, blood in stool and abdominal distention.  Genitourinary: Denies dysuria, urgency, frequency, hematuria, flank pain and difficulty urinating.  Endocrine: Denies: hot or cold intolerance, sweats, changes in hair or nails, polyuria, polydipsia. Musculoskeletal: Denies myalgias, back pain, joint swelling, arthralgias and gait problem.  Skin: Denies pallor, rash  and wound.  Neurological: Denies dizziness, seizures, syncope, weakness, light-headedness, numbness and headaches.  Hematological: Denies adenopathy. Easy bruising, personal or family bleeding history  Psychiatric/Behavioral: Denies suicidal ideation, mood changes, confusion, nervousness, sleep disturbance and agitation    Physical Exam: Vitals:   04/21/21 0901  BP: 120/84  Pulse: 88  Temp: 98.1 F (36.7 C)  TempSrc: Oral  SpO2: 98%  Weight: 225 lb 6.4 oz (102.2 kg)    Body mass index is 36.38  kg/m.   Constitutional: NAD, calm, comfortable Eyes: PERRL, lids and conjunctivae normal ENMT: Mucous membranes are moist.  Genitourinary: Inflamed, erythematous vaginal introitus.  Upon insertion of speculum, copious amounts of thick white discharge were noted. Neurologic: Grossly intact and nonfocal Psychiatric: Normal judgment and insight. Alert and oriented x 3. Normal mood.    Impression and Plan:  Vaginal yeast infection - Plan: fluconazole (DIFLUCAN) 150 MG tablet  Vaginal itching - Plan: Cervicovaginal ancillary only  -Wet prep performed today, there is significant evidence that this is a yeast infection, will send Diflucan 150 mg tablets daily x3 days.  Wet prep will be sent.  Time spent: 30 minutes reviewing chart, interviewing and examining patient, formulating plan of care.   Patient Instructions  -Nice seeing you today!!  -Take fluconazole 150 mg daily for 3 days.      Lelon Frohlich, MD  Primary Care at [facility]

## 2021-04-21 NOTE — Patient Instructions (Signed)
-  Nice seeing you today!!  -Take fluconazole 150 mg daily for 3 days.

## 2021-04-22 ENCOUNTER — Ambulatory Visit: Payer: Medicaid Other | Attending: Family Medicine

## 2021-04-22 ENCOUNTER — Other Ambulatory Visit: Payer: Self-pay

## 2021-04-22 DIAGNOSIS — M6281 Muscle weakness (generalized): Secondary | ICD-10-CM | POA: Diagnosis present

## 2021-04-22 DIAGNOSIS — R2689 Other abnormalities of gait and mobility: Secondary | ICD-10-CM | POA: Diagnosis present

## 2021-04-22 DIAGNOSIS — M545 Low back pain, unspecified: Secondary | ICD-10-CM | POA: Insufficient documentation

## 2021-04-22 LAB — CERVICOVAGINAL ANCILLARY ONLY
Bacterial Vaginitis (gardnerella): POSITIVE — AB
Candida Glabrata: NEGATIVE
Candida Vaginitis: POSITIVE — AB
Chlamydia: NEGATIVE
Comment: NEGATIVE
Comment: NEGATIVE
Comment: NEGATIVE
Comment: NEGATIVE
Comment: NEGATIVE
Comment: NORMAL
Neisseria Gonorrhea: NEGATIVE
Trichomonas: NEGATIVE

## 2021-04-22 NOTE — Therapy (Signed)
[facility] Outpatient Rehabilitation [facility] [address] [place], [place], [address] Phone: [phone]   Fax:  [phone]  Physical Therapy Treatment  Patient Details  Name: Diana Roman MRN: [id number] Date of Birth: Jul 05, 1985 Referring Provider (PT): Lanell Persons, MD   Encounter Date: 04/22/2021   PT End of Session - 04/22/21 1405     Visit Number 2    Number of Visits 17    Date for PT Re-Evaluation 06/03/21    Authorization Type [employer]    PT Start Time 1405    PT Stop Time 1444    PT Time Calculation (min) 39 min    Equipment Utilized During Treatment Other (comment)   tennis ball   Activity Tolerance Patient limited by pain             Past Medical History:  Diagnosis Date   Abnormal Pap smear 2011   Anxiety    Bipolar 1 disorder (HCC)    Depression    on meds, doing well   H/O bacterial infection    H/O varicella    Headache(784.0)    Frequent   Hypertension    Infection    UTI   Migraine    No pertinent past medical history    Sickle cell trait (HCC)    Vaginal Pap smear, abnormal    repeat was normal   Yeast infection     Past Surgical History:  Procedure Laterality Date   DENTAL SURGERY     NO PAST SURGERIES      There were no vitals filed for this visit.   Subjective Assessment - 04/22/21 1405     Subjective Pt presents to PT with reports of continued LBP and discomfort, roughly 5/10. Has been somewhat compliant with her HEP, does have some discomfort. Pt is ready to begin PT treatment at this time.    Currently in Pain? Yes    Pain Score 5     Pain Location Back    Pain Orientation Lower           [facility] Adult PT Treatment/Exercise:   Therapeutic Exercise:  NuStep lvl 5 UE/LE x 4 min R hip extension stretch on 8in step 2x30" Fwd physioball rollout x 10 5" [place]yling SKTC R x 5 - 10"   Manual Therapy: STM to R sided lumbar paraspinals Trigger point release to R QL   Neuromuscular  re-ed: N/A   Therapeutic Activity: N/A   Modalities: N/A   Self Care: N/A   Consider / progression for next session:                              PT Education - 04/22/21 1451     Education Details HEP update and trigger point release using tennis ball    Person(s) Educated Patient    Methods Explanation;Demonstration;Handout    Comprehension Verbalized understanding;Returned demonstration              PT Short Term Goals - 04/08/21 1516       PT SHORT TERM GOAL #1   Title pt will be compliant and knowledgeable with initial HEP for improved comfort and carryover    Baseline initial HEP given    Time 3    Period Weeks    Status New    Target Date 04/29/21               PT Long Term Goals -  04/08/21 1517       PT LONG TERM GOAL #1   Title Pt will decrease ODI disability score to no greater than 40% as proxy for functional improvement    Baseline 54% disability    Time 8    Period Weeks    Status New    Target Date 06/03/21      PT LONG TERM GOAL #2   Title Pt will increase reps in 30" STS to no less than 8 for improved functional mobility    Baseline 2 reps    Time 8    Period Weeks    Status New    Target Date 06/03/21      PT LONG TERM GOAL #3   Title Pt will self report LBP no greater than 3/10 at worst for improved comfort and function    Baseline 10/10 at worst    Time 8    Period Weeks    Status New    Target Date 06/03/21      PT LONG TERM GOAL #4   Title Pt will be able to transfer patient's independently with no increase in 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 She noted slight improvement in symptoms and tightness post manual therapy  interventions and demonstrated flexion bias, with decreased pain noted during isolated hip and lumbar flexion. She continues to benefit from skilled PT services and should continue to be seen and progressed as tolerated.    PT Treatment/Interventions ADLs/Self Care Home Management;Aquatic Therapy;Electrical Stimulation;Cryotherapy;Moist Heat;Gait training;Stair training;Functional mobility training;Therapeutic activities;Therapeutic exercise;Balance training;Neuromuscular re-education;Patient/family education;Manual techniques;Passive range of motion;Dry needling;Taping;Vasopneumatic Device;Spinal Manipulations;Joint Manipulations    PT Next Visit Plan assess response to HEP; neutral spine strengthening    PT Home Exercise Plan Access Code: [email or web address]             Patient will benefit from skilled therapeutic intervention in order to improve the following deficits and impairments:  Abnormal gait, Decreased activity tolerance, Decreased balance, Decreased endurance, Decreased mobility, Decreased range of motion, Decreased strength, Difficulty walking, Pain  Visit Diagnosis: Acute right-sided low back pain without sciatica  Muscle weakness (generalized)  Other abnormalities of gait and mobility     Problem List Patient Active Problem List   Diagnosis Date Noted   Hypertension 10/25/2018   Bipolar 1 disorder, depressed, severe (HCC) 03/16/2018   Suicide attempt (HCC)     Eloy End, PT 04/22/2021, 2:51 PM  [facility] Outpatient Rehabilitation [facility] [address] [place], [place], [address] Phone: [phone]   Fax:  [phone]  Name: Diana Roman MRN: [id number] Date of Birth: 05/11/1986

## 2021-04-23 ENCOUNTER — Other Ambulatory Visit: Payer: Self-pay

## 2021-04-24 ENCOUNTER — Ambulatory Visit: Payer: Medicaid Other | Admitting: Family Medicine

## 2021-04-24 ENCOUNTER — Telehealth: Payer: Self-pay | Admitting: Family Medicine

## 2021-04-24 NOTE — Telephone Encounter (Signed)
Patient called because she stated she was supposed to have a second prescription sent in but it was never sent. Patient states she spoke with [facility] and they told her they were sending it in as she has bacterial vaginosis as well. I asked Fleet Contras and Fleet Contras confirmed that patient did have Bacterial vaginosis, but the Diflucan will treat both. I let patient know this and she verbalized understanding.

## 2021-04-28 ENCOUNTER — Other Ambulatory Visit: Payer: Self-pay | Admitting: Family Medicine

## 2021-05-04 ENCOUNTER — Ambulatory Visit: Payer: Medicaid Other

## 2021-05-04 ENCOUNTER — Other Ambulatory Visit: Payer: Self-pay

## 2021-05-04 DIAGNOSIS — M545 Low back pain, unspecified: Secondary | ICD-10-CM

## 2021-05-04 DIAGNOSIS — R2689 Other abnormalities of gait and mobility: Secondary | ICD-10-CM

## 2021-05-04 DIAGNOSIS — M6281 Muscle weakness (generalized): Secondary | ICD-10-CM

## 2021-05-04 NOTE — Therapy (Signed)
[facility] [address] [place], [place], [address] Phone: [phone]   Fax:  [phone]  Physical Therapy Treatment  Patient Details  Name: Diana Roman MRN: [id number] Date of Birth: 12-19-1985 Referring Provider (PT): Lanell Persons, MD   Encounter Date: 05/04/2021   PT End of Session - 05/04/21 1454     Visit Number 3    Number of Visits 17    Date for PT Re-Evaluation 06/03/21    Authorization Type [employer]    PT Start Time 1452   arrived late   Equipment Utilized During Treatment Other (comment)   tennis ball   Activity Tolerance Patient limited by pain             Past Medical History:  Diagnosis Date   Abnormal Pap smear 2011   Anxiety    Bipolar 1 disorder (HCC)    Depression    on meds, doing well   H/O bacterial infection    H/O varicella    Headache(784.0)    Frequent   Hypertension    Infection    UTI   Migraine    No pertinent past medical history    Sickle cell trait (HCC)    Vaginal Pap smear, abnormal    repeat was normal   Yeast infection     Past Surgical History:  Procedure Laterality Date   DENTAL SURGERY     NO PAST SURGERIES      There were no vitals filed for this visit.   Subjective Assessment - 05/04/21 1454     Subjective Pt presents to PT with no current reports of pain. Notes that she had increased pain yesterady, but it reduced with MHP and ice when she got home. She has been compliant with her HEP with no adverse effect. Pt is ready to begin PT at this time.    Currently in Pain? No/denies    Pain Score 0-No pain           [facility] Adult PT Treatment/Exercise:   Therapeutic Exercise:  NuStep lvl 5 UE/LE x 3 min  STS 2x8 - no UE support Fwd physioball rollout x 10 - 5" Bridge 2x10 - 3" Supine clamshell 2x10 GTB [place]yl[place] SKTC R x 5 - 10"    Modalities: Ice pack to lumbar spine during supine  exercises                             PT Education - 05/04/21 1522     Education Details HEP update    Person(s) Educated Patient    Methods Explanation;Demonstration;Handout    Comprehension Verbalized understanding;Returned demonstration              PT Short Term Goals - 04/08/21 1516       PT SHORT TERM GOAL #1   Title pt will be compliant and knowledgeable with initial HEP for improved comfort and carryover    Baseline initial HEP given    Time 3    Period Weeks    Status New    Target Date 04/29/21               PT Long Term Goals - 04/08/21 1517       PT LONG TERM GOAL #1   Title Pt will decrease ODI disability score to no greater than 40% as proxy for functional improvement    Baseline 54% disability  Time 8    Period Weeks    Status New    Target Date 06/03/21      PT LONG TERM GOAL #2   Title Pt will increase reps in 30" STS to no less than 8 for improved functional mobility    Baseline 2 reps    Time 8    Period Weeks    Status New    Target Date 06/03/21      PT LONG TERM GOAL #3   Title Pt will self report LBP no greater than 3/10 at worst for improved comfort and function    Baseline 10/10 at worst    Time 8    Period Weeks    Status New    Target Date 06/03/21      PT LONG TERM GOAL #4   Title 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 She demonstrated improved tolerance to therapy today, with progression of core and proximal hip strenghtening exercises today. She continues to benefit from skilled PT services and should continue to be seen and progressed as tolerated.    PT  Treatment/Interventions ADLs/Self Care Home Management;Aquatic Therapy;Electrical Stimulation;Cryotherapy;Moist Heat;Gait training;Stair training;Functional mobility training;Therapeutic activities;Therapeutic exercise;Balance training;Neuromuscular re-education;Patient/family education;Manual techniques;Passive range of motion;Dry needling;Taping;Vasopneumatic Device;Spinal Manipulations;Joint Manipulations    PT Next Visit Plan assess response to HEP; neutral spine strengthening    PT Home Exercise Plan Access Code: [email or web address]             Patient will benefit from skilled therapeutic intervention in order to improve the following deficits and impairments:  Abnormal gait, Decreased activity tolerance, Decreased balance, Decreased endurance, Decreased mobility, Decreased range of motion, Decreased strength, Difficulty walking, Pain  Visit Diagnosis: Acute right-sided low back pain without sciatica  Muscle weakness (generalized)  Other abnormalities of gait and mobility     Problem List Patient Active Problem List   Diagnosis Date Noted   Hypertension 10/25/2018   Bipolar 1 disorder, depressed, severe (HCC) 03/16/2018   Suicide attempt (HCC)     Eloy End, PT 05/04/2021, 3:39 PM  [facility] [address] [place], [place], [address] Phone: [phone]   Fax:  [phone]  Name: Diana Roman MRN: [id number] Date of Birth: 1986-04-24

## 2021-05-05 ENCOUNTER — Encounter (HOSPITAL_BASED_OUTPATIENT_CLINIC_OR_DEPARTMENT_OTHER): Payer: Self-pay

## 2021-05-05 ENCOUNTER — Other Ambulatory Visit (HOSPITAL_BASED_OUTPATIENT_CLINIC_OR_DEPARTMENT_OTHER): Payer: Self-pay

## 2021-05-05 MED ORDER — ALPRAZOLAM 1 MG PO TABS
ORAL_TABLET | ORAL | 2 refills | Status: DC
  Filled 2021-05-05: qty 30, 30d supply, fill #0

## 2021-05-05 MED ORDER — LATUDA 80 MG PO TABS
80.0000 mg | ORAL_TABLET | Freq: Every evening | ORAL | 12 refills | Status: DC
  Filled 2021-05-05 – 2021-12-17 (×2): qty 30, 30d supply, fill #0

## 2021-05-06 ENCOUNTER — Other Ambulatory Visit (HOSPITAL_BASED_OUTPATIENT_CLINIC_OR_DEPARTMENT_OTHER): Payer: Self-pay

## 2021-05-14 ENCOUNTER — Telehealth: Payer: Self-pay

## 2021-05-14 NOTE — Telephone Encounter (Signed)
--  Caller states she has had a bad migraine for the last 2 days. Yesterday laid down in the dark and took tylenol and helped. Today tylenol and motrin is not helping. Has been nauseas as well.Loud noises and lights bothering her  05/13/2021 9:45:06 PM See HCP within 4 Hours (or PCP triage) Doree Barthel, RN, [place]  Comments User: Lorrene Reid, RN Date/Time Lamount Cohen Time): 05/13/2021 9:38:34 PM BP 160/92 at 4pm, took a labetalol since then  User: Lorrene Reid, RN Date/Time Lamount Cohen Time): 05/13/2021 9:40:23 PM able to move neck but it is stiff  User: Lorrene Reid, RN Date/Time Lamount Cohen Time): 05/13/2021 9:41:02 PM pain is 7/10 now  User: Lorrene Reid, RN Date/Time Lamount Cohen Time): 05/13/2021 9:46:40 PM caller does not want to sit in an ED waiting room for hours, will call PCP in the morning when open  Referrals [facility] - ED  05/14/21 1559: LVM instructions for pt to call office to schedule appt with provider.

## 2021-05-19 ENCOUNTER — Other Ambulatory Visit: Payer: Self-pay

## 2021-05-19 ENCOUNTER — Ambulatory Visit: Payer: Medicaid Other | Attending: Family Medicine

## 2021-05-19 ENCOUNTER — Other Ambulatory Visit (HOSPITAL_BASED_OUTPATIENT_CLINIC_OR_DEPARTMENT_OTHER): Payer: Self-pay

## 2021-05-19 DIAGNOSIS — M6281 Muscle weakness (generalized): Secondary | ICD-10-CM | POA: Insufficient documentation

## 2021-05-19 DIAGNOSIS — R2689 Other abnormalities of gait and mobility: Secondary | ICD-10-CM | POA: Diagnosis present

## 2021-05-19 DIAGNOSIS — M545 Low back pain, unspecified: Secondary | ICD-10-CM | POA: Diagnosis present

## 2021-05-19 NOTE — Therapy (Signed)
[facility] Outpatient Rehabilitation [facility] [address] [place], [place], [address] Phone: [phone]   Fax:  [phone]  Physical Therapy Treatment  Patient Details  Name: Diana Roman MRN: [id number] Date of Birth: 03-20-1986 Referring Provider (PT): Lanell Persons, MD   Encounter Date: 05/19/2021   PT End of Session - 05/19/21 0751     Visit Number 4    Number of Visits 17    Date for PT Re-Evaluation 06/03/21    Authorization Type [employer]    PT Start Time 0752    PT Stop Time 0826    PT Time Calculation (min) 34 min    Equipment Utilized During Treatment Other (comment)   tennis ball   Activity Tolerance Patient limited by pain             Past Medical History:  Diagnosis Date   Abnormal Pap smear 2011   Anxiety    Bipolar 1 disorder (HCC)    Depression    on meds, doing well   H/O bacterial infection    H/O varicella    Headache(784.0)    Frequent   Hypertension    Infection    UTI   Migraine    No pertinent past medical history    Sickle cell trait (HCC)    Vaginal Pap smear, abnormal    repeat was normal   Yeast infection     Past Surgical History:  Procedure Laterality Date   DENTAL SURGERY     NO PAST SURGERIES      There were no vitals filed for this visit.   Subjective Assessment - 05/19/21 0751     Subjective Pt presents to PT with continued reports of improvement in status. She just got off of work, denies back pain at present. Is ready to begin PT treatment at this time.    Currently in Pain? No/denies    Pain Score 0-No pain           [facility] Adult PT Treatment/Exercise:   Therapeutic Exercise:  NuStep lvl 5 UE/LE x 3 min  STS x 10 - no UE support Fwd physioball rollout x 10 - 5" LTR x 10  Bridge 2x10 - 3" Supine PPT x 5 - 5" Supine PPT w/ ball squeeze x 10 - 3" Supine clamshell 2x10 BTB Supine hamstring ball rollout x 10 - red    Modalities: Ice pack to lumbar spine during supine  exercises                               PT Short Term Goals - 04/08/21 1516       PT SHORT TERM GOAL #1   Title pt will be compliant and knowledgeable with initial HEP for improved comfort and carryover    Baseline initial HEP given    Time 3    Period Weeks    Status New    Target Date 04/29/21               PT Long Term Goals - 04/08/21 1517       PT LONG TERM GOAL #1   Title Pt will decrease ODI disability score to no greater than 40% as proxy for functional improvement    Baseline 54% disability    Time 8    Period Weeks    Status New    Target Date 06/03/21      PT LONG  TERM GOAL #2   Title Pt will increase reps in 30" STS to no less than 8 for improved functional mobility    Baseline 2 reps    Time 8    Period Weeks    Status New    Target Date 06/03/21      PT LONG TERM GOAL #3   Title Pt will self report LBP no greater than 3/10 at worst for improved comfort and function    Baseline 10/10 at worst    Time 8    Period Weeks    Status New    Target Date 06/03/21      PT LONG TERM GOAL #4   Title Pt will be able to transfer patient's independently with 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 Therapy today focused on increasing core and proximal hip strength in supine and seated positioning. Also continued to work on lumbar AROM, with pt noting slight discomfort in R side of lower back during supine hip flexion with physionall. She continues to benefit from skilled PT services working on improving strength and functional ability. Will continue to progress as able per POC.    PT Treatment/Interventions ADLs/Self Care Home Management;Aquatic Therapy;Electrical  Stimulation;Cryotherapy;Moist Heat;Gait training;Stair training;Functional mobility training;Therapeutic activities;Therapeutic exercise;Balance training;Neuromuscular re-education;Patient/family education;Manual techniques;Passive range of motion;Dry needling;Taping;Vasopneumatic Device;Spinal Manipulations;Joint Manipulations    PT Next Visit Plan assess response to HEP; neutral spine strengthening    PT Home Exercise Plan Access Code: [email or web address]             Patient will benefit from skilled therapeutic intervention in order to improve the following deficits and impairments:  Abnormal gait, Decreased activity tolerance, Decreased balance, Decreased endurance, Decreased mobility, Decreased range of motion, Decreased strength, Difficulty walking, Pain  Visit Diagnosis: Acute right-sided low back pain without sciatica  Muscle weakness (generalized)  Other abnormalities of gait and mobility     Problem List Patient Active Problem List   Diagnosis Date Noted   Hypertension 10/25/2018   Bipolar 1 disorder, depressed, severe ([place]) 03/16/2018   Suicide attempt ([place])     Ward Chatters, PT 05/19/2021, 8:26 AM  [facility] [address] [place], [place], [address] Phone: [phone]   Fax:  [phone]  Name: ZAMORAH DENO MRN: [id number] Date of Birth: 1985-10-11

## 2021-05-21 ENCOUNTER — Ambulatory Visit: Payer: Medicaid Other

## 2021-05-21 ENCOUNTER — Other Ambulatory Visit: Payer: Self-pay

## 2021-05-21 DIAGNOSIS — R2689 Other abnormalities of gait and mobility: Secondary | ICD-10-CM

## 2021-05-21 DIAGNOSIS — M545 Low back pain, unspecified: Secondary | ICD-10-CM

## 2021-05-21 DIAGNOSIS — M6281 Muscle weakness (generalized): Secondary | ICD-10-CM

## 2021-05-21 NOTE — Therapy (Addendum)
[facility] Outpatient Rehabilitation [facility] [address] [place], [place], [address] Phone: [phone]   Fax:  [phone]  Physical Therapy Treatment/Discharge  Patient Details  Name: Diana Roman MRN: [id number] Date of Birth: 10-02-1985 Referring Provider (PT): Lanell Persons, MD   Encounter Date: 05/21/2021   PT End of Session - 05/21/21 1447     Visit Number 5    Number of Visits 17    Date for PT Re-Evaluation 06/03/21    Authorization Type [employer]    PT Start Time 1447    PT Stop Time 1525    PT Time Calculation (min) 38 min    Equipment Utilized During Treatment Other (comment)   tennis ball   Activity Tolerance Patient limited by pain             Past Medical History:  Diagnosis Date   Abnormal Pap smear 2011   Anxiety    Bipolar 1 disorder (HCC)    Depression    on meds, doing well   H/O bacterial infection    H/O varicella    Headache(784.0)    Frequent   Hypertension    Infection    UTI   Migraine    No pertinent past medical history    Sickle cell trait (HCC)    Vaginal Pap smear, abnormal    repeat was normal   Yeast infection     Past Surgical History:  Procedure Laterality Date   DENTAL SURGERY     NO PAST SURGERIES      There were no vitals filed for this visit.   Subjective Assessment - 05/21/21 1448     Subjective Pt presents to PT with no current reports of lower back pain. Pt has been compliant with HEP with no adverse effect. She is ready to begin PT at this time.    Currently in Pain? No/denies    Pain Score 0-No pain           [facility] Adult PT Treatment/Exercise:   Therapeutic Exercise:  NuStep lvl 6 UE/LE x 4 min while taking subjective STS x 10 - no UE support Fwd physioball rollout x 10 - 5" LTR x 10  Bridge 2x10 - 3" Supine PPT x 10 - 5" Supine PPT w/ march x 20 Supine PPT w/ ball squeeze x 10 - 3" 90/90 table top hold 3x15" Supine clamshell 2x15 BTB   Modalities: Ice pack to  lumbar spine during supine exercises                               PT Short Term Goals - 04/08/21 1516       PT SHORT TERM GOAL #1   Title pt will be compliant and knowledgeable with initial HEP for improved comfort and carryover    Baseline initial HEP given    Time 3    Period Weeks    Status New    Target Date 04/29/21               PT Long Term Goals - 04/08/21 1517       PT LONG TERM GOAL #1   Title Pt will decrease ODI disability score to no greater than 40% as proxy for functional improvement    Baseline 54% disability    Time 8    Period Weeks    Status New    Target Date 06/03/21  PT LONG TERM GOAL #2   Title Pt will increase reps in 30" STS to no less than 8 for improved functional mobility    Baseline 2 reps    Time 8    Period Weeks    Status New    Target Date 06/03/21      PT LONG TERM GOAL #3   Title Pt will self report LBP no greater than 3/10 at worst for improved comfort and function    Baseline 10/10 at worst    Time 8    Period Weeks    Status New    Target Date 06/03/21      PT LONG TERM GOAL #4   Title Pt will be able to transfer patient's independently with 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 Therapy today again focused on improving core and proximal ihp strength in order to improve comfort and functional ability with work related duties. She tolerated progression well today and continues to benefit from skilled P T services. Will continue to progress as tolerated per POC.    PT Treatment/Interventions ADLs/Self Care Home Management;Aquatic Therapy;Electrical Stimulation;Cryotherapy;Moist Heat;Gait training;Stair  training;Functional mobility training;Therapeutic activities;Therapeutic exercise;Balance training;Neuromuscular re-education;Patient/family education;Manual techniques;Passive range of motion;Dry needling;Taping;Vasopneumatic Device;Spinal Manipulations;Joint Manipulations    PT Next Visit Plan assess response to HEP; neutral spine strengthening    PT Home Exercise Plan Access Code: [email or web address]             Patient will benefit from skilled therapeutic intervention in order to improve the following deficits and impairments:  Abnormal gait, Decreased activity tolerance, Decreased balance, Decreased endurance, Decreased mobility, Decreased range of motion, Decreased strength, Difficulty walking, Pain  Visit Diagnosis: Acute right-sided low back pain without sciatica  Muscle weakness (generalized)  Other abnormalities of gait and mobility     Problem List Patient Active Problem List   Diagnosis Date Noted   Hypertension 10/25/2018   Bipolar 1 disorder, depressed, severe (HCC) 03/16/2018   Suicide attempt (HCC)     Eloy End, PT 05/21/2021, 4:18 PM  [facility] [address] [place], [place], [address] Phone: [phone]   Fax:  [phone]  Name: Diana Roman MRN: [id number] Date of Birth: 04/30/1986  PHYSICAL THERAPY DISCHARGE SUMMARY  Visits from Start of Care: 5  Current functional level related to goals / functional outcomes: Unable to assess   Remaining deficits: Unable to assess   Education / Equipment: N/A   Patient agrees to discharge. Patient goals were  did not assess . Patient is being discharged due to not returning since the last visit.

## 2021-05-27 ENCOUNTER — Ambulatory Visit: Payer: Medicaid Other

## 2021-06-01 ENCOUNTER — Ambulatory Visit: Payer: Medicaid Other

## 2021-06-01 ENCOUNTER — Telehealth: Payer: Self-pay

## 2021-06-01 NOTE — Telephone Encounter (Signed)
PT called and spoke with patient who reports she overslept. Rescheduled treatment visit for 06/02/21.  Eloy End, PT 06/01/21 3:08 PM

## 2021-06-23 ENCOUNTER — Encounter (HOSPITAL_COMMUNITY): Payer: Self-pay | Admitting: Emergency Medicine

## 2021-06-23 ENCOUNTER — Other Ambulatory Visit: Payer: Self-pay

## 2021-06-23 ENCOUNTER — Ambulatory Visit (HOSPITAL_COMMUNITY)
Admission: EM | Admit: 2021-06-23 | Discharge: 2021-06-23 | Disposition: A | Discharge status: Home / Self Care | Payer: Medicaid Other | Attending: Family Medicine | Admitting: Family Medicine

## 2021-06-23 DIAGNOSIS — N3000 Acute cystitis without hematuria: Secondary | ICD-10-CM | POA: Diagnosis not present

## 2021-06-23 LAB — POCT URINALYSIS DIPSTICK, ED / UC
Bilirubin Urine: NEGATIVE
Glucose, UA: NEGATIVE mg/dL
Ketones, ur: NEGATIVE mg/dL
Nitrite: NEGATIVE
Protein, ur: 100 mg/dL — AB
Specific Gravity, Urine: 1.015 (ref 1.005–1.030)
Urobilinogen, UA: 0.2 mg/dL (ref 0.0–1.0)
pH: 7.5 (ref 5.0–8.0)

## 2021-06-23 LAB — POC URINE PREG, ED: Preg Test, Ur: NEGATIVE

## 2021-06-23 MED ORDER — CIPROFLOXACIN HCL 500 MG PO TABS: 500.0000 mg | ORAL_TABLET | Freq: Two times a day (BID) | ORAL | 0 refills | Status: AC

## 2021-06-23 MED ORDER — ONDANSETRON 4 MG PO TBDP: 4.0000 mg | ORAL_TABLET | Freq: Three times a day (TID) | ORAL | 0 refills | Status: DC | PRN

## 2021-06-23 MED ORDER — IBUPROFEN 800 MG PO TABS: 800.0000 mg | ORAL_TABLET | Freq: Three times a day (TID) | ORAL | 0 refills | Status: DC | PRN

## 2021-06-23 NOTE — Discharge Instructions (Addendum)
Your urinalysis showed some white blood cells and a little blood.  Urine culture has been sent, and staff will call you if you need a different treatment than what is been prescribed today Take Cipro 500 mg, 1 tab twice daily for 7 days Take ondansetron dissolved in your mouth 1 every 8 hours as needed for nausea or vomiting Take ibuprofen 800 mg 1 every 8 hours as needed for pain  Drink plenty of oral fluids

## 2021-06-23 NOTE — ED Triage Notes (Signed)
Onset Saturday with symptoms.  Patient has had frequency, urgency, right flank pain.  History of the same

## 2021-06-23 NOTE — ED Provider Notes (Signed)
[facility]    CSN: [id number] Arrival date & time: 06/23/21  [id number]      History   Chief Complaint Chief Complaint  Patient presents with   Urinary Tract Infection    HPI Diana Roman is a 36 y.o. female.    Urinary Tract Infection Here for a 1 day history of urinary frequency and incomplete bladder emptying.  Also she has been having right-sided lower back pain some right-sided lower abdominal discomfort also.  She is nauseated.  No vomiting or diarrhea.  No fever or chills.  Past Medical History:  Diagnosis Date   Abnormal Pap smear 2011   Anxiety    Bipolar 1 disorder ([place])    Depression    on meds, doing well   H/O bacterial infection    H/O varicella    Headache(784.0)    Frequent   Hypertension    Infection    UTI   Migraine    No pertinent past medical history    Sickle cell trait ([place])    Vaginal Pap smear, abnormal    repeat was normal   Yeast infection     Patient Active Problem List   Diagnosis Date Noted   Hypertension 10/25/2018   Bipolar 1 disorder, depressed, severe ([place]) 03/16/2018   Suicide attempt ([place])     Past Surgical History:  Procedure Laterality Date   DENTAL SURGERY     NO PAST SURGERIES      OB History     Gravida  1   Para  1   Term  1   Preterm  0   AB  0   Living  1      SAB  0   IAB  0   Ectopic  0   Multiple  0   Live Births  1            Home Medications    Prior to Admission medications   Medication Sig Start Date End Date Taking? Authorizing Provider  acetaminophen (TYLENOL) 325 MG tablet Take 650 mg by mouth every 6 (six) hours as needed.   Yes [provider]  ciprofloxacin (CIPRO) 500 MG tablet Take 1 tablet (500 mg total) by mouth 2 (two) times daily for 7 days. 06/23/21 06/30/21 Yes Staria Birkhead, Gwenlyn Perking, MD  ibuprofen (ADVIL) 800 MG tablet Take 1 tablet (800 mg total) by mouth every 8 (eight) hours as needed (pain). 06/23/21  Yes Barrett Henle, MD  ondansetron  (ZOFRAN-ODT) 4 MG disintegrating tablet Take 1 tablet (4 mg total) by mouth every 8 (eight) hours as needed for nausea or vomiting. 06/23/21  Yes Symphoni Helbling, Gwenlyn Perking, MD  ALPRAZolam Duanne Moron) 1 MG tablet 1/2 TO 1 PO QD PRN 05/05/21     labetalol (NORMODYNE) 100 MG tablet Take 1 tablet (100 mg total) by mouth 2 (two) times daily. 07/04/20   Caren Macadam, MD  lamoTRIgine (LAMICTAL) 25 MG tablet Take 25 mg by mouth daily.    [provider]  LATUDA 20 MG TABS tablet TAKE 1 TABLET BY MOUTH DAILY WITH BREAKFAST 02/16/19   Koberlein, Junell C, MD  lurasidone (LATUDA) 80 MG TABS tablet 1 PO QPM WITH FULL MEAL 05/05/21     Vitamin D, Ergocalciferol, (DRISDOL) 1.25 MG (50000 UNIT) CAPS capsule Take 1 capsule (50,000 Units total) by mouth every 7 (seven) days. Patient not taking: Reported on 06/23/2021 07/10/20   Caren Macadam, MD    Family History Family History  Problem Relation Age of Onset   Hypertension Mother    Hypertension Sister    Diabetes Maternal Grandmother    Glaucoma Maternal Grandmother     Social History Social History   Tobacco Use   Smoking status: Never   Smokeless tobacco: Never  Vaping Use   Vaping Use: Never used  Substance Use Topics   Alcohol use: Yes    Comment: 2/wk   Drug use: No     Allergies   Patient has no known allergies.   Review of Systems Review of Systems   Physical Exam Triage Vital Signs ED Triage Vitals  Enc Vitals Group     BP 06/23/21 0913 (!) 152/95     Pulse Rate 06/23/21 0913 79     Resp 06/23/21 0913 18     Temp 06/23/21 0913 98.2 F (36.8 C)     Temp Source 06/23/21 0913 Oral     SpO2 06/23/21 0913 97 %     Weight --      Height --      Head Circumference --      Peak Flow --      Pain Score 06/23/21 0910 7     Pain Loc --      Pain Edu? --      Excl. in [place]? --    No data found.  Updated Vital Signs BP (!) 152/95 (BP Location: Left Arm) Comment (BP Location): large cuff   Pulse 79    Temp 98.2 F (36.8  C) (Oral)    Resp 18    LMP 06/05/2021    SpO2 97%   Visual Acuity Right Eye Distance:   Left Eye Distance:   Bilateral Distance:    Right Eye Near:   Left Eye Near:    Bilateral Near:     Physical Exam Vitals reviewed.  Constitutional:      General: She is not in acute distress.    Appearance: She is not ill-appearing or toxic-appearing.  HENT:     Mouth/Throat:     Mouth: Mucous membranes are moist.  Eyes:     Pupils: Pupils are equal, round, and reactive to light.  Cardiovascular:     Rate and Rhythm: Normal rate and regular rhythm.     Heart sounds: No murmur heard. Pulmonary:     Effort: Pulmonary effort is normal.     Breath sounds: Normal breath sounds.  Abdominal:     Palpations: Abdomen is soft. There is no mass.     Tenderness: There is abdominal tenderness (mild lower abd, right lower). There is no right CVA tenderness (no tenderness to percussion. Is tender to palpation with hand), left CVA tenderness, guarding or rebound.  Musculoskeletal:     Cervical back: Neck supple.     Right lower leg: No edema.     Left lower leg: No edema.  Lymphadenopathy:     Cervical: No cervical adenopathy.  Skin:    Capillary Refill: Capillary refill takes less than 2 seconds.     Coloration: Skin is not jaundiced or pale.  Neurological:     General: No focal deficit present.     Mental Status: She is alert and oriented to person, place, and time.  Psychiatric:        Behavior: Behavior normal.     UC Treatments / Results  Labs (all labs ordered are listed, but only abnormal results are displayed) Labs Reviewed  POCT URINALYSIS DIPSTICK, ED / UC - Abnormal;  Notable for the following components:      Result Value   Hgb urine dipstick SMALL (*)    Protein, ur 100 (*)    Leukocytes,Ua MODERATE (*)    All other components within normal limits  URINE CULTURE  POC URINE PREG, ED    EKG   Radiology No results found.  Procedures Procedures (including critical care  time)  Medications Ordered in UC Medications - No data to display  Initial Impression / Assessment and Plan / UC Course  I have reviewed the triage vital signs and the nursing notes.  Pertinent labs & imaging results that were available during my care of the patient were reviewed by me and considered in my medical decision making (see chart for details).    Urinalysis shows moderate white blood cells and small amount of blood.  UPT is negative We will treat empirically for the UTI.  Also will prescribe Zofran and ibuprofen for symptom relief.  She will drink lots of oral fluids and urine culture sent Final Clinical Impressions(s) / UC Diagnoses   Final diagnoses:  Acute cystitis without hematuria     Discharge Instructions      Your urinalysis showed some white blood cells and a little blood.  Urine culture has been sent, and staff will call you if you need a different treatment than what is been prescribed today Take Cipro 500 mg, 1 tab twice daily for 7 days Take ondansetron dissolved in your mouth 1 every 8 hours as needed for nausea or vomiting Take ibuprofen 800 mg 1 every 8 hours as needed for pain  Drink plenty of oral fluids     ED Prescriptions     Medication Sig Dispense Auth. Provider   ciprofloxacin (CIPRO) 500 MG tablet Take 1 tablet (500 mg total) by mouth 2 (two) times daily for 7 days. 14 tablet Shamra Bradeen, Gwenlyn Perking, MD   ondansetron (ZOFRAN-ODT) 4 MG disintegrating tablet Take 1 tablet (4 mg total) by mouth every 8 (eight) hours as needed for nausea or vomiting. 10 tablet Barrett Henle, MD   ibuprofen (ADVIL) 800 MG tablet Take 1 tablet (800 mg total) by mouth every 8 (eight) hours as needed (pain). 21 tablet Yaa Donnellan, Gwenlyn Perking, MD      I have reviewed the PDMP during this encounter.   Barrett Henle, MD 06/23/21 [phone]

## 2021-06-25 LAB — URINE CULTURE: Culture: >=100000 — AB

## 2021-07-01 ENCOUNTER — Encounter: Payer: Medicaid Other | Admitting: Family Medicine

## 2021-07-01 NOTE — Progress Notes (Unsigned)
No show

## 2021-07-02 ENCOUNTER — Telehealth: Payer: Self-pay | Admitting: Family Medicine

## 2021-07-02 MED ORDER — LABETALOL HCL 100 MG PO TABS: 100.0000 mg | ORAL_TABLET | Freq: Two times a day (BID) | ORAL | 0 refills | Status: DC

## 2021-07-02 NOTE — Telephone Encounter (Signed)
Rx done. 

## 2021-07-02 NOTE — Telephone Encounter (Signed)
Pt call and stated she need a refill on blood pressure pills sent  to  [id number] [place], [place] - [address] Phone:  [phone]

## 2021-07-06 ENCOUNTER — Ambulatory Visit: Payer: Medicaid Other | Admitting: Family Medicine

## 2021-07-06 ENCOUNTER — Telehealth: Payer: Self-pay

## 2021-07-06 NOTE — Telephone Encounter (Signed)
She really needs an office visit.  She is is to follow-up with me in March 2022.  No showed visit on 2/15 with me.  Blood pressure was uncontrolled at time of her last visit and looks like it was still elevated on most recent ER visit.

## 2021-07-06 NOTE — Telephone Encounter (Signed)
Spoke with the patient and informed her an appt will be scheduled for 2/24 to arrive at 10:15am.  Message sent to [facility] to add the time slot.  I cancelled the appt on 3/3.

## 2021-07-06 NOTE — Telephone Encounter (Signed)
Error in previous message-appt on 3/3 was not cancelled as this is for a physical exam.

## 2021-07-06 NOTE — Telephone Encounter (Signed)
--  Caller requesting an rx for bacterial vaginosis. Symptoms are swelling, mild odor and itching around her vulva. Denies any other symptoms at this time. Pt diagnosed with a UTI about a week ago. Pt states she used monistat after this for this and it cleared up.   07/02/2021 8:11:45 PM See PCP within 24 Hours Gro[place], RN, Trula Ore  07/06/21 1240: Pt had appt with Dr Salomon Fick today at 3p but canceled due to menstral cycle starting today. Pt states this is typically something that happens after a round of abx. Symptoms remain persistent with OTC Monistat. Will send to PCP to determine if pt needs OV or will send prescription. Pt aware.

## 2021-07-10 ENCOUNTER — Ambulatory Visit (INDEPENDENT_AMBULATORY_CARE_PROVIDER_SITE_OTHER): Payer: Medicaid Other | Admitting: Family Medicine

## 2021-07-10 ENCOUNTER — Encounter: Payer: Self-pay | Admitting: Family Medicine

## 2021-07-10 VITALS — BP 128/80 | HR 79 | Temp 98.3°F | Ht 66.0 in | Wt 228.1 lb

## 2021-07-10 DIAGNOSIS — R0683 Snoring: Secondary | ICD-10-CM

## 2021-07-10 DIAGNOSIS — D619 Aplastic anemia, unspecified: Secondary | ICD-10-CM

## 2021-07-10 DIAGNOSIS — N979 Female infertility, unspecified: Secondary | ICD-10-CM

## 2021-07-10 DIAGNOSIS — I10 Essential (primary) hypertension: Secondary | ICD-10-CM

## 2021-07-10 DIAGNOSIS — Z131 Encounter for screening for diabetes mellitus: Secondary | ICD-10-CM | POA: Diagnosis not present

## 2021-07-10 DIAGNOSIS — N898 Other specified noninflammatory disorders of vagina: Secondary | ICD-10-CM

## 2021-07-10 DIAGNOSIS — Z13220 Encounter for screening for lipoid disorders: Secondary | ICD-10-CM

## 2021-07-10 DIAGNOSIS — R5383 Other fatigue: Secondary | ICD-10-CM

## 2021-07-10 DIAGNOSIS — Z79899 Other long term (current) drug therapy: Secondary | ICD-10-CM

## 2021-07-10 LAB — CBC WITH DIFFERENTIAL/PLATELET
Basophils Absolute: 0 10*3/uL (ref 0.0–0.1)
Basophils Relative: 0.7 % (ref 0.0–3.0)
Eosinophils Absolute: 0.1 10*3/uL (ref 0.0–0.7)
Eosinophils Relative: 1.7 % (ref 0.0–5.0)
HCT: 29.8 % — ABNORMAL LOW (ref 36.0–46.0)
Hemoglobin: 9.5 g/dL — ABNORMAL LOW (ref 12.0–15.0)
Lymphocytes Relative: 35.1 % (ref 12.0–46.0)
Lymphs Abs: 2 10*3/uL (ref 0.7–4.0)
MCHC: 32 g/dL (ref 30.0–36.0)
MCV: 79.2 fl (ref 78.0–100.0)
Monocytes Absolute: 0.6 10*3/uL (ref 0.1–1.0)
Monocytes Relative: 11.4 % (ref 3.0–12.0)
Neutro Abs: 2.9 10*3/uL (ref 1.4–7.7)
Neutrophils Relative %: 51.1 % (ref 43.0–77.0)
Platelets: 267 10*3/uL (ref 150.0–400.0)
RBC: 3.76 Mil/uL — ABNORMAL LOW (ref 3.87–5.11)
RDW: 17.7 % — ABNORMAL HIGH (ref 11.5–15.5)
WBC: 5.6 10*3/uL (ref 4.0–10.5)

## 2021-07-10 LAB — COMPREHENSIVE METABOLIC PANEL
ALT: 17 U/L (ref 0–35)
AST: 21 U/L (ref 0–37)
Albumin: 4 g/dL (ref 3.5–5.2)
Alkaline Phosphatase: 44 U/L (ref 39–117)
BUN: 24 mg/dL — ABNORMAL HIGH (ref 6–23)
CO2: 30 mEq/L (ref 19–32)
Calcium: 9.2 mg/dL (ref 8.4–10.5)
Chloride: 104 mEq/L (ref 96–112)
Creatinine, Ser: 0.93 mg/dL (ref 0.40–1.20)
GFR: 80 mL/min (ref >60.00)
Glucose, Bld: 87 mg/dL (ref 70–99)
Potassium: 3.5 mEq/L (ref 3.5–5.1)
Sodium: 137 mEq/L (ref 135–145)
Total Bilirubin: 0.3 mg/dL (ref 0.2–1.2)
Total Protein: 7.1 g/dL (ref 6.0–8.3)

## 2021-07-10 LAB — LIPID PANEL
Cholesterol: 163 mg/dL (ref 0–200)
HDL: 42.3 mg/dL (ref >39.00)
LDL Cholesterol: 92 mg/dL (ref 0–99)
NonHDL: 120.54
Total CHOL/HDL Ratio: 4
Triglycerides: 141 mg/dL (ref 0.0–149.0)
VLDL: 28.2 mg/dL (ref 0.0–40.0)

## 2021-07-10 LAB — HEMOGLOBIN A1C: Hgb A1c MFr Bld: 5.8 % (ref 4.6–6.5)

## 2021-07-10 LAB — TSH: TSH: 1.66 u[IU]/mL (ref 0.35–5.50)

## 2021-07-10 MED ORDER — LABETALOL HCL 200 MG PO TABS
200.0000 mg | ORAL_TABLET | Freq: Two times a day (BID) | ORAL | 2 refills | Status: DC
Start: 2021-07-10 — End: 2022-02-11

## 2021-07-10 MED ORDER — FLUCONAZOLE 150 MG PO TABS: 150.0000 mg | ORAL_TABLET | Freq: Once | ORAL | 0 refills | Status: AC

## 2021-07-10 MED ORDER — METRONIDAZOLE 500 MG PO TABS: 500.0000 mg | ORAL_TABLET | Freq: Two times a day (BID) | ORAL | 0 refills | Status: DC

## 2021-07-10 NOTE — Progress Notes (Signed)
Diana Roman DOB: 12-01-1985 Encounter date: 07/10/2021  This is a 36 y.o. female who presents with Chief Complaint  Patient presents with   Hypertension   Vaginal Discharge    History of present illness: Seen in ED 06/23/21 and dx with acute cystitis without hematuria. Tx with cipro. Developed sx of BV aftertreatment. She tried Teacher, adult education. Urine preg negative 2/7 in ER. All urinary sx improved with antibiotic.   Last visit with me was 07/14/20. At that time she had uncontrolled bp, and due to desire to become pregnant, labetalol was added for bp control.  Currently just itching around vulva. Can't tell about discharge right now due to menses. Not much discharge but was thick, white, no odor.monistat one day didn't work. Does seem better than it was before. Not as itchy as it was before. Current flow - moderate. Has 2 days left of cycle.    Last night bp at work was 160/110 prior to taking her medication. Does check regularly at home. With meds pressure is in 140's/90's. Without meds always getting higher systolics in [id number] with diastolics over [id number]. Tired,frustrated. Still desires pregnancy.   Following with Dr. Toy Care but couldn't get lithium due to needing bloodwork.  Last visit with her was in November. She wanted tsh and cmp. Mood was better on lithium. Has been stressing importance of getting sleep study.   She has tried now for over a year to get pregnant.   No Known Allergies Current Meds  Medication Sig   acetaminophen (TYLENOL) 325 MG tablet Take 650 mg by mouth every 6 (six) hours as needed.   ALPRAZolam (XANAX) 1 MG tablet 1/2 TO 1 PO QD PRN   ibuprofen (ADVIL) 800 MG tablet Take 1 tablet (800 mg total) by mouth every 8 (eight) hours as needed (pain).   labetalol (NORMODYNE) 100 MG tablet Take 1 tablet (100 mg total) by mouth 2 (two) times daily.   lamoTRIgine (LAMICTAL) 25 MG tablet Take 25 mg by mouth daily.   LATUDA 20 MG TABS tablet TAKE 1 TABLET BY MOUTH DAILY WITH  BREAKFAST   lurasidone (LATUDA) 80 MG TABS tablet 1 PO QPM WITH FULL MEAL   ondansetron (ZOFRAN-ODT) 4 MG disintegrating tablet Take 1 tablet (4 mg total) by mouth every 8 (eight) hours as needed for nausea or vomiting.   Vitamin D, Ergocalciferol, (DRISDOL) 1.25 MG (50000 UNIT) CAPS capsule Take 1 capsule (50,000 Units total) by mouth every 7 (seven) days.    Review of Systems  Constitutional:  Positive for fatigue. Negative for chills and fever.  Respiratory:  Negative for cough, chest tightness, shortness of breath and wheezing.   Cardiovascular:  Negative for chest pain, palpitations and leg swelling.  Genitourinary:  Positive for vaginal bleeding. Negative for difficulty urinating and dysuria. Vaginal discharge: difficult to tell. was white, thick prior to menses starting.  Objective:  BP 128/80 (BP Location: Left Arm, Patient Position: Sitting, Cuff Size: Large)    Pulse 79    Temp 98.3 F (36.8 C) (Oral)    Ht 5\' 6"  (1.676 m)    Wt 228 lb 1.6 oz (103.5 kg)    LMP 07/05/2021 (Exact Date)    SpO2 98%    BMI 36.82 kg/m   Weight: 228 lb 1.6 oz (103.5 kg)   BP Readings from Last 3 Encounters:  07/10/21 128/80  06/23/21 (!) 152/95  04/21/21 120/84   Wt Readings from Last 3 Encounters:  07/10/21 228 lb 1.6 oz (103.5 kg)  04/21/21  225 lb 6.4 oz (102.2 kg)  11/12/20 222 lb 3.2 oz (100.8 kg)    Physical Exam Constitutional:      General: She is not in acute distress.    Appearance: She is well-developed.     Comments: She is tired; just finished night shift.  Cardiovascular:     Rate and Rhythm: Normal rate and regular rhythm.     Heart sounds: Normal heart sounds. No murmur heard.   No friction rub.  Pulmonary:     Effort: Pulmonary effort is normal. No respiratory distress.     Breath sounds: Normal breath sounds. No wheezing or rales.  Chest:     Comments: She has tenderness to palpation left side of chest; states chest has been thisway for years. No chest pain with  exertion; just to touch. No palpable abnormality. Musculoskeletal:     Right lower leg: No edema.     Left lower leg: No edema.  Neurological:     Mental Status: She is oriented to person, place, and time.  Psychiatric:        Behavior: Behavior normal.    Assessment/Plan  1. 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 We are going to increase her labetalol to 200mg  BID. She prefers to treat with single med if possible.  - Comprehensive metabolic panel; Future - CBC with Differential/Platelet; Future - Ambulatory referral to Sleep Studies - Ambulatory referral to Obstetrics / Gynecology - Comprehensive metabolic panel - CBC with Differential/Platelet  2. Screening for diabetes mellitus - Hemoglobin A1c; Future - Hemoglobin A1c  3. Vaginal discharge Take diflucan now; if discharge still present once menses resolves, or notes odor with discharge, then can take flagyl. She has had BV on multiple occasions that has responded well to flagyl.  - C. trachomatis/N. gonorrhoeae RNA; Future - C. trachomatis/N. gonorrhoeae RNA  4. Snoring Recommended by specialist, but she hasn't followed through. She is agreeable to evaluation and we discussed risks of sleep apnea as well as benefits from treatment of sleep apnea if present. - Ambulatory referral to Sleep Studies  5. Fatigue, unspecified type - TSH; Future - Ambulatory referral to Sleep Studies - TSH  6. Lipid screening - Lipid panel; Future - Lipid panel  7. High risk medication use Psychiatry wanted blood work before restarting some of her psychiatric medications.  We will get this today. - Comprehensive metabolic panel; Future - CBC with Differential/Platelet; Future - TSH; Future - Comprehensive metabolic panel - CBC with Differential/Platelet - TSH  8. Idiopathic female infertility - Ambulatory referral to Obstetrics /  Gynecology   Return in about 1 month (around 08/07/2021) for Chronic condition visit.      Micheline Rough, MD

## 2021-07-10 NOTE — Patient Instructions (Addendum)
Let me know how blood pressures look after you increase your labetalol to 200mg  twice daily (ok to take 2 of your 100mg  pills twice daily until you run out). I have sent in a new prescription for 200mg  twice daily, but if your pressures are still regularly over [id number] systolic, we can increase to 300mg  twice daily and I will send in a new rx.   Take diflucan now. If your vaginal symptoms persist in 48 hours, then start the flagyl as directed.

## 2021-07-13 LAB — C. TRACHOMATIS/N. GONORRHOEAE RNA
C. trachomatis RNA, TMA: NOT DETECTED
N. gonorrhoeae RNA, TMA: NOT DETECTED

## 2021-07-15 NOTE — Addendum Note (Signed)
Addended by: Nilda Riggs on: 07/15/2021 08:53 AM   Modules accepted: Orders

## 2021-07-17 ENCOUNTER — Ambulatory Visit (INDEPENDENT_AMBULATORY_CARE_PROVIDER_SITE_OTHER): Payer: Medicaid Other | Admitting: Family Medicine

## 2021-07-17 ENCOUNTER — Encounter: Payer: Self-pay | Admitting: Family Medicine

## 2021-07-17 VITALS — BP 136/86 | HR 70 | Temp 98.2°F | Ht 65.75 in | Wt 229.4 lb

## 2021-07-17 DIAGNOSIS — Z0000 Encounter for general adult medical examination without abnormal findings: Secondary | ICD-10-CM | POA: Diagnosis not present

## 2021-07-17 DIAGNOSIS — F314 Bipolar disorder, current episode depressed, severe, without psychotic features: Secondary | ICD-10-CM

## 2021-07-17 DIAGNOSIS — N979 Female infertility, unspecified: Secondary | ICD-10-CM

## 2021-07-17 DIAGNOSIS — I10 Essential (primary) hypertension: Secondary | ICD-10-CM | POA: Diagnosis not present

## 2021-07-17 DIAGNOSIS — D509 Iron deficiency anemia, unspecified: Secondary | ICD-10-CM

## 2021-07-17 MED ORDER — PRENATAL/IRON PO TABS: ORAL_TABLET | ORAL | 1 refills | Status: DC

## 2021-07-17 NOTE — Patient Instructions (Signed)
Let me know how blood pressures look next week and whether you have heard from referrals at that point.  ?

## 2021-07-17 NOTE — Progress Notes (Signed)
Diana Roman DOB: [birth year] Encounter date: 07/17/2021  This is a 36 y.o. female who presents for complete physical   History of present illness/Additional concerns: Patient was seen last week for follow-up after urgent care visit and diagnosis of UTI followed by development of yeast infection.  We gave her Diflucan last week and printed out prescription for Flagyl in case vaginal symptoms did not resolve.  She has had recurrent BV in the past.  Additionally, blood pressure was elevated at visit time.  We increased labetalol to 200 mg twice daily.  She was referred to high risk OB/GYN as she desires pregnancy but has not gotten pregnant in the last year.  Referral was not accepted because it needed to be addressed as an fertility, so this has been replaced today.  Still waiting on confirmation for sleep study referral.  Has had some headaches - taking tylenol for that.   BP was 150/106 at home before medication. This is on the 200mg  BID. Had headaches before but not as bad as these were. Takes the medication about 8:30 am; then wakes around 3pm with headache. Headache seems more related to after sleeping.   All vaginal sx are better.   Getting leg cramps. Bad last night; going behind leg.    Past Medical History:  Diagnosis Date   Abnormal Pap smear 2011   Anxiety    Bipolar 1 disorder (HCC)    Depression    on meds, doing well   H/O bacterial infection    H/O varicella    Headache(784.0)    Frequent   Hypertension    Infection    UTI   Migraine    No pertinent past medical history    Sickle cell trait (HCC)    Vaginal Pap smear, abnormal    repeat was normal   Yeast infection    Past Surgical History:  Procedure Laterality Date   DENTAL SURGERY     NO PAST SURGERIES     No Known Allergies Current Meds  Medication Sig   acetaminophen (TYLENOL) 325 MG tablet Take 650 mg by mouth every 6 (six) hours as needed.   ALPRAZolam (XANAX) 1 MG tablet 1/2 TO 1 PO QD PRN    ibuprofen (ADVIL) 800 MG tablet Take 1 tablet (800 mg total) by mouth every 8 (eight) hours as needed (pain).   labetalol (NORMODYNE) 200 MG tablet Take 1 tablet (200 mg total) by mouth 2 (two) times daily.   lamoTRIgine (LAMICTAL) 25 MG tablet Take 25 mg by mouth daily.   LATUDA 20 MG TABS tablet TAKE 1 TABLET BY MOUTH DAILY WITH BREAKFAST   lurasidone (LATUDA) 80 MG TABS tablet 1 PO QPM WITH FULL MEAL   metroNIDAZOLE (FLAGYL) 500 MG tablet Take 1 tablet (500 mg total) by mouth 2 (two) times daily.   ondansetron (ZOFRAN-ODT) 4 MG disintegrating tablet Take 1 tablet (4 mg total) by mouth every 8 (eight) hours as needed for nausea or vomiting.   Prenatal Multivit-Min-Fe-FA (PRENATAL/IRON) TABS Take as directed per bottle instructions.   Vitamin D, Ergocalciferol, (DRISDOL) 1.25 MG (50000 UNIT) CAPS capsule Take 1 capsule (50,000 Units total) by mouth every 7 (seven) days.   Social History   Tobacco Use   Smoking status: Never   Smokeless tobacco: Never  Substance Use Topics   Alcohol use: Yes    Comment: 2/wk   Family History  Problem Relation Age of Onset   Hypertension Mother    Hypertension Sister  Diabetes Maternal Grandmother    Glaucoma Maternal Grandmother      Review of Systems  Constitutional:  Negative for activity change, appetite change, chills, fatigue, fever and unexpected weight change.  HENT:  Negative for congestion, ear pain, hearing loss, sinus pressure, sinus pain, sore throat and trouble swallowing.   Eyes:  Negative for pain and visual disturbance.  Respiratory:  Negative for cough, chest tightness, shortness of breath and wheezing.   Cardiovascular:  Negative for chest pain, palpitations and leg swelling.  Gastrointestinal:  Negative for abdominal pain, blood in stool, constipation, diarrhea, nausea and vomiting.  Genitourinary:  Negative for difficulty urinating and menstrual problem.  Musculoskeletal:  Negative for arthralgias and back pain.  Skin:   Negative for rash.  Neurological:  Negative for dizziness, weakness, numbness and headaches.  Hematological:  Negative for adenopathy. Does not bruise/bleed easily.  Psychiatric/Behavioral:  Negative for sleep disturbance and suicidal ideas. The patient is not nervous/anxious.    CBC:  Lab Results  Component Value Date   WBC 5.6 07/10/2021   HGB 9.5 (L) 07/10/2021   HCT 29.8 (L) 07/10/2021   MCH 27.1 07/04/2020   MCHC 32.0 07/10/2021   RDW 17.7 (H) 07/10/2021   PLT 267.0 07/10/2021   MPV 11.0 07/04/2020   CMP: Lab Results  Component Value Date   NA 137 07/10/2021   K 3.5 07/10/2021   CL 104 07/10/2021   CO2 30 07/10/2021   ANIONGAP 6 09/29/2018   GLUCOSE 87 07/10/2021   BUN 24 (H) 07/10/2021   CREATININE 0.93 07/10/2021   CREATININE 0.81 07/04/2020   GFRAA >60 09/29/2018   CALCIUM 9.2 07/10/2021   PROT 7.1 07/10/2021   BILITOT 0.3 07/10/2021   ALKPHOS 44 07/10/2021   ALT 17 07/10/2021   AST 21 07/10/2021   LIPID: Lab Results  Component Value Date   CHOL 163 07/10/2021   TRIG 141.0 07/10/2021   HDL 42.30 07/10/2021   LDLCALC 92 07/10/2021   LDLCALC 111 (H) 07/04/2020    Objective:  BP (!) 136/92 (BP Location: Left Arm, Patient Position: Sitting, Cuff Size: Large)   Pulse 70   Temp 98.2 F (36.8 C) (Oral)   Ht 5' 5.75" (1.67 m)   Wt 229 lb 6.4 oz (104.1 kg)   LMP 07/05/2021 (Exact Date)   SpO2 98%   BMI 37.31 kg/m   Weight: 229 lb 6.4 oz (104.1 kg)   BP Readings from Last 3 Encounters:  07/17/21 (!) 136/92  07/10/21 128/80  06/23/21 (!) 152/95   Wt Readings from Last 3 Encounters:  07/17/21 229 lb 6.4 oz (104.1 kg)  07/10/21 228 lb 1.6 oz (103.5 kg)  04/21/21 225 lb 6.4 oz (102.2 kg)    Physical Exam Constitutional:      General: She is not in acute distress.    Appearance: She is well-developed.  HENT:     Head: Normocephalic and atraumatic.     Right Ear: External ear normal.     Left Ear: External ear normal.     Mouth/Throat:      Pharynx: No oropharyngeal exudate.  Eyes:     Conjunctiva/sclera: Conjunctivae normal.     Pupils: Pupils are equal, round, and reactive to light.  Neck:     Thyroid: No thyromegaly.  Cardiovascular:     Rate and Rhythm: Normal rate and regular rhythm.     Heart sounds: Normal heart sounds. No murmur heard.   No friction rub. No gallop.  Pulmonary:  Effort: Pulmonary effort is normal.     Breath sounds: Normal breath sounds.  Abdominal:     General: Bowel sounds are normal. There is no distension.     Palpations: Abdomen is soft. There is no mass.     Tenderness: There is no abdominal tenderness. There is no guarding.     Hernia: No hernia is present.  Musculoskeletal:        General: No tenderness or deformity. Normal range of motion.     Cervical back: Normal range of motion and neck supple.  Lymphadenopathy:     Cervical: No cervical adenopathy.  Skin:    General: Skin is warm and dry.     Findings: No rash.  Neurological:     Mental Status: She is alert and oriented to person, place, and time.     Deep Tendon Reflexes: Reflexes normal.     Reflex Scores:      Tricep reflexes are 2+ on the right side and 2+ on the left side.      Bicep reflexes are 2+ on the right side and 2+ on the left side.      Brachioradialis reflexes are 2+ on the right side and 2+ on the left side.      Patellar reflexes are 2+ on the right side and 2+ on the left side. Psychiatric:        Speech: Speech normal.        Behavior: Behavior normal.        Thought Content: Thought content normal.    Assessment/Plan: Health Maintenance Due  Topic Date Due   HIV Screening  Never done   Health Maintenance reviewed.  1. Preventative health care - HIV Antibody (routine testing w rflx); Future  2. Primary hypertension Improved from previous. She will continue to check through this week and update me next week with numbers. May need additional titration of medication, but prefers to give a little  longer before adjustment. Currently on labetalol 200mg  BID.  3. Bipolar 1 disorder, depressed, severe (HCC) Mood has been stable. Following with psychiatry. Will need to be closely followed during pursuit of fertility/pregnancy.  4. Infertility, female - Ambulatory referral to Infertility  5. Iron deficiency anemia, unspecified iron deficiency anemia type Encouraged prenatal with iron. Recheck bloodwork in a month. She does have hx of anemia, heavy menses. - IBC + Ferritin; Future - CBC with Differential/Platelet; Future   Return in about 1 month (around 08/17/2021) for lab visit.  Theodis Shove, MD

## 2021-08-17 ENCOUNTER — Other Ambulatory Visit: Payer: Medicaid Other

## 2021-09-01 ENCOUNTER — Telehealth: Payer: Self-pay | Admitting: Family Medicine

## 2021-09-01 ENCOUNTER — Other Ambulatory Visit: Payer: Self-pay | Admitting: Family Medicine

## 2021-09-01 NOTE — Telephone Encounter (Signed)
Pt is calling and per pt dr Hassan Rowan prescribed this medication ondansetron (ZOFRAN-ODT) 4 MG disintegrating tablet and ibuprofen (ADVIL) 800 MG tablet last refill by provider at [facility]  ?[id number] [place], [place] - [address] Phone:  [phone]  ?Fax:  [phone]  ?  ? ?

## 2021-09-01 NOTE — Telephone Encounter (Signed)
Spoke with the patient and offered a visit if she has nausea and vomiting.  Patient stated she needs the medications below due to nausea and pain that occurs during her menstrual cycle which she is currently on.  States this was mentioned at the last visit also and message was sent to PCP. ?

## 2021-09-02 ENCOUNTER — Other Ambulatory Visit: Payer: Self-pay | Admitting: Family Medicine

## 2021-09-02 MED ORDER — ONDANSETRON 4 MG PO TBDP: 4.0000 mg | ORAL_TABLET | Freq: Three times a day (TID) | ORAL | 2 refills | Status: DC | PRN

## 2021-09-02 NOTE — Telephone Encounter (Signed)
Med sent.

## 2021-09-02 NOTE — Telephone Encounter (Signed)
Patient informed. 

## 2021-09-04 ENCOUNTER — Other Ambulatory Visit: Payer: Self-pay

## 2021-09-04 MED ORDER — ACETAMINOPHEN 325 MG PO TABS
650.0000 mg | ORAL_TABLET | Freq: Four times a day (QID) | ORAL | 0 refills | Status: AC | PRN
  Filled 2021-09-04 – 2021-12-23 (×2): qty 120, 15d supply, fill #0

## 2021-09-04 NOTE — Telephone Encounter (Signed)
Unusual to get tylenol request? Maybe covered by insurance? No question with this note, just sending as fyi in case we get question back on this.  ?

## 2021-12-17 ENCOUNTER — Other Ambulatory Visit (HOSPITAL_COMMUNITY): Payer: Self-pay

## 2021-12-21 ENCOUNTER — Other Ambulatory Visit (HOSPITAL_COMMUNITY): Payer: Self-pay

## 2021-12-23 ENCOUNTER — Other Ambulatory Visit: Payer: Self-pay

## 2021-12-23 ENCOUNTER — Other Ambulatory Visit (HOSPITAL_BASED_OUTPATIENT_CLINIC_OR_DEPARTMENT_OTHER): Payer: Self-pay

## 2021-12-25 ENCOUNTER — Other Ambulatory Visit (HOSPITAL_BASED_OUTPATIENT_CLINIC_OR_DEPARTMENT_OTHER): Payer: Self-pay

## 2021-12-28 ENCOUNTER — Encounter (HOSPITAL_BASED_OUTPATIENT_CLINIC_OR_DEPARTMENT_OTHER): Payer: Self-pay | Admitting: Pharmacist

## 2021-12-28 ENCOUNTER — Other Ambulatory Visit (HOSPITAL_BASED_OUTPATIENT_CLINIC_OR_DEPARTMENT_OTHER): Payer: Self-pay

## 2022-01-11 ENCOUNTER — Telehealth: Payer: Self-pay | Admitting: Family Medicine

## 2022-01-11 ENCOUNTER — Encounter: Payer: Self-pay | Admitting: Internal Medicine

## 2022-01-11 ENCOUNTER — Ambulatory Visit (INDEPENDENT_AMBULATORY_CARE_PROVIDER_SITE_OTHER): Payer: No Typology Code available for payment source | Admitting: Internal Medicine

## 2022-01-11 VITALS — BP 154/106 | HR 77 | Temp 98.8°F | Wt 233.9 lb

## 2022-01-11 DIAGNOSIS — G8929 Other chronic pain: Secondary | ICD-10-CM

## 2022-01-11 DIAGNOSIS — I10 Essential (primary) hypertension: Secondary | ICD-10-CM | POA: Diagnosis not present

## 2022-01-11 DIAGNOSIS — R519 Headache, unspecified: Secondary | ICD-10-CM | POA: Diagnosis not present

## 2022-01-11 DIAGNOSIS — G4733 Obstructive sleep apnea (adult) (pediatric): Secondary | ICD-10-CM

## 2022-01-11 MED ORDER — AMLODIPINE BESYLATE 5 MG PO TABS: 5.0000 mg | ORAL_TABLET | Freq: Every day | ORAL | 1 refills | Status: DC

## 2022-01-11 NOTE — Telephone Encounter (Signed)
Pt stated she has seen Dr. Salomon Fick before and really like her; since pt is a [facility] pt then pt req for Dr. Salomon Fick to be her Provider.   Pt is aware a msg has been sent to Dr and will be waiting on an answer.   Please Advise.

## 2022-01-11 NOTE — Progress Notes (Signed)
Established Patient Office Visit     CC/Reason for Visit: Discuss headaches  HPI: Diana Roman is a 36 y.o. female who is coming in today for the above mentioned reasons. Past Medical History is significant for: Hypertension and bipolar disorder.  She used to see Dr. Hassan Rowan and has not yet established care with anyone else since.  She is seen today for an acute issue of headaches.  These have been ongoing for years.  They have worsened lately.  She is noted to have very elevated blood pressure readings in office today, these coincide with home measurements.  She is on labetalol 200 mg twice daily.  She snores a lot does not sleep well at night and is very tired in the morning, she is concerned about the possibility of sleep apnea.   Past Medical/Surgical History: Past Medical History:  Diagnosis Date   Abnormal Pap smear 2011   Anxiety    Bipolar 1 disorder (HCC)    Depression    on meds, doing well   H/O bacterial infection    H/O varicella    Headache(784.0)    Frequent   Hypertension    Infection    UTI   Migraine    No pertinent past medical history    Sickle cell trait (HCC)    Vaginal Pap smear, abnormal    repeat was normal   Yeast infection     Past Surgical History:  Procedure Laterality Date   DENTAL SURGERY     NO PAST SURGERIES      Social History:  reports that she has never smoked. She has never used smokeless tobacco. She reports current alcohol use. She reports that she does not use drugs.  Allergies: No Known Allergies  Family History:  Family History  Problem Relation Age of Onset   Hypertension Mother    Hypertension Sister    Diabetes Maternal Grandmother    Glaucoma Maternal Grandmother      Current Outpatient Medications:    acetaminophen (TYLENOL) 325 MG tablet, Take 2 tablets (650 mg total) by mouth every 6 (six) hours as needed., Disp: 120 tablet, Rfl: 0   ALPRAZolam (XANAX) 1 MG tablet, 1/2 TO 1 PO QD PRN, Disp: 30 tablet,  Rfl: 2   amLODipine (NORVASC) 5 MG tablet, Take 1 tablet (5 mg total) by mouth daily., Disp: 90 tablet, Rfl: 1   ibuprofen (ADVIL) 800 MG tablet, Take 1 tablet (800 mg total) by mouth every 8 (eight) hours as needed (pain)., Disp: 21 tablet, Rfl: 0   labetalol (NORMODYNE) 200 MG tablet, Take 1 tablet (200 mg total) by mouth 2 (two) times daily., Disp: 60 tablet, Rfl: 2   lamoTRIgine (LAMICTAL) 25 MG tablet, Take 25 mg by mouth daily., Disp: , Rfl:    LATUDA 20 MG TABS tablet, TAKE 1 TABLET BY MOUTH DAILY WITH BREAKFAST, Disp: 30 tablet, Rfl: 2   lurasidone (LATUDA) 80 MG TABS tablet, Take 1 tablet (80 mg total) by mouth every evening with a full meal, Disp: 30 tablet, Rfl: 12   metroNIDAZOLE (FLAGYL) 500 MG tablet, Take 1 tablet (500 mg total) by mouth 2 (two) times daily., Disp: 14 tablet, Rfl: 0   ondansetron (ZOFRAN-ODT) 4 MG disintegrating tablet, Take 1 tablet (4 mg total) by mouth every 8 (eight) hours as needed for nausea or vomiting. (Patient taking differently: Take 8 mg by mouth every 8 (eight) hours as needed for nausea or vomiting.), Disp: 10 tablet, Rfl: 2  Prenatal Multivit-Min-Fe-FA (PRENATAL/IRON) TABS, Take as directed per bottle instructions., Disp: 90 tablet, Rfl: 1   Vitamin D, Ergocalciferol, (DRISDOL) 1.25 MG (50000 UNIT) CAPS capsule, Take 1 capsule (50,000 Units total) by mouth every 7 (seven) days., Disp: 4 capsule, Rfl: 0  Review of Systems:  Constitutional: Denies fever, chills, diaphoresis, appetite change and fatigue.  HEENT: Denies photophobia, eye pain, redness, hearing loss, ear pain, congestion, sore throat, rhinorrhea, sneezing, mouth sores, trouble swallowing, neck pain, neck stiffness and tinnitus.   Respiratory: Denies SOB, DOE, cough, chest tightness,  and wheezing.   Cardiovascular: Denies chest pain, palpitations and leg swelling.  Gastrointestinal: Denies nausea, vomiting, abdominal pain, diarrhea, constipation, blood in stool and abdominal distention.   Genitourinary: Denies dysuria, urgency, frequency, hematuria, flank pain and difficulty urinating.  Endocrine: Denies: hot or cold intolerance, sweats, changes in hair or nails, polyuria, polydipsia. Musculoskeletal: Denies myalgias, back pain, joint swelling, arthralgias and gait problem.  Skin: Denies pallor, rash and wound.  Neurological: Denies dizziness, seizures, syncope, weakness, light-headedness, numbness. Hematological: Denies adenopathy. Easy bruising, personal or family bleeding history  Psychiatric/Behavioral: Denies suicidal ideation, mood changes, confusion, nervousness, sleep disturbance and agitation    Physical Exam: Vitals:   01/11/22 1025 01/11/22 1031  BP: (!) 150/110 (!) 154/106  Pulse: 77   Temp: 98.8 F (37.1 C)   TempSrc: Oral   SpO2: 99%   Weight: 233 lb 14.4 oz (106.1 kg)     Body mass index is 38.04 kg/m.   Constitutional: NAD, calm, comfortable Eyes: PERRL, lids and conjunctivae normal ENMT: Mucous membranes are moist.  Respiratory: clear to auscultation bilaterally, no wheezing, no crackles. Normal respiratory effort. No accessory muscle use.  Cardiovascular: Regular rate and rhythm, no murmurs / rubs / gallops. No extremity edema.  Psychiatric: Normal judgment and insight. Alert and oriented x 3. Normal mood.    Impression and Plan:  Primary hypertension  - Plan: amLODipine (NORVASC) 5 MG tablet -Very uncontrolled, continue labetalol 200 mg twice daily, add amlodipine 5 mg daily. -She will do ambulatory blood pressure monitoring and return in 6 weeks for follow-up.  Chronic nonintractable headache, unspecified headache type -Suspect related to headaches  OSA (obstructive sleep apnea)  - Plan: Ambulatory referral to Neurology for sleep study.    Time spent:32 minutes reviewing chart, interviewing and examining patient and formulating plan of care.    Chaya Jan, MD [facility]

## 2022-01-26 ENCOUNTER — Telehealth: Payer: Self-pay | Admitting: Family Medicine

## 2022-01-26 ENCOUNTER — Ambulatory Visit (INDEPENDENT_AMBULATORY_CARE_PROVIDER_SITE_OTHER): Payer: No Typology Code available for payment source | Admitting: Family Medicine

## 2022-01-26 ENCOUNTER — Encounter: Payer: Self-pay | Admitting: Family Medicine

## 2022-01-26 VITALS — BP 126/94 | HR 80 | Temp 98.8°F | Wt 229.0 lb

## 2022-01-26 DIAGNOSIS — Z3A01 Less than 8 weeks gestation of pregnancy: Secondary | ICD-10-CM | POA: Diagnosis not present

## 2022-01-26 DIAGNOSIS — N926 Irregular menstruation, unspecified: Secondary | ICD-10-CM

## 2022-01-26 LAB — POCT URINE PREGNANCY: Preg Test, Ur: POSITIVE — AB

## 2022-01-26 MED ORDER — ONDANSETRON 4 MG PO TBDP: 4.0000 mg | ORAL_TABLET | Freq: Three times a day (TID) | ORAL | 0 refills | Status: DC | PRN

## 2022-01-26 MED ORDER — PRENATAL/IRON PO TABS: ORAL_TABLET | ORAL | 0 refills | Status: DC

## 2022-01-26 NOTE — Progress Notes (Signed)
   Subjective:    Patient ID: Diana Roman, female    DOB: 08-Aug-1985, 36 y.o.   MRN: [id number]  HPI Here to be checked for pregnancy. Her last menses started on 12-22-21 and ended on 12-29-21. She tested positive at home yesterday. She feels fine other than some occasional nausea. She does not use tobacco or alcohol. Her pregancy test here today is also positive.    Review of Systems  Constitutional: Negative.   Respiratory: Negative.    Cardiovascular: Negative.   Gastrointestinal:  Positive for nausea. Negative for abdominal pain and vomiting.       Objective:   Physical Exam Constitutional:      Appearance: Normal appearance. She is not ill-appearing.  Cardiovascular:     Rate and Rhythm: Normal rate and regular rhythm.     Pulses: Normal pulses.     Heart sounds: Normal heart sounds.  Pulmonary:     Effort: Pulmonary effort is normal.     Breath sounds: Normal breath sounds.  Neurological:     Mental Status: She is alert.           Assessment & Plan:  Pregnancy. She will start back on prenatal vitamins daily. Refer to OB-GYN. Gershon Crane, MD

## 2022-01-26 NOTE — Telephone Encounter (Signed)
Patient would like to do TOC from Dr.Koberlein to Dr.Banks. Okay to schedule?     Please advise

## 2022-02-09 NOTE — Telephone Encounter (Signed)
Ok

## 2022-02-10 ENCOUNTER — Other Ambulatory Visit: Payer: Self-pay

## 2022-02-10 ENCOUNTER — Inpatient Hospital Stay (HOSPITAL_COMMUNITY)
Admission: AD | Admit: 2022-02-10 | Discharge: 2022-02-10 | Disposition: A | Discharge status: Home / Self Care | Payer: No Typology Code available for payment source | Attending: Obstetrics & Gynecology | Admitting: Obstetrics & Gynecology

## 2022-02-10 ENCOUNTER — Inpatient Hospital Stay (HOSPITAL_COMMUNITY): Payer: No Typology Code available for payment source

## 2022-02-10 DIAGNOSIS — O209 Hemorrhage in early pregnancy, unspecified: Secondary | ICD-10-CM | POA: Insufficient documentation

## 2022-02-10 DIAGNOSIS — R1030 Lower abdominal pain, unspecified: Secondary | ICD-10-CM | POA: Insufficient documentation

## 2022-02-10 DIAGNOSIS — O26891 Other specified pregnancy related conditions, first trimester: Secondary | ICD-10-CM | POA: Insufficient documentation

## 2022-02-10 DIAGNOSIS — Z3491 Encounter for supervision of normal pregnancy, unspecified, first trimester: Secondary | ICD-10-CM

## 2022-02-10 DIAGNOSIS — O200 Threatened abortion: Secondary | ICD-10-CM | POA: Insufficient documentation

## 2022-02-10 DIAGNOSIS — Z3A01 Less than 8 weeks gestation of pregnancy: Secondary | ICD-10-CM | POA: Diagnosis not present

## 2022-02-10 LAB — URINALYSIS, ROUTINE W REFLEX MICROSCOPIC
Bilirubin Urine: NEGATIVE
Glucose, UA: NEGATIVE mg/dL
Ketones, ur: NEGATIVE mg/dL
Leukocytes,Ua: NEGATIVE
Nitrite: NEGATIVE
Protein, ur: 100 mg/dL — AB
RBC / HPF: >50 RBC/hpf — ABNORMAL HIGH (ref 0–5)
Specific Gravity, Urine: 1.021 (ref 1.005–1.030)
pH: 5 (ref 5.0–8.0)

## 2022-02-10 LAB — CBC
HCT: 37.2 % (ref 36.0–46.0)
Hemoglobin: 11.8 g/dL — ABNORMAL LOW (ref 12.0–15.0)
MCH: 26.6 pg (ref 26.0–34.0)
MCHC: 31.7 g/dL (ref 30.0–36.0)
MCV: 83.8 fL (ref 80.0–100.0)
Platelets: 241 10*3/uL (ref 150–400)
RBC: 4.44 MIL/uL (ref 3.87–5.11)
RDW: 19.9 % — ABNORMAL HIGH (ref 11.5–15.5)
WBC: 5.3 10*3/uL (ref 4.0–10.5)
nRBC: 0 % (ref 0.0–0.2)

## 2022-02-10 LAB — WET PREP, GENITAL
Sperm: NONE SEEN
Trich, Wet Prep: NONE SEEN
WBC, Wet Prep HPF POC: <10 (ref <10)
Yeast Wet Prep HPF POC: NONE SEEN

## 2022-02-10 LAB — ABO/RH: ABO/RH(D): O POS

## 2022-02-10 LAB — HCG, QUANTITATIVE, PREGNANCY: hCG, Beta Chain, Quant, S: 498 m[IU]/mL — ABNORMAL HIGH (ref <5)

## 2022-02-10 MED ORDER — AMLODIPINE BESYLATE 5 MG PO TABS
5.0000 mg | ORAL_TABLET | Freq: Once | ORAL | Status: AC
  Administered 2022-02-10: 5 mg via ORAL
  Filled 2022-02-10: qty 1

## 2022-02-10 MED ORDER — LABETALOL HCL 100 MG PO TABS
100.0000 mg | ORAL_TABLET | Freq: Once | ORAL | Status: AC
  Administered 2022-02-10: 100 mg via ORAL
  Filled 2022-02-10: qty 1

## 2022-02-10 NOTE — Discharge Instructions (Signed)

## 2022-02-10 NOTE — MAU Note (Signed)
Diana Roman is a 36 y.o. at [redacted]w[redacted]d here in MAU reporting: last night saw some brown discharge in her panties. This AM bleeding was more red. Bleeding seems to be increasing. No blood clots. Is having some cramping.   Onset of complaint: last night  Pain score: 5/10  Vitals:   02/10/22 1839  BP: (!) 154/106  Pulse: 75  Resp: 18  Temp: 98 F (36.7 C)  SpO2: 99%     Lab orders placed from triage: ua

## 2022-02-10 NOTE — MAU Provider Note (Cosign Needed Addendum)
History     CSN: [id number]  Arrival date and time: 02/10/22 1810   Event Date/Time   First Provider Initiated Contact with Patient 02/10/22 1858      Chief Complaint  Patient presents with   Abdominal Pain   Vaginal Bleeding   HPI  Diana Roman is a 36 y.o. G2P1001 at [redacted]w[redacted]d who presents for evaluation of vaginal bleeding. Patient reports it started as dark brown and has gotten gradually more red and heavy throughout the day. She reports a small amount of lower abdominal cramping. Patient rates the pain as a 5/10 and has not tried anything for the pain. Denies any constipation, diarrhea or any urinary complaints.  She has CHTN and has not taken her home medication. She denies any HA, chest pain or shortness of breath.   OB History     Gravida  2   Para  1   Term  1   Preterm  0   AB  0   Living  1      SAB  0   IAB  0   Ectopic  0   Multiple  0   Live Births  1           Past Medical History:  Diagnosis Date   Abnormal Pap smear 2011   Anxiety    Bipolar 1 disorder (HCC)    Depression    on meds, doing well   H/O bacterial infection    H/O varicella    Headache(784.0)    Frequent   Hypertension    Infection    UTI   Migraine    No pertinent past medical history    Sickle cell trait (HCC)    Vaginal Pap smear, abnormal    repeat was normal   Yeast infection     Past Surgical History:  Procedure Laterality Date   DENTAL SURGERY      Family History  Problem Relation Age of Onset   Hypertension Mother    Hypertension Sister    Diabetes Maternal Grandmother    Glaucoma Maternal Grandmother     Social History   Tobacco Use   Smoking status: Never   Smokeless tobacco: Never  Vaping Use   Vaping Use: Never used  Substance Use Topics   Alcohol use: Not Currently    Comment: 2/wk   Drug use: No    Allergies: No Known Allergies  No medications prior to admission.    Review of Systems  Constitutional: Negative.  Negative  for fatigue and fever.  HENT: Negative.    Respiratory: Negative.  Negative for shortness of breath.   Cardiovascular: Negative.  Negative for chest pain.  Gastrointestinal:  Positive for abdominal pain. Negative for constipation, diarrhea, nausea and vomiting.  Genitourinary:  Positive for vaginal bleeding. Negative for dysuria and vaginal discharge.  Neurological: Negative.  Negative for dizziness and headaches.   Physical Exam   Blood pressure (!) 146/100, pulse 79, temperature 98 F (36.7 C), temperature source Oral, resp. rate 18, height 5\' 6"  (1.676 m), weight 105.4 kg, last menstrual period 12/22/2021, SpO2 99 %.  Patient Vitals for the past 24 hrs:  BP Temp Temp src Pulse Resp SpO2 Height Weight  02/10/22 1950 (!) 146/100 -- -- 79 -- -- -- --  02/10/22 1900 (!) 147/101 -- -- 84 -- -- -- --  02/10/22 1839 (!) 154/106 98 F (36.7 C) Oral 75 18 99 % -- --  02/10/22 1836 -- -- -- -- -- --  5\' 6"  (1.676 m) 105.4 kg    Physical Exam Vitals and nursing note reviewed.  Constitutional:      General: She is not in acute distress.    Appearance: She is well-developed.  HENT:     Head: Normocephalic.  Eyes:     Pupils: Pupils are equal, round, and reactive to light.  Cardiovascular:     Rate and Rhythm: Normal rate and regular rhythm.     Heart sounds: Normal heart sounds.  Pulmonary:     Effort: Pulmonary effort is normal. No respiratory distress.     Breath sounds: Normal breath sounds.  Abdominal:     General: Bowel sounds are normal. There is no distension.     Palpations: Abdomen is soft.     Tenderness: There is no abdominal tenderness.  Skin:    General: Skin is warm and dry.  Neurological:     Mental Status: She is alert and oriented to person, place, and time.  Psychiatric:        Mood and Affect: Mood normal.        Behavior: Behavior normal.        Thought Content: Thought content normal.        Judgment: Judgment normal.      MAU Course   Procedures  Results for orders placed or performed during the hospital encounter of 02/10/22 (from the past 24 hour(s))  CBC     Status: Abnormal   Collection Time: 02/10/22  6:19 PM  Result Value Ref Range   WBC 5.3 4.0 - 10.5 K/uL   RBC 4.44 3.87 - 5.11 MIL/uL   Hemoglobin 11.8 (L) 12.0 - 15.0 g/dL   HCT 02/12/22 36.1 - 44.3 %   MCV 83.8 80.0 - 100.0 fL   MCH 26.6 26.0 - 34.0 pg   MCHC 31.7 30.0 - 36.0 g/dL   RDW 15.4 (H) 00.8 - 67.6 %   Platelets 241 150 - 400 K/uL   nRBC 0.0 0.0 - 0.2 %  hCG, quantitative, pregnancy     Status: Abnormal   Collection Time: 02/10/22  6:19 PM  Result Value Ref Range   hCG, Beta Chain, Quant, S 498 (H) <5 mIU/mL  ABO/Rh     Status: None   Collection Time: 02/10/22  6:19 PM  Result Value Ref Range   ABO/RH(D) O POS    No rh immune globuloin      NOT A RH IMMUNE GLOBULIN CANDIDATE, PT RH POSITIVE Performed at [facility], [address] [place], [place]   Wet prep, genital     Status: Abnormal   Collection Time: 02/10/22  7:02 PM   Specimen: PATH Cytology Cervicovaginal Ancillary Only  Result Value Ref Range   Yeast Wet Prep HPF POC NONE SEEN NONE SEEN   Trich, Wet Prep NONE SEEN NONE SEEN   Clue Cells Wet Prep HPF POC PRESENT (A) NONE SEEN   WBC, Wet Prep HPF POC <10 <10   Sperm NONE SEEN   Urinalysis, Routine w reflex microscopic Urine, Clean Catch     Status: Abnormal   Collection Time: 02/10/22  7:44 PM  Result Value Ref Range   Color, Urine AMBER (A) YELLOW   APPearance HAZY (A) CLEAR   Specific Gravity, Urine 1.021 1.005 - 1.030   pH 5.0 5.0 - 8.0   Glucose, UA NEGATIVE NEGATIVE mg/dL   Hgb urine dipstick LARGE (A) NEGATIVE   Bilirubin Urine NEGATIVE NEGATIVE   Ketones, ur  NEGATIVE NEGATIVE mg/dL   Protein, ur 100 (A) NEGATIVE mg/dL   Nitrite NEGATIVE NEGATIVE   Leukocytes,Ua NEGATIVE NEGATIVE   RBC / HPF >50 (H) 0 - 5 RBC/hpf   WBC, UA 11-20 0 - 5 WBC/hpf   Bacteria, UA RARE (A) NONE SEEN   Squamous  Epithelial / LPF 11-20 0 - 5   Mucus PRESENT    Non Squamous Epithelial 6-10 (A) NONE SEEN     US OB LESS THAN 14 WEEKS WITH OB TRANSVAGINAL  Result Date: 02/10/2022 CLINICAL DATA:  Abdominal pain and vaginal bleeding. Estimated gestational age of [redacted] weeks, 1 day by LMP. EXAM: OBSTETRIC <14 WK [place] AND TRANSVAGINAL OB US TECHNIQUE: Both transabdominal and transvaginal ultrasound examinations were performed for complete evaluation of the gestation as well as the maternal uterus, adnexal regions, and pelvic cul-de-sac. Transvaginal technique was performed to assess early pregnancy. COMPARISON:  Pelvic ultrasound dated November 27, 2014. FINDINGS: Intrauterine gestational sac: Single. Yolk sac:  Not Visualized. Embryo:  Not Visualized. MSD: 5.7 mm   5 w   2  d Subchorionic hemorrhage:  None visualized. Maternal uterus/adnexae: 2.3 cm right anterior uterine fibroid. The ovaries are unremarkable. IMPRESSION: 1. Probable early intrauterine gestational sac, but no yolk sac, fetal pole, or cardiac activity yet visualized. Recommend follow-up quantitative B-HCG levels and follow-up US in 14 days to assess viability. This recommendation follows SRU consensus guidelines: Diagnostic Criteria for Nonviable Pregnancy Early in the First Trimester. [employer] Med 2013; 532:9924-26. Electronically Signed   By: Titus Dubin M.D.   On: 02/10/2022 19:51     MDM Labs ordered and reviewed.   UA, UPT CBC, HCG ABO/Rh- O Pos Wet prep and gc/chlamydia US OB Comp Less 14 weeks with Transvaginal  Labetalol and norvasc given in MAU  CNM independently reviewed the imaging ordered. Imaging show gestational sac with no yolk sac or fetal pole.  Discussed findings and recommendation for repeat ultrasound in 10-14 days. Patient agreeable to plan of care. Appointment made for 10/10.   Assessment and Plan   1. Normal intrauterine pregnancy on prenatal ultrasound in first trimester   2. Threatened miscarriage   3. [redacted] weeks gestation  of pregnancy   4. Vaginal bleeding affecting early pregnancy   5. Chronic HTN   -Discharge home in stable condition -Vaginal bleeding and pain precautions discussed -Patient advised to follow-up with [facility] on 10/10 for repeat ultrasound - pt to take home BP medications regularly -Patient may return to MAU as needed or if her condition were to change or worsen  Wende Mott, CNM 02/10/2022, 8:38 PM

## 2022-02-11 ENCOUNTER — Telehealth: Payer: Self-pay | Admitting: Family Medicine

## 2022-02-11 LAB — GC/CHLAMYDIA PROBE AMP (~~LOC~~) NOT AT ARMC
Chlamydia: NEGATIVE
Comment: NEGATIVE
Comment: NORMAL
Neisseria Gonorrhea: NEGATIVE

## 2022-02-11 MED ORDER — LABETALOL HCL 200 MG PO TABS: 200.0000 mg | ORAL_TABLET | Freq: Two times a day (BID) | ORAL | 0 refills | Status: DC

## 2022-02-11 NOTE — Telephone Encounter (Signed)
Pt called to request a refill of the following:  labetalol (NORMODYNE) 200 MG tablet  LOV:  9/12823  Pt has a TOC scheduled with Dr. Volanda Napoleon on 02/18/22.  Please advise. [id number] [place], [place] Phone:  [phone]  Fax:  [phone]

## 2022-02-11 NOTE — Telephone Encounter (Signed)
Sent in 30 day supply. 

## 2022-02-18 ENCOUNTER — Encounter: Payer: No Typology Code available for payment source | Admitting: Family Medicine

## 2022-02-22 ENCOUNTER — Institutional Professional Consult (permissible substitution): Payer: No Typology Code available for payment source | Admitting: Neurology

## 2022-02-22 ENCOUNTER — Telehealth: Payer: Self-pay | Admitting: Neurology

## 2022-02-22 NOTE — Telephone Encounter (Signed)
Pt was scheduled for a sleep consult today. Arrived on time for check in. MD was running behind and it was 11:20 am and the patient went to front desk to reschedule. She worked night shift and was tried and could no longer wait to be seen. Pt will reschedule. Pt can be with Dr Brett Fairy or Dr Rexene Alberts whichever has opening because she is a sleep consult and has not been established with anyone yet. Pt accepted 02/25/2022 at 7:45 am with check in of 7:15 am with Dr Rexene Alberts

## 2022-02-23 ENCOUNTER — Ambulatory Visit (INDEPENDENT_AMBULATORY_CARE_PROVIDER_SITE_OTHER): Payer: No Typology Code available for payment source

## 2022-02-23 ENCOUNTER — Other Ambulatory Visit: Payer: Self-pay

## 2022-02-23 ENCOUNTER — Other Ambulatory Visit: Payer: Self-pay | Admitting: *Deleted

## 2022-02-23 DIAGNOSIS — O200 Threatened abortion: Secondary | ICD-10-CM

## 2022-02-23 DIAGNOSIS — O3680X0 Pregnancy with inconclusive fetal viability, not applicable or unspecified: Secondary | ICD-10-CM | POA: Diagnosis not present

## 2022-02-23 DIAGNOSIS — O039 Complete or unspecified spontaneous abortion without complication: Secondary | ICD-10-CM

## 2022-02-23 NOTE — Progress Notes (Signed)
   GYNECOLOGY OFFICE VISIT NOTE  History:   Diana Roman is a 36 y.o. G2P1001 here today for ultrasound results.  Ultrasound shows previously seen IUP no longer seen on ultrasound. Patient states she has had bleeding like a period since she was seen in MAU on 9/27. She is no longer bleeding or having any pain.  Abdominal pain: No   Vaginal bleeding: No    OB History  Gravida Para Term Preterm AB Living  2 1 1  0 0 1  SAB IAB Ectopic Multiple Live Births  0 0 0 0 1    # Outcome Date GA Lbr Len/2nd Weight Sex Delivery Anes PTL Lv  2 Current           1 Term 04/09/06    F Vag-Spont  N LIV     Health Maintenance Due  Topic Date Due   COVID-19 Vaccine (1) Never done   HIV Screening  Never done   INFLUENZA VACCINE  12/15/2021    Past Medical History:  Diagnosis Date   Abnormal Pap smear 2011   Anxiety    Bipolar 1 disorder (HCC)    Depression    on meds, doing well   H/O bacterial infection    H/O varicella    Headache(784.0)    Frequent   Hypertension    Infection    UTI   Migraine    No pertinent past medical history    Sickle cell trait ([place])    Vaginal Pap smear, abnormal    repeat was normal   Yeast infection     Past Surgical History:  Procedure Laterality Date   DENTAL SURGERY      The following portions of the patient's history were reviewed and updated as appropriate: allergies, current medications, past family history, past medical history, past social history, past surgical history and problem list.   Review of Systems:  Pertinent items noted in HPI and remainder of comprehensive ROS otherwise negative.  Physical Exam:  LMP 12/22/2021 (Exact Date) Comment: last day was 12/29/21 BP 144/89, P 110, Temp 97.8 CONSTITUTIONAL: Well-developed, well-nourished female in no acute distress.  HEENT:  Normocephalic, atraumatic. External right and left ear normal. No scleral icterus.  NECK: Normal range of motion, supple, no masses noted on observation SKIN:  No rash noted. Not diaphoretic. No erythema. No pallor. MUSCULOSKELETAL: Normal range of motion. No edema noted. NEUROLOGIC: Alert and oriented to person, place, and time. Normal muscle tone coordination.  PSYCHIATRIC: Normal mood and affect. Normal behavior. Normal judgment and thought content. RESPIRATORY: Effort normal, no problems with respiration noted  Assessment and Plan:  Nonviable Pregnancy - O02.89  Ultrasound shows complete miscarriage. No evidence of previously seen IUP  CNM informed patient of results of ultrasound showing missed AB. Condolences provided and space given for patient to process emotions. Discussed repeating HCG today and trending to normal. CNM will call patient with results of HCG done today.   Patient states she is not planning another pregnancy at this time. Offered contraception and patient declines at this time. Will call office if she desires contraception. Patient reports she has gotten a refill of her medication and does not need any antihypertensives at this time.  Please refer to After Visit Summary for other counseling recommendations.   Total face-to-face time with patient: 20 minutes.  Over 50% of encounter was spent on counseling and coordination of care.   Wende Mott, [place] for [employer], [place]

## 2022-02-24 LAB — BETA HCG QUANT (REF LAB): hCG Quant: <1 m[IU]/mL

## 2022-02-25 ENCOUNTER — Encounter: Payer: Self-pay | Admitting: Neurology

## 2022-02-25 ENCOUNTER — Encounter: Payer: No Typology Code available for payment source | Admitting: Family Medicine

## 2022-02-25 ENCOUNTER — Ambulatory Visit (INDEPENDENT_AMBULATORY_CARE_PROVIDER_SITE_OTHER): Payer: No Typology Code available for payment source | Admitting: Neurology

## 2022-02-25 VITALS — BP 146/103 | HR 70 | Ht 66.0 in | Wt 235.2 lb

## 2022-02-25 DIAGNOSIS — G4730 Sleep apnea, unspecified: Secondary | ICD-10-CM

## 2022-02-25 DIAGNOSIS — E669 Obesity, unspecified: Secondary | ICD-10-CM

## 2022-02-25 DIAGNOSIS — R351 Nocturia: Secondary | ICD-10-CM

## 2022-02-25 DIAGNOSIS — R519 Headache, unspecified: Secondary | ICD-10-CM

## 2022-02-25 DIAGNOSIS — R030 Elevated blood-pressure reading, without diagnosis of hypertension: Secondary | ICD-10-CM

## 2022-02-25 DIAGNOSIS — R0681 Apnea, not elsewhere classified: Secondary | ICD-10-CM

## 2022-02-25 DIAGNOSIS — R0683 Snoring: Secondary | ICD-10-CM

## 2022-02-25 DIAGNOSIS — G4719 Other hypersomnia: Secondary | ICD-10-CM

## 2022-02-25 NOTE — Patient Instructions (Signed)

## 2022-02-25 NOTE — Progress Notes (Signed)
Subjective:    Patient ID: Diana Roman is a 36 y.o. female.  HPI    Star Age, MD, PhD [facility] [address], [address] P.O. [place], [place] [address]  Dear Dr. Isaac Bliss,  I saw your patient, Diana Roman, upon your kind request in my sleep clinic today for initial consultation of her sleep disorder, in particular, concern for underlying obstructive sleep apnea.  The patient is unaccompanied today.  As you know, [name] is a 36 year old female with an underlying medical history of, hypertension, migraine headaches, depression, anxiety, sickle cell trait, recent missed AB, and obesity, who reports snoring and excessive daytime somnolence.  She has had elevated blood pressure values.  I reviewed your office note from 01/11/2022.  Her Epworth sleepiness score is 11 out of 24, fatigue severity score is 59 out of 63.  She has a higher blood pressure reading today, she reports that she has not taken her blood pressure medication yet.  She usually takes it when she gets home after work.  She reports 3 hour shifts, third shift, from 7 PM to 7 AM.  She works as a Chartered certified accountant at [employer].  She lives with her boyfriend and 59 year old daughter.  She has been told that she has pauses in her breathing and loud snoring.  She does not have a known family history of sleep apnea but mom snores loudly.  She is a non-smoker and drinks alcohol in the form of wine, 1 or 2 glasses, about twice a week.  She does not drink caffeine daily.  She has woken up with headaches and tends to have a headache when her blood pressure is up.  She does take Tylenol for her headache.  She typically goes to bed around 8:30 AM and sleeps till 2 PM.  On nonwork days she is still in bed around 5 AM and sleeps till 1 or 2 PM.  She has a TV on in her bedroom but has it on a sleep timer.  They have 1 dog in the household, the dog does not sleep in the bedroom with them.  She has to get up to  use the restroom once she goes to bed, about once or twice usually.  Her Past Medical History Is Significant For: Past Medical History:  Diagnosis Date   Abnormal Pap smear 2011   Anxiety    Bipolar 1 disorder ([place])    Depression    on meds, doing well   H/O bacterial infection    H/O varicella    Headache(784.0)    Frequent   Hypertension    Infection    UTI   Migraine    No pertinent past medical history    Sickle cell trait (HCC)    Vaginal Pap smear, abnormal    repeat was normal   Yeast infection     Her Past Surgical History Is Significant For: Past Surgical History:  Procedure Laterality Date   DENTAL SURGERY      Her Family History Is Significant For: Family History  Problem Relation Age of Onset   Hypertension Mother    Hypertension Sister    Diabetes Maternal Grandmother    Glaucoma Maternal Grandmother    Sleep apnea Neg Hx     Her Social History Is Significant For: Social History   Socioeconomic History   Marital status: Divorced    Spouse name: Not on file   Number of children: Not on file  Years of education: Not on file   Highest education level: Not on file  Occupational History   Not on file  Tobacco Use   Smoking status: Never   Smokeless tobacco: Never  Vaping Use   Vaping Use: Never used  Substance and Sexual Activity   Alcohol use: Not Currently    Alcohol/week: 2.0 standard drinks of alcohol    Types: 2 Glasses of wine per week    Comment: 2/wk   Drug use: No   Sexual activity: Yes    Birth control/protection: None  Other Topics Concern   Not on file  Social History Narrative   Not on file   Social Determinants of Health   Financial Resource Strain: Not on file  Food Insecurity: Not on file  Transportation Needs: Not on file  Physical Activity: Not on file  Stress: Not on file  Social Connections: Not on file    Her Allergies Are:  No Known Allergies:   Her Current Medications Are:  Outpatient Encounter  Medications as of 02/25/2022  Medication Sig   acetaminophen (TYLENOL) 325 MG tablet Take 2 tablets (650 mg total) by mouth every 6 (six) hours as needed.   ALPRAZolam (XANAX) 1 MG tablet 1/2 TO 1 PO QD PRN   amLODipine (NORVASC) 5 MG tablet Take 1 tablet (5 mg total) by mouth daily.   ibuprofen (ADVIL) 800 MG tablet Take 1 tablet (800 mg total) by mouth every 8 (eight) hours as needed (pain).   labetalol (NORMODYNE) 200 MG tablet Take 1 tablet (200 mg total) by mouth 2 (two) times daily.   lamoTRIgine (LAMICTAL) 25 MG tablet Take 25 mg by mouth daily.   LATUDA 20 MG TABS tablet TAKE 1 TABLET BY MOUTH DAILY WITH BREAKFAST   lurasidone (LATUDA) 80 MG TABS tablet Take 1 tablet (80 mg total) by mouth every evening with a full meal   metroNIDAZOLE (FLAGYL) 500 MG tablet Take 1 tablet (500 mg total) by mouth 2 (two) times daily.   ondansetron (ZOFRAN-ODT) 4 MG disintegrating tablet Take 1 tablet (4 mg total) by mouth every 8 (eight) hours as needed for nausea or vomiting.   Prenatal Multivit-Min-Fe-FA (PRENATAL/IRON) TABS Take as directed per bottle instructions.   Vitamin D, Ergocalciferol, (DRISDOL) 1.25 MG (50000 UNIT) CAPS capsule Take 1 capsule (50,000 Units total) by mouth every 7 (seven) days.   No facility-administered encounter medications on file as of 02/25/2022.  :   Review of Systems:  Out of a complete 14 point review of systems, all are reviewed and negative with the exception of these symptoms as listed below:   Review of Systems  Neurological:        Pt here for sleep consult  Pt snores ,fatigue ,headaches,hypertension Pt denies sleep study,CPAP machine     ESS:11 FSS:59    Objective:  Neurological Exam  Physical Exam Physical Examination:   Vitals:   02/25/22 0750  BP: (!) 146/103  Pulse: 70    General Examination: The patient is a very pleasant 36 y.o. female in no acute distress. She appears well-developed and well-nourished and well groomed.   HEENT:  Normocephalic, atraumatic, pupils are equal, round and reactive to light, extraocular tracking is good without limitation to gaze excursion or nystagmus noted. Hearing is grossly intact. Face is symmetric with normal facial animation. Speech is clear with no dysarthria noted. There is no hypophonia. There is no lip, neck/head, jaw or voice tremor. Neck is supple with full range of passive and  active motion. There are no carotid bruits on auscultation. Oropharynx exam reveals: mild mouth dryness, good dental hygiene and moderate airway crowding, due to redundant soft palate and small airway entry.  Mallampati class III, elongated tongue, tonsils on the smaller side about 1+ bilaterally.  Tongue protrudes centrally and palate elevates symmetrically.  Neck circumference of 16 inches, mild overbite.  Chest: Clear to auscultation without wheezing, rhonchi or crackles noted.  Heart: S1+S2+0, regular and normal without murmurs, rubs or gallops noted.   Abdomen: Soft, non-tender and non-distended.  Extremities: There is no pitting edema in the distal lower extremities bilaterally.   Skin: Warm and dry without trophic changes noted.   Musculoskeletal: exam reveals no obvious joint deformities.   Neurologically:  Mental status: The patient is awake, alert and oriented in all 4 spheres. Her immediate and remote memory, attention, language skills and fund of knowledge are appropriate. There is no evidence of aphasia, agnosia, apraxia or anomia. Speech is clear with normal prosody and enunciation. Thought process is linear. Mood is normal and affect is normal.  Cranial nerves II - XII are as described above under HEENT exam.  Motor exam: Normal bulk, strength and tone is noted. There is no obvious action or resting tremor.  Fine motor skills and coordination: grossly intact.  Cerebellar testing: No dysmetria or intention tremor. There is no truncal or gait ataxia.  Sensory exam: intact to light touch in the  upper and lower extremities.  Gait, station and balance: She stands easily. No veering to one side is noted. No leaning to one side is noted. Posture is age-appropriate and stance is narrow based. Gait shows normal stride length and normal pace. No problems turning are noted.   Assessment and Plan:  In summary, TOCCARA WIGAL is a very pleasant 36 y.o.-year old female with an underlying medical history of, hypertension, migraine headaches, depression, anxiety, sickle cell trait, recent missed AB, and obesity, whose history and physical exam are concerning for sleep disordered breathing, supporting a current working diagnosis of unspecified sleep apnea, with the main differential diagnoses of obstructive sleep apnea (OSA) versus upper airway resistance syndrome (UARS) versus central sleep apnea (CSA), or mixed sleep apnea. A laboratory attended sleep study is considered gold standard for evaluation of sleep disordered breathing and is recommended at this time and clinically justified.   I had a long chat with the patient about my findings and the diagnosis of sleep apnea, particularly OSA, its prognosis and treatment options. We talked about medical/conservative treatments, surgical interventions and non-pharmacological approaches for symptom control. I explained, in particular, the risks and ramifications of untreated moderate to severe OSA, especially with respect to developing cardiovascular disease down the road, including congestive heart failure (CHF), difficult to treat hypertension, cardiac arrhythmias (particularly A-fib), neurovascular complications including TIA, stroke and dementia. Even type 2 diabetes has, in part, been linked to untreated OSA. Symptoms of untreated OSA may include (but may not be limited to) daytime sleepiness, nocturia (i.e. frequent nighttime urination), memory problems, mood irritability and suboptimally controlled or worsening mood disorder such as depression and/or anxiety,  lack of energy, lack of motivation, physical discomfort, as well as recurrent headaches, especially morning or nocturnal headaches. We talked about the importance of maintaining a healthy lifestyle and striving for healthy weight. In addition, we talked about the importance of striving for and maintaining good sleep hygiene. I recommended the following at this time: sleep study.  I outlined the differences between a laboratory attended sleep study  which is considered more comprehensive and accurate over the option of a home sleep test (HST); the latter may lead to underestimation of sleep disordered breathing in some instances and does not help with diagnosing upper airway resistance syndrome and is not accurate enough to diagnose primary central sleep apnea typically. I explained the different sleep test procedures to the patient in detail and also outlined possible surgical and non-surgical treatment options of OSA, including the use of a pressure airway pressure (PAP) device (ie CPAP, AutoPAP/APAP or BiPAP in certain circumstances), a custom-made dental device (aka oral appliance, which would require a referral to a specialist dentist or orthodontist typically, and is generally speaking not considered a good choice for patients with full dentures or edentulous state), upper airway surgical options, such as traditional UPPP (which is not considered a first-line treatment) or the Inspire device (hypoglossal nerve stimulator, which would involve a referral for consultation with an ENT surgeon, after careful selection, following inclusion criteria). I explained the PAP treatment option to the patient in detail, as this is generally considered first-line treatment.  The patient indicated that she would be willing to try PAP therapy, if the need arises. I explained the importance of being compliant with PAP treatment, not only for insurance purposes but primarily to improve patient's symptoms symptoms, and for the  patient's long term health benefit, including to reduce Her cardiovascular risks longer-term.    She is advised to take her blood pressure medication as soon as possible.  She does not have any new symptoms with her blood pressure elevation.  We will pick up our discussion about the next steps and treatment options after testing.  We will keep her posted as to the test results by phone call and/or MyChart messaging where possible.  We will plan to follow-up in sleep clinic accordingly as well.  I answered all her questions today and the patient was in agreement.   I encouraged her to call with any interim questions, concerns, problems or updates or email [place] through [place].  Generally speaking, sleep test authorizations may take up to 2 weeks, sometimes less, sometimes longer, the patient is encouraged to get in touch with [place] if they do not hear back from the sleep lab staff directly within the next 2 weeks.  Thank you very much for allowing me to participate in the care of this nice patient. If I can be of any further assistance to you please do not hesitate to call me at [phone].  Sincerely,   Star Age, MD, PhD

## 2022-03-08 ENCOUNTER — Ambulatory Visit (INDEPENDENT_AMBULATORY_CARE_PROVIDER_SITE_OTHER): Payer: No Typology Code available for payment source | Admitting: Family Medicine

## 2022-03-08 ENCOUNTER — Encounter: Payer: Self-pay | Admitting: Family Medicine

## 2022-03-08 VITALS — BP 136/92 | HR 87 | Temp 99.4°F | Ht 66.0 in | Wt 232.2 lb

## 2022-03-08 DIAGNOSIS — K0889 Other specified disorders of teeth and supporting structures: Secondary | ICD-10-CM

## 2022-03-08 DIAGNOSIS — I10 Essential (primary) hypertension: Secondary | ICD-10-CM | POA: Diagnosis not present

## 2022-03-08 DIAGNOSIS — Z23 Encounter for immunization: Secondary | ICD-10-CM

## 2022-03-08 DIAGNOSIS — Z8759 Personal history of other complications of pregnancy, childbirth and the puerperium: Secondary | ICD-10-CM

## 2022-03-08 DIAGNOSIS — K047 Periapical abscess without sinus: Secondary | ICD-10-CM

## 2022-03-08 DIAGNOSIS — F419 Anxiety disorder, unspecified: Secondary | ICD-10-CM

## 2022-03-08 DIAGNOSIS — F314 Bipolar disorder, current episode depressed, severe, without psychotic features: Secondary | ICD-10-CM

## 2022-03-08 MED ORDER — AMOXICILLIN 500 MG PO TABS: 500.0000 mg | ORAL_TABLET | Freq: Two times a day (BID) | ORAL | 0 refills | Status: AC

## 2022-03-08 NOTE — Patient Instructions (Signed)
Do not forget to call your dentist to set up an appointment to check on your teeth.  Also call and set up an appointment with Dr. Robina Ade to discuss medication dose.

## 2022-03-08 NOTE — Progress Notes (Signed)
Subjective:    Patient ID: Diana Roman, female    DOB: 02-28-1986, 36 y.o.   MRN: [id number]  Chief Complaint  Patient presents with   Establish Care    BP and miscarriage 9/27.    HPI Patient was seen today for TOC, previously seen by Dr. Hassan Rowan.   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  Patient inquires about allergy to influenza vaccine.  States in the past she received the vaccine and then become sick 2 months later.  HTN: -Diagnosed 15 years ago after birth of daughter. -Currently taking labetalol 200 mg twice daily and Norvasc 5 mg daily -BP this morning at home 160/109 -Patient endorses intermittent headaches -Pt increasing water intake and working on diet changes. -Walking for exercise with her dog. -Referred for sleep study with [facility] (neurology), however awaiting information regarding at home or in person test based on insurance.  History of snoring: -Patient states this is why a referral for sleep study was placed. -Endorses being told she snores and gasps for air at night when sleeping -Patient also notes sounding nasally/congested almost as if she is snoring during the day while awake.  History of miscarriage -Patient endorses positive pregnancy test at the beginning of September.  Was then seen in ED on 02/10/2022 for vaginal bleeding/threatened miscarriage. -Ultrasound miscarriage after patient had bleeding similar to these. -Patient continues to take prenatal vitamins -Patient endorses increased anxiety since the loss. -This is patient's first miscarriage.  History of anxiety and bipolar 1 disorder: -Patient followed by psychiatry, Dr. Lafayette Dragon -Currently prescribed Lamictal and Latuda however not taking consistently. -Patient states she does not like the way the medications make her feel when she takes them on a consistent basis.  States is mentioned to her provider in the past. -Was previously on Xanax but tries not to take the medication 2/2 h/o  an intentional overdose. -Patient endorses increased anxiety since suffering a miscarriage a few weeks ago.  Right-sided facial pain: -Patient endorses wisdom teeth coming up causing pain on right side of mouth.  Also causing facial edema -Pain comes and goes x the last few weeks -Patient having to chew on left side of mouth. -Patient has a regular dentist but has not seen them in in a year. -Patient endorses sensitivity to teeth at baseline.  Allergies: NKDA    Past Medical History:  Diagnosis Date   Abnormal Pap smear 2011   Anxiety    Bipolar 1 disorder (HCC)    Depression    on meds, doing well   H/O bacterial infection    H/O varicella    Headache(784.0)    Frequent   Hypertension    Infection    UTI   Migraine    No pertinent past medical history    Sickle cell trait (HCC)    Vaginal Pap smear, abnormal    repeat was normal   Yeast infection     No Known Allergies  ROS General: Denies fever, chills, night sweats, changes in weight, changes in appetite HEENT: Denies headaches, ear pain, changes in vision, rhinorrhea, sore throat + headaches, dental pain CV: Denies CP, palpitations, SOB, orthopnea Pulm: Denies SOB, cough, wheezing GI: Denies abdominal pain, nausea, vomiting, diarrhea, constipation GU: Denies dysuria, hematuria, frequency, vaginal discharge Msk: Denies muscle cramps, joint pains Neuro: Denies weakness, numbness, tingling Skin: Denies rashes, bruising Psych: Denies depression, anxiety, hallucinations + anxiety, depression     Objective:    Blood pressure (!) 136/92, pulse 87,  temperature 99.4 F (37.4 C), temperature source Oral, height 5\' 6"  (1.676 m), weight 232 lb 3.2 oz (105.3 kg), last menstrual period 12/22/2021, SpO2 94 %.  Gen. Pleasant, well-nourished, in no distress, normal affect   HEENT: /AT, face symmetric, conjunctiva clear, no scleral icterus, PERRLA, EOMI, nares patent without drainage. tenderness of gumline, teeth 1 and  2. pharynx without erythema or exudate.  TMs normal bilaterally. Lungs: no accessory muscle use, CTAB, no wheezes or rales Cardiovascular: RRR, no m/r/g, no peripheral edema Musculoskeletal: No deformities, no cyanosis or clubbing, normal tone Neuro:  A&Ox3, CN II-XII intact, normal gait Skin:  Warm, no lesions/ rash   Wt Readings from Last 3 Encounters:  03/08/22 232 lb 3.2 oz (105.3 kg)  02/25/22 235 lb 3.2 oz (106.7 kg)  02/10/22 232 lb 4.8 oz (105.4 kg)    Lab Results  Component Value Date   WBC 5.3 02/10/2022   HGB 11.8 (L) 02/10/2022   HCT 37.2 02/10/2022   PLT 241 02/10/2022   GLUCOSE 87 07/10/2021   CHOL 163 07/10/2021   TRIG 141.0 07/10/2021   HDL 42.30 07/10/2021   LDLCALC 92 07/10/2021   ALT 17 07/10/2021   AST 21 07/10/2021   NA 137 07/10/2021   K 3.5 07/10/2021   CL 104 07/10/2021   CREATININE 0.93 07/10/2021   BUN 24 (H) 07/10/2021   CO2 30 07/10/2021   TSH 1.66 07/10/2021   HGBA1C 5.8 07/10/2021      03/08/2022    2:10 PM 01/11/2022   10:38 AM 01/11/2022   10:37 AM  Depression screen PHQ 2/9  Decreased Interest 3 1 1   Down, Depressed, Hopeless 3 1 1   PHQ - 2 Score 6 2 2   Altered sleeping 3 3   Tired, decreased energy 3 3   Change in appetite 3 3   Feeling bad or failure about yourself  3 1   Trouble concentrating 3 3   Moving slowly or fidgety/restless 3 1   Suicidal thoughts 0 0   PHQ-9 Score 24 16   Difficult doing work/chores Extremely dIfficult Extremely dIfficult       03/08/2022    2:16 PM  GAD 7 : Generalized Anxiety Score  Nervous, Anxious, on Edge 3  Control/stop worrying 3  Worry too much - different things 3  Trouble relaxing 3  Restless 3  Easily annoyed or irritable 3  Afraid - awful might happen 3  Total GAD 7 Score 21  Anxiety Difficulty Extremely difficult     Assessment/Plan:  Essential hypertension -Elevated -Continue current medication including labetalol 200 mg twice daily and Norvasc 5 mg daily -Continue  lifestyle modifications -Patient encouraged to monitor BP at home and keep a log to bring with her to clinic  Anxiety -GAD-7 score 21 this visit -Patient encouraged to schedule follow-up appointment with Dr. Robina Ade, psychiatry to discuss medication management -Discussed self-care -Given strict precautions  Bipolar 1 disorder, depressed, severe ([place]) -PHQ-9 score 24 this visit -Patient encouraged to schedule follow-up appointment with Dr. Robina Ade to discuss medications -Given strict precautions for continued/worsening symptoms  Dental infection  - Plan: amoxicillin (AMOXIL) 500 MG tablet  Pain, dental -Advised to schedule follow-up with dentist  History of miscarriage -h/o first miscarriage -Continue prenatal vitamins -Discussed setting up counseling appointment -continue f/u with OB/Gyn  Need for influenza vaccination -Influenza vaccine given this visit  F/u in 4-6 wks for HTN and prn for dental discomfort  Grier Mitts, MD

## 2022-03-11 ENCOUNTER — Other Ambulatory Visit: Payer: Self-pay | Admitting: Family Medicine

## 2022-04-14 ENCOUNTER — Encounter: Payer: Self-pay | Admitting: Obstetrics and Gynecology

## 2022-04-26 ENCOUNTER — Telehealth: Payer: Self-pay | Admitting: Neurology

## 2022-04-26 NOTE — Telephone Encounter (Signed)
[facility] no Berkley Harvey req spoke to [place] ref # [id number] on 04/19/22   left VM 04/22/22 [place]

## 2022-05-14 ENCOUNTER — Other Ambulatory Visit: Payer: Self-pay | Admitting: Family Medicine

## 2022-09-09 ENCOUNTER — Encounter: Payer: Self-pay | Admitting: Family Medicine

## 2022-09-09 ENCOUNTER — Ambulatory Visit (INDEPENDENT_AMBULATORY_CARE_PROVIDER_SITE_OTHER): Payer: Medicaid Other | Admitting: Family Medicine

## 2022-09-09 VITALS — BP 154/120 | HR 78 | Temp 99.0°F | Wt 241.6 lb

## 2022-09-09 DIAGNOSIS — R110 Nausea: Secondary | ICD-10-CM | POA: Diagnosis not present

## 2022-09-09 DIAGNOSIS — H66001 Acute suppurative otitis media without spontaneous rupture of ear drum, right ear: Secondary | ICD-10-CM | POA: Diagnosis not present

## 2022-09-09 DIAGNOSIS — I10 Essential (primary) hypertension: Secondary | ICD-10-CM

## 2022-09-09 DIAGNOSIS — R42 Dizziness and giddiness: Secondary | ICD-10-CM

## 2022-09-09 DIAGNOSIS — R519 Headache, unspecified: Secondary | ICD-10-CM

## 2022-09-09 MED ORDER — ONDANSETRON HCL 4 MG PO TABS: 4.0000 mg | ORAL_TABLET | Freq: Three times a day (TID) | ORAL | 0 refills | Status: AC | PRN

## 2022-09-09 MED ORDER — KETOROLAC TROMETHAMINE 30 MG/ML IJ SOLN
30.0000 mg | Freq: Once | INTRAMUSCULAR | Status: AC
  Administered 2022-09-09: 30 mg via INTRAMUSCULAR

## 2022-09-09 MED ORDER — PROMETHAZINE HCL 25 MG/ML IJ SOLN
25.0000 mg | Freq: Once | INTRAMUSCULAR | Status: AC
  Administered 2022-09-09: 25 mg via INTRAVENOUS

## 2022-09-09 MED ORDER — PROMETHAZINE HCL 25 MG PO TABS: 25.0000 mg | ORAL_TABLET | Freq: Once | ORAL | Status: DC

## 2022-09-09 MED ORDER — AMOXICILLIN-POT CLAVULANATE 500-125 MG PO TABS: 1.0000 | ORAL_TABLET | Freq: Two times a day (BID) | ORAL | 0 refills | Status: AC

## 2022-09-09 NOTE — Progress Notes (Signed)
Established Patient Office Visit   Subjective  Patient ID: Diana Roman, female    DOB: [birth year]  Age: 37 y.o. MRN: [id number]  Chief Complaint  Patient presents with   Dizziness    Dizzy, HA, chest pain for 4 days. Chest pain comes and goes, is in upper rt area of chest. Has not been able to drive due to dizziness, does not remember doing anything different before it started. Light makes it worse    Patient is a 37 year old female with pmh sig for HTN, bipolar 1 disorder, miscarriage who is seen for acute concern.  Patient endorses waking up 4 days ago with dizziness, headache.  Patient states pain started as a sharp sensation in back of neck now having pain in frontal area of head.  Patient endorses sensitivity to light, nausea.  Has been able to keep food, fluids, and medication down.  Did not take BP meds this morning.  Patient states Tylenol helps with for 1 hour then pain returns.  Patient feels off balance.  Endorses discomfort in right front of R ear.  Dizziness This is a new problem. The current episode started in the past 7 days. The problem occurs constantly. The problem has been unchanged. Associated symptoms include headaches. The symptoms are aggravated by standing and walking. She has tried acetaminophen and sleep for the symptoms. The treatment provided no relief.      Review of Systems  Neurological:  Positive for dizziness and headaches.   Negative unless stated above    Objective:     BP (!) 154/120 (BP Location: Right Arm, Patient Position: Sitting, Cuff Size: Normal)   Pulse 78   Temp 99 F (37.2 C) (Oral)   Wt 241 lb 9.6 oz (109.6 kg)   LMP 12/22/2021 (Exact Date) Comment: last day was 12/29/21  SpO2 99%   Breastfeeding Unknown   BMI 39.00 kg/m    Physical Exam Constitutional:      Appearance: Normal appearance. She is ill-appearing. She is not toxic-appearing.     Comments: Sitting in exam room with lights out.  Dry heaving.  HENT:     Head:  Normocephalic and atraumatic.     Right Ear: Tenderness present. Tympanic membrane is erythematous and bulging.     Ears:     Comments: L TM full.    Nose: Nose normal.     Mouth/Throat:     Mouth: Mucous membranes are moist.  Eyes:     Comments: Pupils dilated, 6 mm b/l.  Cardiovascular:     Rate and Rhythm: Normal rate and regular rhythm.     Heart sounds: Normal heart sounds. No murmur heard.    No gallop.  Pulmonary:     Effort: Pulmonary effort is normal. No respiratory distress.     Breath sounds: Normal breath sounds. No wheezing, rhonchi or rales.  Skin:    General: Skin is warm and dry.  Neurological:     Mental Status: She is alert and oriented to person, place, and time.      No results found for any visits on 09/09/22.    Assessment & Plan:  Acute suppurative otitis media of right ear without spontaneous rupture of tympanic membrane, recurrence not specified -     Amoxicillin-Pot Clavulanate; Take 1 tablet by mouth in the morning and at bedtime for 7 days.  Dispense: 14 tablet; Refill: 0  Dizziness -     Ondansetron HCl; Take 1 tablet (4 mg total) by mouth every  8 (eight) hours as needed for nausea or vomiting.  Dispense: 20 tablet; Refill: 0  Nausea -     Ondansetron HCl; Take 1 tablet (4 mg total) by mouth every 8 (eight) hours as needed for nausea or vomiting.  Dispense: 20 tablet; Refill: 0 -     Promethazine HCl  Nonintractable headache, unspecified chronicity pattern, unspecified headache type -     Ketorolac Tromethamine  Essential hypertension -uncontrolled due to not taking meds this morning.  Typically controlled. -Patient advised to take medication upon returning home -Continue current medications including labetalol 200 mg twice daily, Norvasc 5 mg daily  Start ABX for AOM.  AOM likely causing dizziness leading to headache.  Given Phenergan and Toradol in clinic.  Zofran as needed at home.  For continued or worsening including inability to keep  anything down symptoms proceed to nearest ED.  Return if symptoms worsen or fail to improve.   Deeann Saint, MD

## 2022-09-09 NOTE — Addendum Note (Signed)
Addended by: Elwin Mocha on: 09/09/2022 10:56 AM   Modules accepted: Orders

## 2022-09-09 NOTE — Patient Instructions (Signed)
A prescription for Zofran and Augmentin (an antibiotic for your ear infection) was sent to your local pharmacy.  For continued or worsened symptoms proceed to nearest Emergency Department.

## 2022-09-27 ENCOUNTER — Telehealth: Payer: Self-pay | Admitting: Neurology

## 2022-09-27 NOTE — Telephone Encounter (Signed)
Patient left a voicemail on my phone stating she would like to schedule her SS..  I checked her profile and she new insurance that is different from last year. She has [facility] [employer]. I started new case and it is pending. Uploaded notes on the portal.

## 2022-09-29 NOTE — Telephone Encounter (Signed)
NPSG- [facility]: [phone] (exp. 09/27/22 to 01/12/23

## 2022-09-29 NOTE — Telephone Encounter (Signed)
Diana Roman reached out to the patient but left Roman voicemail.  If the patient still has Diana Roman she will need to have Roman [id number] since she was last seen last year and [employer] will not pay for it since she hasn't been seen this year.

## 2022-10-25 ENCOUNTER — Ambulatory Visit (INDEPENDENT_AMBULATORY_CARE_PROVIDER_SITE_OTHER): Payer: Medicaid Other | Admitting: Family Medicine

## 2022-10-25 ENCOUNTER — Encounter: Payer: Self-pay | Admitting: Family Medicine

## 2022-10-25 VITALS — BP 138/98 | HR 83 | Temp 99.7°F | Ht 66.0 in | Wt 244.4 lb

## 2022-10-25 DIAGNOSIS — I10 Essential (primary) hypertension: Secondary | ICD-10-CM | POA: Diagnosis not present

## 2022-10-25 DIAGNOSIS — K5909 Other constipation: Secondary | ICD-10-CM

## 2022-10-25 DIAGNOSIS — R1030 Lower abdominal pain, unspecified: Secondary | ICD-10-CM

## 2022-10-25 DIAGNOSIS — M545 Low back pain, unspecified: Secondary | ICD-10-CM | POA: Diagnosis not present

## 2022-10-25 DIAGNOSIS — R109 Unspecified abdominal pain: Secondary | ICD-10-CM | POA: Diagnosis not present

## 2022-10-25 LAB — POCT URINALYSIS DIPSTICK
Bilirubin, UA: NEGATIVE
Blood, UA: NEGATIVE
Glucose, UA: NEGATIVE
Ketones, UA: NEGATIVE
Leukocytes, UA: NEGATIVE
Nitrite, UA: NEGATIVE
Protein, UA: NEGATIVE
Spec Grav, UA: >=1.03 — AB (ref 1.010–1.025)
Urobilinogen, UA: 0.2 E.U./dL
pH, UA: 5.5 (ref 5.0–8.0)

## 2022-10-25 MED ORDER — SENNOSIDES-DOCUSATE SODIUM 8.6-50 MG PO TABS: 1.0000 | ORAL_TABLET | Freq: Every day | ORAL | 0 refills | Status: DC

## 2022-10-25 MED ORDER — AMLODIPINE BESYLATE 5 MG PO TABS: 5.0000 mg | ORAL_TABLET | Freq: Every day | ORAL | 1 refills | Status: DC

## 2022-10-25 NOTE — Progress Notes (Signed)
Established Patient Office Visit   Subjective  Patient ID: TANZANIA BASHAM, female    DOB: 09-Jan-1986  Age: 37 y.o. MRN: [id number]  Chief Complaint  Patient presents with   Abdominal Pain    Pt c/o lower abdomen pain started on Friday on the R side. Today on L side and feels tender on L lower back.     Pt s is a 37 yo female seen for acute concern x 3 days.  Pt had R groin pain Saturday x 1 day that resolved.  The next day, pt developed continuous sharp L groin pain.  Pain started when she woke up.  While at work later that evening pt developed L lower back pressure.  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  Denies pain in back that moves, n/v, suprapubic pressure, diarrhea, increased flatus.  Pt notes h/o constipation.  Typically has a BM q 2-3 days.  Strains.  Produces small pieces of stool.  Bowels are better with increased water intake or if eats cabbage.  In the past miralax caused stool leakage after sneezing.    Past Medical History:  Diagnosis Date   Abnormal Pap smear 2011   Anxiety    Bipolar 1 disorder (HCC)    Depression    on meds, doing well   H/O bacterial infection    H/O varicella    Headache(784.0)    Frequent   Hypertension    Infection    UTI   Migraine    No pertinent past medical history    Sickle cell trait (HCC)    Vaginal Pap smear, abnormal    repeat was normal   Yeast infection    Past Surgical History:  Procedure Laterality Date   DENTAL SURGERY     Social History   Tobacco Use   Smoking status: Never   Smokeless tobacco: Never  Vaping Use   Vaping Use: Never used  Substance Use Topics   Alcohol use: Not Currently    Alcohol/week: 2.0 standard drinks of alcohol    Types: 2 Glasses of wine per week    Comment: 2/wk   Drug use: No   Family History  Problem Relation Age of Onset   Hypertension Mother    Hypertension Sister    Diabetes Maternal Grandmother    Glaucoma Maternal Grandmother    Sleep apnea Neg Hx    No Known Allergies     ROS Negative unless stated above    Objective:     BP (!) 138/98 (BP Location: Left Arm, Patient Position: Sitting, Cuff Size: Large)   Pulse 83   Temp 99.7 F (37.6 C) (Oral)   Ht 5\' 6"  (1.676 m)   Wt 244 lb 6.4 oz (110.9 kg)   LMP 09/30/2022 (Exact Date) Comment: last day was 12/29/21  SpO2 97%   Breastfeeding No   BMI 39.45 kg/m    Physical Exam Constitutional:      Appearance: She is well-developed. She is obese.  HENT:     Head: Normocephalic and atraumatic.  Eyes:     Pupils: Pupils are equal, round, and reactive to light.  Cardiovascular:     Rate and Rhythm: Normal rate and regular rhythm.     Heart sounds: Normal heart sounds.  Pulmonary:     Effort: Pulmonary effort is normal.     Breath sounds: Normal breath sounds.  Abdominal:     General: Bowel sounds are decreased.     Palpations: Abdomen is soft. There  is splenomegaly.     Tenderness: There is abdominal tenderness in the suprapubic area and left lower quadrant. There is left CVA tenderness. There is no guarding or rebound.     Hernia: No hernia is present.     Comments: Palpable edge of spleen 1 cm below rib cage.  Skin:    General: Skin is warm and dry.  Neurological:     Mental Status: She is alert and oriented to person, place, and time.      Results for orders placed or performed in visit on 10/25/22  POC Urinalysis Dipstick  Result Value Ref Range   Color, UA yellow    Clarity, UA cloudy    Glucose, UA Negative Negative   Bilirubin, UA negative    Ketones, UA Negative    Spec Grav, UA >=1.030 (A) 1.010 - 1.025   Blood, UA Negative    pH, UA 5.5 5.0 - 8.0   Protein, UA Negative Negative   Urobilinogen, UA 0.2 0.2 or 1.0 E.U./dL   Nitrite, UA Negative    Leukocytes, UA Negative Negative   Appearance     Odor        Assessment & Plan:  Lower abdominal pain -     POCT urinalysis dipstick -     Urine Culture -     Comprehensive metabolic panel -     CBC with  Differential/Platelet -     Urinalysis, Routine w reflex microscopic -     US Abdomen Limited; Future  Left flank pain -     Urine Culture -     Comprehensive metabolic panel -     CBC with Differential/Platelet -     Urinalysis, Routine w reflex microscopic -     US Abdomen Limited; Future  Acute left-sided low back pain without sciatica -     Comprehensive metabolic panel -     CBC with Differential/Platelet -     Urinalysis, Routine w reflex microscopic  Primary hypertension -     amLODIPine Besylate; Take 1 tablet (5 mg total) by mouth daily.  Dispense: 90 tablet; Refill: 1 -     Comprehensive metabolic panel  Chronic constipation -     Comprehensive metabolic panel -     CBC with Differential/Platelet -     Sennosides-Docusate Sodium; Take 1 tablet by mouth daily.  Dispense: 30 tablet; Refill: 0 -     US Abdomen Limited; Future  Discussed possible causes of current abdominal and back pain including UTI, pyelonephritis, renal calculi, constipation, muscle strain.  Mild splenomegaly noted on exam.  UA negative for UTI.  Will obtain micro and urine culture given symptoms.  Obtain labs and ultrasound abdomen to evaluate for intra-abdominal infection.  Start bowel regimen.  Given strict precautions.  Return if symptoms worsen or fail to improve.   Deeann Saint, MD

## 2022-10-26 LAB — COMPREHENSIVE METABOLIC PANEL
ALT: 22 U/L (ref 0–35)
AST: 24 U/L (ref 0–37)
Albumin: 4.1 g/dL (ref 3.5–5.2)
Alkaline Phosphatase: 44 U/L (ref 39–117)
BUN: 17 mg/dL (ref 6–23)
CO2: 24 mEq/L (ref 19–32)
Calcium: 9.2 mg/dL (ref 8.4–10.5)
Chloride: 106 mEq/L (ref 96–112)
Creatinine, Ser: 0.97 mg/dL (ref 0.40–1.20)
GFR: 75.37 mL/min (ref >60.00)
Glucose, Bld: 94 mg/dL (ref 70–99)
Potassium: 3.9 mEq/L (ref 3.5–5.1)
Sodium: 137 mEq/L (ref 135–145)
Total Bilirubin: 0.5 mg/dL (ref 0.2–1.2)
Total Protein: 7.2 g/dL (ref 6.0–8.3)

## 2022-10-26 LAB — URINE CULTURE
MICRO NUMBER:: 15062214
Result:: NO GROWTH
SPECIMEN QUALITY:: ADEQUATE

## 2022-10-26 LAB — CBC WITH DIFFERENTIAL/PLATELET
Basophils Absolute: 0.1 10*3/uL (ref 0.0–0.1)
Basophils Relative: 1 % (ref 0.0–3.0)
Eosinophils Absolute: 0.1 10*3/uL (ref 0.0–0.7)
Eosinophils Relative: 1.9 % (ref 0.0–5.0)
HCT: 36.1 % (ref 36.0–46.0)
Hemoglobin: 11.5 g/dL — ABNORMAL LOW (ref 12.0–15.0)
Lymphocytes Relative: 26.1 % (ref 12.0–46.0)
Lymphs Abs: 1.4 10*3/uL (ref 0.7–4.0)
MCHC: 31.8 g/dL (ref 30.0–36.0)
MCV: 89.2 fl (ref 78.0–100.0)
Monocytes Absolute: 0.4 10*3/uL (ref 0.1–1.0)
Monocytes Relative: 8 % (ref 3.0–12.0)
Neutro Abs: 3.4 10*3/uL (ref 1.4–7.7)
Neutrophils Relative %: 63 % (ref 43.0–77.0)
Platelets: 234 10*3/uL (ref 150.0–400.0)
RBC: 4.05 Mil/uL (ref 3.87–5.11)
RDW: 15.6 % — ABNORMAL HIGH (ref 11.5–15.5)
WBC: 5.4 10*3/uL (ref 4.0–10.5)

## 2022-10-26 LAB — URINALYSIS, ROUTINE W REFLEX MICROSCOPIC
Bilirubin Urine: NEGATIVE
Hgb urine dipstick: NEGATIVE
Ketones, ur: NEGATIVE
Leukocytes,Ua: NEGATIVE
Nitrite: NEGATIVE
RBC / HPF: NONE SEEN (ref 0–?)
Specific Gravity, Urine: >=1.03 — AB (ref 1.000–1.030)
Total Protein, Urine: NEGATIVE
Urine Glucose: NEGATIVE
Urobilinogen, UA: 0.2 (ref 0.0–1.0)
WBC, UA: NONE SEEN (ref 0–?)
pH: 6 (ref 5.0–8.0)

## 2022-10-28 ENCOUNTER — Ambulatory Visit (HOSPITAL_BASED_OUTPATIENT_CLINIC_OR_DEPARTMENT_OTHER)
Admission: RE | Admit: 2022-10-28 | Discharge: 2022-10-28 | Disposition: A | Discharge status: Home / Self Care | Payer: Medicaid Other | Source: Ambulatory Visit | Attending: Family Medicine | Admitting: Family Medicine

## 2022-10-28 DIAGNOSIS — R1030 Lower abdominal pain, unspecified: Secondary | ICD-10-CM | POA: Insufficient documentation

## 2022-10-28 DIAGNOSIS — R109 Unspecified abdominal pain: Secondary | ICD-10-CM | POA: Diagnosis present

## 2022-10-28 DIAGNOSIS — K5909 Other constipation: Secondary | ICD-10-CM | POA: Diagnosis present

## 2023-01-12 ENCOUNTER — Other Ambulatory Visit: Payer: Self-pay | Admitting: Family Medicine

## 2023-01-12 DIAGNOSIS — K5909 Other constipation: Secondary | ICD-10-CM

## 2023-01-12 DIAGNOSIS — R42 Dizziness and giddiness: Secondary | ICD-10-CM

## 2023-01-12 DIAGNOSIS — R110 Nausea: Secondary | ICD-10-CM

## 2023-01-14 MED ORDER — SENNOSIDES-DOCUSATE SODIUM 8.6-50 MG PO TABS: 1.0000 | ORAL_TABLET | Freq: Every day | ORAL | 1 refills | Status: DC

## 2023-02-09 ENCOUNTER — Emergency Department (HOSPITAL_COMMUNITY): Payer: Medicaid Other

## 2023-02-09 ENCOUNTER — Emergency Department (HOSPITAL_COMMUNITY)
Admission: EM | Admit: 2023-02-09 | Discharge: 2023-02-10 | Disposition: A | Discharge status: Home / Self Care | Payer: Medicaid Other

## 2023-02-09 ENCOUNTER — Encounter (HOSPITAL_COMMUNITY): Payer: Self-pay | Admitting: Emergency Medicine

## 2023-02-09 ENCOUNTER — Other Ambulatory Visit: Payer: Self-pay

## 2023-02-09 DIAGNOSIS — Z79899 Other long term (current) drug therapy: Secondary | ICD-10-CM | POA: Diagnosis not present

## 2023-02-09 DIAGNOSIS — R9431 Abnormal electrocardiogram [ECG] [EKG]: Secondary | ICD-10-CM | POA: Diagnosis not present

## 2023-02-09 DIAGNOSIS — R030 Elevated blood-pressure reading, without diagnosis of hypertension: Secondary | ICD-10-CM

## 2023-02-09 DIAGNOSIS — I10 Essential (primary) hypertension: Secondary | ICD-10-CM | POA: Insufficient documentation

## 2023-02-09 DIAGNOSIS — R079 Chest pain, unspecified: Secondary | ICD-10-CM | POA: Diagnosis present

## 2023-02-09 DIAGNOSIS — R072 Precordial pain: Secondary | ICD-10-CM

## 2023-02-09 LAB — CBC
HCT: 34.9 % — ABNORMAL LOW (ref 36.0–46.0)
Hemoglobin: 11 g/dL — ABNORMAL LOW (ref 12.0–15.0)
MCH: 27.8 pg (ref 26.0–34.0)
MCHC: 31.5 g/dL (ref 30.0–36.0)
MCV: 88.4 fL (ref 80.0–100.0)
Platelets: 260 10*3/uL (ref 150–400)
RBC: 3.95 MIL/uL (ref 3.87–5.11)
RDW: 16.6 % — ABNORMAL HIGH (ref 11.5–15.5)
WBC: 5 10*3/uL (ref 4.0–10.5)
nRBC: 0 % (ref 0.0–0.2)

## 2023-02-09 LAB — BASIC METABOLIC PANEL
Anion gap: 5 (ref 5–15)
BUN: 20 mg/dL (ref 6–20)
CO2: 23 mmol/L (ref 22–32)
Calcium: 8.9 mg/dL (ref 8.9–10.3)
Chloride: 108 mmol/L (ref 98–111)
Creatinine, Ser: 0.84 mg/dL (ref 0.44–1.00)
GFR, Estimated: >60 mL/min (ref >60)
Glucose, Bld: 112 mg/dL — ABNORMAL HIGH (ref 70–99)
Potassium: 3.5 mmol/L (ref 3.5–5.1)
Sodium: 136 mmol/L (ref 135–145)

## 2023-02-09 LAB — HCG, SERUM, QUALITATIVE: Preg, Serum: NEGATIVE

## 2023-02-09 LAB — TROPONIN I (HIGH SENSITIVITY)
Troponin I (High Sensitivity): 6 ng/L (ref <18)
Troponin I (High Sensitivity): 6 ng/L (ref <18)

## 2023-02-09 MED ORDER — IOHEXOL 350 MG/ML SOLN
75.0000 mL | Freq: Once | INTRAVENOUS | Status: AC | PRN
  Administered 2023-02-09: 80 mL via INTRAVENOUS

## 2023-02-09 MED ORDER — ALUM & MAG HYDROXIDE-SIMETH 200-200-20 MG/5ML PO SUSP
30.0000 mL | Freq: Once | ORAL | Status: AC
  Administered 2023-02-09: 30 mL via ORAL
  Filled 2023-02-09: qty 30

## 2023-02-09 MED ORDER — ONDANSETRON 4 MG PO TBDP
4.0000 mg | ORAL_TABLET | Freq: Once | ORAL | Status: AC
  Administered 2023-02-09: 4 mg via ORAL
  Filled 2023-02-09: qty 1

## 2023-02-09 NOTE — ED Notes (Signed)
Called CT to let them know patient is ready.

## 2023-02-09 NOTE — ED Provider Notes (Signed)
[place] EMERGENCY DEPARTMENT AT [facility] Provider Note   CSN: [id number] Arrival date & time: 02/09/23  1739     History {Add pertinent medical, surgical, social history, OB history to HPI:1} Chief Complaint  Patient presents with   Chest Pain    Diana Roman is a 37 y.o. female.  37 year old female with past medical history of hypertension and BMI greater than 30 presenting to the emergency department today with chest pain.  The patient states that this began 2 days ago.  She states the pain is in the center of her chest but occasionally does radiate to the left shoulder.  She states that when she eats or drinks that it is a burning pain but mostly it is a dull pain.  This is not exertional.  She denies a history of DVT or pulmonary embolism, recent surgeries, recent travel.  The pain is nonpleuritic.  She denies any leg pain or swelling.  She came to the ER today for further evaluation due to these ongoing symptoms.  She states that the pain is gradually worsened over the past 2 days.  She denies any focal weakness, numbness, or tingling.   Chest Pain      Home Medications Prior to Admission medications   Medication Sig Start Date End Date Taking? Authorizing Provider  acetaminophen (TYLENOL) 325 MG tablet Take 2 tablets (650 mg total) by mouth every 6 (six) hours as needed. 09/04/21   Koberlein, Paris Lore, MD  ALPRAZolam (XANAX) 1 MG tablet 1/2 TO 1 PO QD PRN 05/05/21     amLODipine (NORVASC) 5 MG tablet Take 1 tablet (5 mg total) by mouth daily. 10/25/22   Deeann Saint, MD  ibuprofen (ADVIL) 800 MG tablet Take 1 tablet (800 mg total) by mouth every 8 (eight) hours as needed (pain). 06/23/21   Zenia Resides, MD  labetalol (NORMODYNE) 200 MG tablet TAKE 1 TABLET BY MOUTH TWICE A DAY 03/12/22   Deeann Saint, MD  lamoTRIgine (LAMICTAL) 25 MG tablet Take 25 mg by mouth daily.    [provider]  lurasidone (LATUDA) 80 MG TABS tablet Take 1 tablet  (80 mg total) by mouth every evening with a full meal 05/05/21     Multiple Vitamins-Minerals (ONE-A-DAY WOMENS PO) Take by mouth.    [provider]  ondansetron (ZOFRAN) 4 MG tablet Take 1 tablet (4 mg total) by mouth every 8 (eight) hours as needed for nausea or vomiting. 09/09/22   Deeann Saint, MD  senna-docusate (SENOKOT-S) 8.6-50 MG tablet Take 1 tablet by mouth daily. 01/14/23   Deeann Saint, MD      Allergies    Patient has no known allergies.    Review of Systems   Review of Systems  Cardiovascular:  Positive for chest pain.  All other systems reviewed and are negative.   Physical Exam Updated Vital Signs BP (!) 147/107 (BP Location: Left Arm)   Pulse 88   Temp 99.5 F (37.5 C) (Oral)   Resp 18   Ht 5\' 6"  (1.676 m)   Wt 110 kg   LMP 01/26/2023   SpO2 100%   BMI 39.14 kg/m  Physical Exam Vitals and nursing note reviewed.   Gen: NAD Eyes: PERRL, EOMI HEENT: no oropharyngeal swelling Neck: trachea midline Resp: clear to auscultation bilaterally Card: RRR, no murmurs, rubs, or gallops Abd: nontender, nondistended Extremities: no calf tenderness, no edema Vascular: 2+ radial pulses bilaterally, 2+ DP pulses bilaterally Neuro: Equal  strength sensation throughout bilateral upper and lower extremities Skin: no rashes Psyc: acting appropriately   ED Results / Procedures / Treatments   Labs (all labs ordered are listed, but only abnormal results are displayed) Labs Reviewed  BASIC METABOLIC PANEL - Abnormal; Notable for the following components:      Result Value   Glucose, Bld 112 (*)    All other components within normal limits  CBC - Abnormal; Notable for the following components:   Hemoglobin 11.0 (*)    HCT 34.9 (*)    RDW 16.6 (*)    All other components within normal limits  HCG, SERUM, QUALITATIVE  TROPONIN I (HIGH SENSITIVITY)  TROPONIN I (HIGH SENSITIVITY)    EKG EKG Interpretation Date/Time:  Wednesday February 09 2023  17:50:48 EDT Ventricular Rate:  86 PR Interval:  175 QRS Duration:  82 QT Interval:  354 QTC Calculation: 424 R Axis:   82  Text Interpretation: Sinus rhythm Borderline repolarization abnormality Confirmed by Beckey Downing [phone]) on 02/09/2023 8:35:07 PM  Radiology DG Chest 2 View  Result Date: 02/09/2023 CLINICAL DATA:  Chest pain EXAM: CHEST - 2 VIEW COMPARISON:  Chest x-ray 09/29/2018 FINDINGS: The heart size and mediastinal contours are within normal limits. Both lungs are clear. The visualized skeletal structures are unremarkable. IMPRESSION: No active cardiopulmonary disease. Electronically Signed   By: Darliss Cheney M.D.   On: 02/09/2023 19:20    Procedures Procedures  {Document cardiac monitor, telemetry assessment procedure when appropriate:1}  Medications Ordered in ED Medications  alum & mag hydroxide-simeth (MAALOX/MYLANTA) 200-200-20 MG/5ML suspension 30 mL (has no administration in time range)  ondansetron (ZOFRAN-ODT) disintegrating tablet 4 mg (has no administration in time range)    ED Course/ Medical Decision Making/ A&P   {   Click here for ABCD2, HEART and other calculatorsREFRESH Note before signing :1}                              Medical Decision Making 37 year old female with past medical history of hypertension and BMI greater than 30 presents emergency department today with chest pain over the past 48 hours.  The patient's EKG interpreted by me from triage shows a sinus rhythm with normal axis, normal intervals, and T wave inversions in the inferior and lateral leads.  This does appear to be new from her previous EKGs.  Troponins are ordered.  The patient had 2 troponins here that were negative.  The patient is PERC negative so suspicion for pulmonary embolism is low at this time.  She has reassuring neurovascular exam and symptoms have gradually worsened over the past 2 days so suspicion for aortic dissection is low at this time.  Chest x-ray is performed and  is unremarkable.  There is no mediastinal widening to suggest aortic pathology.  I did call and discussed the patient's case with Dr. Lynnette Caffey.  He felt that the patient could  Amount and/or Complexity of Data Reviewed Labs: ordered. Radiology: ordered.  Risk OTC drugs. Prescription drug management.   ***  {Document critical care time when appropriate:1} {Document review of labs and clinical decision tools ie heart score, Chads2Vasc2 etc:1}  {Document your independent review of radiology images, and any outside records:1} {Document your discussion with family members, caretakers, and with consultants:1} {Document social determinants of health affecting pt's care:1} {Document your decision making why or why not admission, treatments were needed:1} Final Clinical Impression(s) / ED Diagnoses Final diagnoses:  None  Rx / DC Orders ED Discharge Orders     None

## 2023-02-09 NOTE — ED Notes (Signed)
Went to go place IV for patient to receive her scan and she asked me why her blood pressure was not being addressed. I asked the patient if she mentioned it to the doctor and if he has spoke to her about it. He said that it was elevated but did not say anything about treating it. Patient seemed frustrated and I mentioned to her I would send a message and ask the provider. Provider was sent a message and made aware.

## 2023-02-09 NOTE — ED Triage Notes (Signed)
Patient c/o chest pain x2 days. Patient report worsening chest pain radiating to her left shoulder. Patient c/o Nausea. Patient denies emesis. Patient a/ox4.

## 2023-02-10 NOTE — ED Notes (Signed)
Patient refused discharge vitals.

## 2023-02-10 NOTE — ED Provider Notes (Signed)
Care assumed at 2300.  Patient with history of hypertension here for evaluation of chest pain.  Troponins are negative x 2.  EKG is abnormal.  Care assumed pending CTA.  CTA is negative for acute abnormality.  Patient's blood pressure is elevated, she did miss her evening medications during her ED stay.  She prefers to take them when she gets home.  Discussed continuing her home medications.  Will place referral to cardiology regarding her EKG, hypertension.   Tilden Fossa, MD 02/10/23 [phone]

## 2023-02-15 ENCOUNTER — Ambulatory Visit: Payer: Medicaid Other | Attending: Cardiology | Admitting: Cardiology

## 2023-02-15 ENCOUNTER — Ambulatory Visit (INDEPENDENT_AMBULATORY_CARE_PROVIDER_SITE_OTHER): Payer: Medicaid Other

## 2023-02-15 ENCOUNTER — Encounter: Payer: Self-pay | Admitting: Cardiology

## 2023-02-15 VITALS — BP 132/86 | HR 94 | Resp 16 | Ht 66.0 in | Wt 241.6 lb

## 2023-02-15 DIAGNOSIS — R002 Palpitations: Secondary | ICD-10-CM

## 2023-02-15 DIAGNOSIS — I110 Hypertensive heart disease with heart failure: Secondary | ICD-10-CM

## 2023-02-15 DIAGNOSIS — R072 Precordial pain: Secondary | ICD-10-CM

## 2023-02-15 DIAGNOSIS — R9431 Abnormal electrocardiogram [ECG] [EKG]: Secondary | ICD-10-CM | POA: Diagnosis not present

## 2023-02-15 MED ORDER — LOSARTAN POTASSIUM 50 MG PO TABS
50.0000 mg | ORAL_TABLET | Freq: Every evening | ORAL | 2 refills | Status: DC
Start: 2023-02-15 — End: 2023-07-18

## 2023-02-15 NOTE — Progress Notes (Signed)
Cardiology Office Note:  .   Date:  02/16/2023  ID:  Diana Roman, DOB 03/20/1986, MRN [id number] PCP: Deeann Saint, MD  [facility] Providers Cardiologist:  Yates Decamp, MD    History of Present Illness: Diana Roman is a 37 y.o. female   Discussed the use of AI scribe software for clinical note transcription with the patient, who gave verbal consent to proceed.  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 The chest pain, described as a knot-like sensation, comes and goes, lasting approximately five minutes each time. The patient reports that the pain often occurs while at rest, and is accompanied by a sensation of the heart racing. The patient also reports experiencing episodes of heart racing while sleeping. The patient describes these episodes as lasting for several minutes and resolving spontaneously. The patient also reports a history of acid reflux and is suspected to have sleep apnea. The patient works night shifts and consumes caffeinated drinks daily, which may be contributing to the symptoms. The patient has tried to stop consuming caffeinated drinks in the past but experienced severe headaches. The patient also reports weight gain over the past year.       Review of Systems  Cardiovascular:  Positive for chest pain and palpitations. Negative for dyspnea on exertion and leg swelling.    Physical Exam Constitutional:      Appearance: She is morbidly obese.  Neck:     Vascular: No carotid bruit or JVD.  Cardiovascular:     Rate and Rhythm: Normal rate and regular rhythm.     Pulses: Intact distal pulses.     Heart sounds: Normal heart sounds. No murmur heard.    No gallop.  Pulmonary:     Effort: Pulmonary effort is normal.     Breath sounds: Normal breath sounds.  Chest:     Chest wall:  Tenderness (upper precordial region) present.  Abdominal:     General: Bowel sounds are normal.     Palpations: Abdomen is soft.  Musculoskeletal:     Right lower leg: No edema.     Left lower leg: No edema.    Risk Assessment/Calculations:       Lab Results  Component Value Date   CHOL 163 07/10/2021   HDL 42.30 07/10/2021   LDLCALC 92 07/10/2021   TRIG 141.0 07/10/2021   CHOLHDL 4 07/10/2021   Lab Results  Component Value Date   NA 136 02/09/2023   K 3.5 02/09/2023   CO2 23 02/09/2023   GLUCOSE 112 (H) 02/09/2023   BUN 20 02/09/2023   CREATININE 0.84 02/09/2023   CALCIUM 8.9 02/09/2023   GFR 75.37 10/25/2022   GFRNONAA >60 02/09/2023   Lab Results  Component Value Date   WBC 5.0 02/09/2023   HGB 11.0 (L) 02/09/2023   HCT 34.9 (L) 02/09/2023   MCV 88.4 02/09/2023   PLT 260 02/09/2023   Physical Exam:   VS:  BP 132/86 (BP Location: Left Arm, Patient Position: Sitting, Cuff Size: Large)   Pulse 94   Resp 16   Ht 5\' 6"  (1.676 m)   Wt 241 lb 9.6 oz (109.6 kg)   LMP 01/26/2023   SpO2 98%   BMI 39.00 kg/m    Wt Readings from Last 3 Encounters:  02/15/23 241 lb 9.6 oz (109.6  kg)  02/09/23 242 lb 8.1 oz (110 kg)  10/25/22 244 lb 6.4 oz (110.9 kg)     Constitutional:      Appearance: Morbidly obese.  Neck:     Vascular: No carotid bruit or JVD.  Pulmonary:     Effort: Pulmonary effort is normal.     Breath sounds: Normal breath sounds.  Chest:     Chest wall: Tender to palpatation (upper precordial region).  Cardiovascular:     Normal rate. Regular rhythm. Normal heart sounds.     No gallop.   Pulses:    Intact distal pulses.  Edema:    Pretibial: no edema of the left pretibial area and no edema of the right pretibial area. Abdominal:     General: Bowel sounds are normal.     Palpations: Abdomen is soft.      Studies Reviewed: Marland Kitchen   EKG Interpretation Date/Time:  Tuesday February 15 2023 15:35:40 EDT Ventricular Rate:  81 PR Interval:  180 QRS  Duration:  76 QT Interval:  358 QTC Calculation: 415 R Axis:   59  Text Interpretation: EKG 02/15/2023: Normal sinus rhythm at the rate of 81 bpm, normal axis, diffuse nonspecific T wave flattening.  Compared to 02/09/2023, inferolateral ST depression no longer present. Confirmed by Delrae Rend [phone]) on 02/15/2023 3:47:37 PM    CT Angio Chest 02/09/2023: Negative for PE, normal heart size.  No significant other abnormalities.  ASSESSMENT AND PLAN: .      ICD-10-CM   1. Precordial pain  R07.2 EKG 12-Lead    ECHOCARDIOGRAM COMPLETE    2. Nonspecific abnormal electrocardiogram (ECG) (EKG)  R94.31 ECHOCARDIOGRAM COMPLETE    3. Hypertensive heart disease with heart failure (HCC)  I11.0 losartan (COZAAR) 50 MG tablet    4. Rapid palpitations  R00.2 LONG TERM MONITOR (3-14 DAYS)    ECHOCARDIOGRAM COMPLETE      Assessment and Plan    Chest Pain and Palpitations Chest discomfort and palpitations occurring simultaneously, predominantly at rest. Symptoms suggestive of possible acid reflux and palpitations potentially related to high caffeine intake.  Patient feels tightness in the middle of the chest and occurs mostly at rest and when she is lying down and sometimes during activity and this may indicate esophageal spasm angina pectoris.  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  EKG abnormality likely due to hypertension (hypertensive heart disease).  If chest pain is persistent after treatment of GERD and OSA, we will consider cardiac stress testing.  -Order 2-week heart monitor to capture episodes of palpitations. -Order echocardiogram to assess heart structure and function. -Encourage reduction of caffeine intake.  Hypertension Despite being on Labetalol and Amlodipine, blood pressure remains uncontrolled. Likely contributing factors include obesity and potential sleep  apnea. -Add Losartan to antihypertensive regimen. -Encourage lifestyle modifications including weight loss and healthy diet.  Obesity BMI close to 40, with recent weight gain of 16 pounds over the past year. Likely contributing to uncontrolled hypertension and potential sleep apnea.  Patient has already been scheduled for sleep study per her PCP. -Encourage weight loss through healthy diet and regular exercise.  Potential Sleep Apnea History of loud snoring and obesity raises suspicion for sleep apnea, which could be contributing to hypertension and cardiac symptoms. -Await results of planned sleep study.  Follow-up in 2 months with PA or NP to assess response to medication changes and lifestyle modifications. Signed,  Yates Decamp, MD, [facility] 02/16/2023, 7:10 AM

## 2023-02-15 NOTE — Patient Instructions (Signed)
Medication Instructions:  Your physician has recommended you make the following change in your medication: Start Losartan 50 mg by mouth daily in the evening  *If you need a refill on your cardiac medications before your next appointment, please call your pharmacy*   Lab Work: none If you have labs (blood work) drawn today and your tests are completely normal, you will receive your results only by: MyChart Message (if you have MyChart) OR A paper copy in the mail If you have any lab test that is abnormal or we need to change your treatment, we will call you to review the results.   Testing/Procedures: Your physician has requested that you have an echocardiogram. Echocardiography is a painless test that uses sound waves to create images of your heart. It provides your doctor with information about the size and shape of your heart and how well your heart's chambers and valves are working. This procedure takes approximately one hour. There are no restrictions for this procedure. Please do NOT wear cologne, perfume, aftershave, or lotions (deodorant is allowed). Please arrive 15 minutes prior to your appointment time.   Dr Jacinto Halim has ordered a 2 week zio monitor.    Follow-Up: At [facility], you and your health needs are our priority.  As part of our continuing mission to provide you with exceptional heart care, we have created designated Provider Care Teams.  These Care Teams include your primary Cardiologist (physician) and Advanced Practice Providers (APPs -  Physician Assistants and Nurse Practitioners) who all work together to provide you with the care you need, when you need it.  We recommend signing up for the patient portal called "MyChart".  Sign up information is provided on this After Visit Summary.  MyChart is used to connect with patients for Virtual Visits (Telemedicine).  Patients are able to view lab/test results, encounter notes, upcoming appointments, etc.   Non-urgent messages can be sent to your provider as well.   To learn more about what you can do with MyChart, go to [email or web address].    Your next appointment:   2 month(s)  Provider:   Jari Favre, PA-C, Ronie Spies, PA-C, Robin Searing, NP, Jacolyn Reedy, PA-C, Eligha Bridegroom, NP, Tereso Newcomer, PA-C, or Perlie Gold, PA-C         Other Instructions Christena Deem- Long Term Monitor Instructions  Your physician has requested you wear a ZIO patch monitor for 14 days.  This is a single patch monitor. [employer] supplies one patch monitor per enrollment. Additional stickers are not available. Please do not apply patch if you will be having a Nuclear Stress Test,  Echocardiogram, Cardiac CT, MRI, or Chest Xray during the period you would be wearing the  monitor. The patch cannot be worn during these tests. You cannot remove and re-apply the  ZIO XT patch monitor.  Your ZIO patch monitor will be mailed 3 day USPS to your address on file. It may take 3-5 days  to receive your monitor after you have been enrolled.  Once you have received your monitor, please review the enclosed instructions. Your monitor  has already been registered assigning a specific monitor serial # to you.  Billing and Patient Assistance Program Information  We have supplied [employer] with any of your insurance information on file for billing purposes. [employer] offers a sliding scale Patient Assistance Program for patients that do not have  insurance, or whose insurance does not completely cover the cost of the ZIO monitor.  You must apply  for the Patient Assistance Program to qualify for this discounted rate.  To apply, please call [employer] at [phone], select option 4, select option 2, ask to apply for  Patient Assistance Program. Meredeth Ide will ask your household income, and how many people  are in your household. They will quote your out-of-pocket cost based on that information.  [employer] will also be able to set up a  36-month, interest-free payment plan if needed.  Applying the monitor   Shave hair from upper left chest.  Hold abrader disc by orange tab. Rub abrader in 40 strokes over the upper left chest as  indicated in your monitor instructions.  Clean area with 4 enclosed alcohol pads. Let dry.  Apply patch as indicated in monitor instructions. Patch will be placed under collarbone on left  side of chest with arrow pointing upward.  Rub patch adhesive wings for 2 minutes. Remove white label marked "1". Remove the white  label marked "2". Rub patch adhesive wings for 2 additional minutes.  While looking in a mirror, press and release button in center of patch. A small green light will  flash 3-4 times. This will be your only indicator that the monitor has been turned on.  Do not shower for the first 24 hours. You may shower after the first 24 hours.  Press the button if you feel a symptom. You will hear a small click. Record Date, Time and  Symptom in the Patient Logbook.  When you are ready to remove the patch, follow instructions on the last 2 pages of Patient  Logbook. Stick patch monitor onto the last page of Patient Logbook.  Place Patient Logbook in the blue and white box. Use locking tab on box and tape box closed  securely. The blue and white box has prepaid postage on it. Please place it in the mailbox as  soon as possible. Your physician should have your test results approximately 7 days after the  monitor has been mailed back to [facility].  Call [facility] Customer Care at [phone] if you have questions regarding  your ZIO XT patch monitor. Call them immediately if you see an orange light blinking on your  monitor.  If your monitor falls off in less than 4 days, contact our Monitor department at [phone].  If your monitor becomes loose or falls off after 4 days call [employer] at [phone] for  suggestions on securing your monitor

## 2023-02-15 NOTE — Progress Notes (Unsigned)
Enrolled patient for a 14 day Zio XT  monitor to be mailed to patients home  °

## 2023-02-22 DIAGNOSIS — R002 Palpitations: Secondary | ICD-10-CM

## 2023-02-25 ENCOUNTER — Other Ambulatory Visit (HOSPITAL_BASED_OUTPATIENT_CLINIC_OR_DEPARTMENT_OTHER): Payer: Self-pay

## 2023-02-25 MED ORDER — ALPRAZOLAM 1 MG PO TABS
0.5000 mg | ORAL_TABLET | Freq: Every day | ORAL | 0 refills | Status: DC | PRN
  Filled 2023-02-25: qty 30, 30d supply, fill #0

## 2023-02-26 ENCOUNTER — Other Ambulatory Visit (HOSPITAL_BASED_OUTPATIENT_CLINIC_OR_DEPARTMENT_OTHER): Payer: Self-pay

## 2023-03-04 ENCOUNTER — Ambulatory Visit (HOSPITAL_COMMUNITY): Payer: Medicaid Other | Attending: Cardiology

## 2023-03-04 DIAGNOSIS — R002 Palpitations: Secondary | ICD-10-CM | POA: Diagnosis not present

## 2023-03-04 DIAGNOSIS — R9431 Abnormal electrocardiogram [ECG] [EKG]: Secondary | ICD-10-CM | POA: Insufficient documentation

## 2023-03-04 DIAGNOSIS — R072 Precordial pain: Secondary | ICD-10-CM | POA: Insufficient documentation

## 2023-03-04 LAB — ECHOCARDIOGRAM COMPLETE
Area-P 1/2: 3.23 cm2
S' Lateral: 2.6 cm

## 2023-03-04 NOTE — Progress Notes (Signed)
Echocardiogram 03/04/2023:   1. Left ventricular ejection fraction, by estimation, is 60 to 65%. The left ventricle has normal function. The left ventricle has no regional wall motion abnormalities. There is mild concentric left ventricular hypertrophy. Left ventricular diastolic  parameters are consistent with Grade I diastolic dysfunction (impaired relaxation). The average left ventricular global longitudinal strain is -18.6 %. The global longitudinal strain is normal.  2. Right ventricular systolic function is normal. The right ventricular size is normal. Tricuspid regurgitation signal is inadequate for assessing PA pressure.  3. The mitral valve is normal in structure. No evidence of mitral valve regurgitation. No evidence of mitral stenosis.  4. The aortic valve is tricuspid. Aortic valve regurgitation is not visualized. No aortic stenosis is present.  5. The inferior vena cava is normal in size with greater than 50% respiratory variability, suggesting right atrial pressure of 3 mmHg.

## 2023-03-15 ENCOUNTER — Encounter: Payer: Self-pay | Admitting: Adult Health

## 2023-03-15 ENCOUNTER — Ambulatory Visit (INDEPENDENT_AMBULATORY_CARE_PROVIDER_SITE_OTHER): Payer: Medicaid Other | Admitting: Adult Health

## 2023-03-15 VITALS — BP 130/92 | HR 81 | Temp 99.3°F | Ht 66.0 in | Wt 241.0 lb

## 2023-03-15 DIAGNOSIS — J020 Streptococcal pharyngitis: Secondary | ICD-10-CM | POA: Diagnosis not present

## 2023-03-15 LAB — POCT RAPID STREP A (OFFICE): Rapid Strep A Screen: POSITIVE — AB

## 2023-03-15 MED ORDER — FLUCONAZOLE 150 MG PO TABS
150.0000 mg | ORAL_TABLET | Freq: Every day | ORAL | 1 refills | Status: DC
Start: 2023-03-15 — End: 2023-09-12

## 2023-03-15 MED ORDER — METHYLPREDNISOLONE 4 MG PO TBPK
ORAL_TABLET | ORAL | 0 refills | Status: DC
Start: 2023-03-15 — End: 2023-09-12

## 2023-03-15 MED ORDER — AMOXICILLIN-POT CLAVULANATE 875-125 MG PO TABS
1.0000 | ORAL_TABLET | Freq: Two times a day (BID) | ORAL | 0 refills | Status: DC
Start: 2023-03-15 — End: 2023-09-12

## 2023-03-15 NOTE — Progress Notes (Signed)
Subjective:    Patient ID: Diana Roman, female    DOB: 11/05/1985, 37 y.o.   MRN: [id number]  HPI 37 year old female who  has a past medical history of Abnormal Pap smear (2011), Anxiety, Bipolar 1 disorder (HCC), Depression, H/O bacterial infection, H/O varicella, Headache(784.0), Hypertension, Infection, Migraine, No pertinent past medical history, Sickle cell trait (HCC), Vaginal Pap smear, abnormal, and Yeast infection.  She is a patient of Dr. Salomon Fick who I am seeing today for an acute issue. She reports that about a week ago she developed a fever up to 102, nausea and vomiting ( this has resolved), body aches,  productive cough, shortness of breath, chest congestions and sinus pressure.   She has not had ay diarrhea or wheezing.   At home covid test was negative.   At home she has been using Theraflu and dayquil/nyquil which helps to some degree.   Review of Systems See HPI   Past Medical History:  Diagnosis Date   Abnormal Pap smear 2011   Anxiety    Bipolar 1 disorder (HCC)    Depression    on meds, doing well   H/O bacterial infection    H/O varicella    Headache(784.0)    Frequent   Hypertension    Infection    UTI   Migraine    No pertinent past medical history    Sickle cell trait (HCC)    Vaginal Pap smear, abnormal    repeat was normal   Yeast infection     Social History   Socioeconomic History   Marital status: Divorced    Spouse name: Not on file   Number of children: Not on file   Years of education: Not on file   Highest education level: Not on file  Occupational History   Not on file  Tobacco Use   Smoking status: Never   Smokeless tobacco: Never  Vaping Use   Vaping status: Never Used  Substance and Sexual Activity   Alcohol use: Yes    Alcohol/week: 2.0 standard drinks of alcohol    Types: 2 Glasses of wine per week    Comment: 2/wk   Drug use: No   Sexual activity: Yes    Birth control/protection: None  Other Topics Concern    Not on file  Social History Narrative   Not on file   Social Determinants of Health   Financial Resource Strain: Not on file  Food Insecurity: Not on file  Transportation Needs: Not on file  Physical Activity: Not on file  Stress: Not on file  Social Connections: Not on file  Intimate Partner Violence: Not on file    Past Surgical History:  Procedure Laterality Date   DENTAL SURGERY      Family History  Problem Relation Age of Onset   Hypertension Mother    Hypertension Sister    Diabetes Maternal Grandmother    Glaucoma Maternal Grandmother    Sleep apnea Neg Hx     Allergies  Allergen Reactions   Lisinopril Anaphylaxis    Facial swelling    Current Outpatient Medications on File Prior to Visit  Medication Sig Dispense Refill   acetaminophen (TYLENOL) 325 MG tablet Take 2 tablets (650 mg total) by mouth every 6 (six) hours as needed. 120 tablet 0   ALPRAZolam (XANAX) 1 MG tablet 1/2 TO 1 PO QD PRN 30 tablet 2   ALPRAZolam (XANAX) 1 MG tablet Take 0.5-1 tablets (0.5-1 mg total) by  mouth daily as needed. 30 tablet 0   amLODipine (NORVASC) 5 MG tablet Take 1 tablet (5 mg total) by mouth daily. 90 tablet 1   ibuprofen (ADVIL) 800 MG tablet Take 1 tablet (800 mg total) by mouth every 8 (eight) hours as needed (pain). 21 tablet 0   labetalol (NORMODYNE) 200 MG tablet TAKE 1 TABLET BY MOUTH TWICE A DAY 180 tablet 1   losartan (COZAAR) 50 MG tablet Take 1 tablet (50 mg total) by mouth every evening. 30 tablet 2   lurasidone (LATUDA) 80 MG TABS tablet Take 1 tablet (80 mg total) by mouth every evening with a full meal 30 tablet 12   Multiple Vitamins-Minerals (ONE-A-DAY WOMENS PO) Take 1 tablet by mouth daily.     ondansetron (ZOFRAN) 4 MG tablet Take 1 tablet (4 mg total) by mouth every 8 (eight) hours as needed for nausea or vomiting. 20 tablet 0   senna-docusate (SENOKOT-S) 8.6-50 MG tablet Take 1 tablet by mouth daily. 30 tablet 1   No current facility-administered  medications on file prior to visit.    BP (!) 130/92   Pulse 81   Temp 99.3 F (37.4 C) (Oral)   Ht 5\' 6"  (1.676 m)   Wt 241 lb (109.3 kg)   SpO2 98%   BMI 38.90 kg/m       Objective:   Physical Exam Vitals and nursing note reviewed.  Constitutional:      Appearance: She is well-developed. She is ill-appearing and diaphoretic.  HENT:     Right Ear: Hearing, tympanic membrane, ear canal and external ear normal.     Left Ear: Hearing, tympanic membrane, ear canal and external ear normal.     Nose: Congestion present. No rhinorrhea.     Right Turbinates: Not enlarged or swollen.     Left Turbinates: Not enlarged or swollen.     Right Sinus: Maxillary sinus tenderness present. No frontal sinus tenderness.     Left Sinus: Maxillary sinus tenderness present. No frontal sinus tenderness.     Mouth/Throat:     Mouth: Mucous membranes are moist.     Pharynx: Oropharynx is clear. Uvula midline.     Tonsils: Tonsillar exudate present. 2+ on the right. 2+ on the left.  Cardiovascular:     Rate and Rhythm: Normal rate and regular rhythm.     Pulses: Normal pulses.     Heart sounds: Normal heart sounds.  Pulmonary:     Effort: Pulmonary effort is normal.     Breath sounds: Normal breath sounds.  Musculoskeletal:        General: Normal range of motion.  Lymphadenopathy:     Head:     Right side of head: Submandibular and tonsillar adenopathy present.     Left side of head: Submandibular and tonsillar adenopathy present.  Skin:    General: Skin is warm.  Neurological:     General: No focal deficit present.     Mental Status: She is alert and oriented to person, place, and time.  Psychiatric:        Mood and Affect: Mood normal.        Behavior: Behavior normal.        Thought Content: Thought content normal.        Judgment: Judgment normal.       Assessment & Plan:  1. Strep throat - Will teat with Augmentin and send in Prednisone for sore throat. She is prone to yeast  infections with abx  use so will send in Diflucan  - amoxicillin-clavulanate (AUGMENTIN) 875-125 MG tablet; Take 1 tablet by mouth 2 (two) times daily.  Dispense: 20 tablet; Refill: 0 - fluconazole (DIFLUCAN) 150 MG tablet; Take 1 tablet (150 mg total) by mouth daily.  Dispense: 1 tablet; Refill: 1 - methylPREDNISolone (MEDROL DOSEPAK) 4 MG TBPK tablet; Take as directed  Dispense: 21 tablet; Refill: 0 - POC Rapid Strep A- Positive

## 2023-03-29 NOTE — Progress Notes (Signed)
Zio Patch Extended out patient EKG monitoring 6 days starting 02/22/2023:  Minimum heart rate 48 bpm, maximal heart rate 160 bpm.  Predominant rhythm is normal sinus rhythm. No atrial fibrillation. There were rare isolated PACs and PVCs.  Symptomatic transmission correlated with rare PVCs.

## 2023-04-25 NOTE — Progress Notes (Unsigned)
Cardiology Office Note    Patient Name: Diana Roman Date of Encounter: 04/25/2023  Primary Care Provider:  Deeann Saint, MD Primary Cardiologist:  Yates Decamp, MD Primary Electrophysiologist: None   Past Medical History    Past Medical History:  Diagnosis Date   Abnormal Pap smear 2011   Anxiety    Bipolar 1 disorder (HCC)    Depression    on meds, doing well   H/O bacterial infection    H/O varicella    Headache(784.0)    Frequent   Hypertension    Infection    UTI   Migraine    No pertinent past medical history    Sickle cell trait (HCC)    Vaginal Pap smear, abnormal    repeat was normal   Yeast infection     History of Present Illness  CURRAN POTENZA is a 37 y.o. female with a PMH of essential hypertension, palpitations, intermittent chest pain, bipolar 1 disorder, anxiety who presents today for 82-month follow-up.  Ms. Fairfax was seen in the ED on 02/09/2023 with complaint of chest pain which was described as radiating from the center of the chest to the left shoulder.  Troponins were negative x 2 and EKG showed TWI in inferior and lateral leads.  She underwent a CTA which was negative for acute abnormalities.  She was discharged home and referred to cardiology.  She was seen by Dr. Jacinto Halim on 02/15/2023 for follow-up visit.  During patient's visit she reported intermittent chest pain and palpitations that she described as knot-like sensation that comes and goes approximately 5 minutes at a time.  She reported a history of acid reflux and consumes lots of caffeine due to working nights.  She was encouraged to reduce caffeine intake with plan to order cardiac stress testing.  2D echo was ordered as well as 2-week event monitor.  2D echo showed normal pump function of 60 to 65% with mild diastolic dysfunction and advisement to work on BP control and weight loss.  During follow-up patient had losartan added to current antihypertensive regimen.  Zio patch results showed  predominantly sinus rhythm with no episodes of AF and isolated PACs/PVCs.  Patient had referral placed to pulmonary for sleep study.   During today's visit the patient reports*** .  Patient denies chest pain, palpitations, dyspnea, PND, orthopnea, nausea, vomiting, dizziness, syncope, edema, weight gain, or early satiety.  ***Notes: -Last ischemic evaluation: -Last echo: -Interim ED visits: Review of Systems  Please see the history of present illness.    All other systems reviewed and are otherwise negative except as noted above.  Physical Exam    Wt Readings from Last 3 Encounters:  03/15/23 241 lb (109.3 kg)  02/15/23 241 lb 9.6 oz (109.6 kg)  02/09/23 242 lb 8.1 oz (110 kg)   UU:VOZDG were no vitals filed for this visit.,There is no height or weight on file to calculate BMI. GEN: Well nourished, well developed in no acute distress Neck: No JVD; No carotid bruits Pulmonary: Clear to auscultation without rales, wheezing or rhonchi  Cardiovascular: Normal rate. Regular rhythm. Normal S1. Normal S2.   Murmurs: There is no murmur.  ABDOMEN: Soft, non-tender, non-distended EXTREMITIES:  No edema; No deformity   EKG/LABS/ Recent Cardiac Studies   ECG personally reviewed by me today - ***  Risk Assessment/Calculations:   {Does this patient have ATRIAL FIBRILLATION?:[phone]}      Lab Results  Component Value Date   WBC 5.0 02/09/2023  HGB 11.0 (L) 02/09/2023   HCT 34.9 (L) 02/09/2023   MCV 88.4 02/09/2023   PLT 260 02/09/2023   Lab Results  Component Value Date   CREATININE 0.84 02/09/2023   BUN 20 02/09/2023   NA 136 02/09/2023   K 3.5 02/09/2023   CL 108 02/09/2023   CO2 23 02/09/2023   Lab Results  Component Value Date   CHOL 163 07/10/2021   HDL 42.30 07/10/2021   LDLCALC 92 07/10/2021   TRIG 141.0 07/10/2021   CHOLHDL 4 07/10/2021    Lab Results  Component Value Date   HGBA1C 5.8 07/10/2021   Assessment & Plan    1.  Precordial chest pain:  2.   Palpitations: -1 week ZIO monitor completed showing predominantly sinus rhythm with no episodes of AF or sustained arrhythmia  3.  Essential hypertension -Patient's blood pressure today was***  4.  Sleep disturbance: -Patient has referral placed to pulmonary for sleep study.      Disposition: Follow-up with Yates Decamp, MD or APP in *** months {Are you ordering a CV Procedure (e.g. stress test, cath, DCCV, TEE, etc)?   Press F2        :[id number]}   Signed, Napoleon Form, Leodis Rains, NP 04/25/2023, 11:37 AM [facility]

## 2023-04-26 ENCOUNTER — Ambulatory Visit: Payer: Medicaid Other | Attending: Nurse Practitioner | Admitting: Nurse Practitioner

## 2023-04-26 ENCOUNTER — Encounter: Payer: Self-pay | Admitting: Internal Medicine

## 2023-04-26 ENCOUNTER — Ambulatory Visit (INDEPENDENT_AMBULATORY_CARE_PROVIDER_SITE_OTHER): Payer: Medicaid Other | Admitting: Internal Medicine

## 2023-04-26 VITALS — BP 130/90 | HR 85 | Temp 98.3°F | Wt 240.5 lb

## 2023-04-26 DIAGNOSIS — J069 Acute upper respiratory infection, unspecified: Secondary | ICD-10-CM

## 2023-04-26 DIAGNOSIS — R002 Palpitations: Secondary | ICD-10-CM

## 2023-04-26 DIAGNOSIS — G479 Sleep disorder, unspecified: Secondary | ICD-10-CM

## 2023-04-26 DIAGNOSIS — R072 Precordial pain: Secondary | ICD-10-CM

## 2023-04-26 DIAGNOSIS — I10 Essential (primary) hypertension: Secondary | ICD-10-CM

## 2023-04-26 DIAGNOSIS — R059 Cough, unspecified: Secondary | ICD-10-CM

## 2023-04-26 LAB — POCT INFLUENZA A/B
Influenza A, POC: NEGATIVE
Influenza B, POC: NEGATIVE

## 2023-04-26 LAB — POC COVID19 BINAXNOW: SARS Coronavirus 2 Ag: NEGATIVE

## 2023-04-26 MED ORDER — BENZONATATE 100 MG PO CAPS
100.0000 mg | ORAL_CAPSULE | Freq: Two times a day (BID) | ORAL | 0 refills | Status: DC | PRN
Start: 2023-04-26 — End: 2023-09-12

## 2023-04-26 NOTE — Progress Notes (Signed)
Established Patient Office Visit     CC/Reason for Visit: URI symptoms, cough  HPI: Diana Roman is a 37 y.o. female who is coming in today for the above mentioned reasons. For 4 days has been experiencing severe coughing with post-tussive emesis, sinus pressure and congestion. Had strep pharyngitis in October.   Past Medical/Surgical History: Past Medical History:  Diagnosis Date   Abnormal Pap smear 2011   Anxiety    Bipolar 1 disorder (HCC)    Depression    on meds, doing well   H/O bacterial infection    H/O varicella    Headache(784.0)    Frequent   Hypertension    Infection    UTI   Migraine    No pertinent past medical history    Sickle cell trait (HCC)    Vaginal Pap smear, abnormal    repeat was normal   Yeast infection     Past Surgical History:  Procedure Laterality Date   DENTAL SURGERY      Social History:  reports that she has never smoked. She has never used smokeless tobacco. She reports current alcohol use of about 2.0 standard drinks of alcohol per week. She reports that she does not use drugs.  Allergies: Allergies  Allergen Reactions   Lisinopril Anaphylaxis    Facial swelling    Family History:  Family History  Problem Relation Age of Onset   Hypertension Mother    Hypertension Sister    Diabetes Maternal Grandmother    Glaucoma Maternal Grandmother    Sleep apnea Neg Hx      Current Outpatient Medications:    acetaminophen (TYLENOL) 325 MG tablet, Take 2 tablets (650 mg total) by mouth every 6 (six) hours as needed., Disp: 120 tablet, Rfl: 0   ALPRAZolam (XANAX) 1 MG tablet, 1/2 TO 1 PO QD PRN, Disp: 30 tablet, Rfl: 2   ALPRAZolam (XANAX) 1 MG tablet, Take 0.5-1 tablets (0.5-1 mg total) by mouth daily as needed., Disp: 30 tablet, Rfl: 0   amLODipine (NORVASC) 5 MG tablet, Take 1 tablet (5 mg total) by mouth daily., Disp: 90 tablet, Rfl: 1   amoxicillin-clavulanate (AUGMENTIN) 875-125 MG tablet, Take 1 tablet by mouth 2  (two) times daily., Disp: 20 tablet, Rfl: 0   benzonatate (TESSALON) 100 MG capsule, Take 1 capsule (100 mg total) by mouth 2 (two) times daily as needed for cough., Disp: 20 capsule, Rfl: 0   fluconazole (DIFLUCAN) 150 MG tablet, Take 1 tablet (150 mg total) by mouth daily., Disp: 1 tablet, Rfl: 1   ibuprofen (ADVIL) 800 MG tablet, Take 1 tablet (800 mg total) by mouth every 8 (eight) hours as needed (pain)., Disp: 21 tablet, Rfl: 0   labetalol (NORMODYNE) 200 MG tablet, TAKE 1 TABLET BY MOUTH TWICE A DAY, Disp: 180 tablet, Rfl: 1   losartan (COZAAR) 50 MG tablet, Take 1 tablet (50 mg total) by mouth every evening., Disp: 30 tablet, Rfl: 2   lurasidone (LATUDA) 80 MG TABS tablet, Take 1 tablet (80 mg total) by mouth every evening with a full meal, Disp: 30 tablet, Rfl: 12   methylPREDNISolone (MEDROL DOSEPAK) 4 MG TBPK tablet, Take as directed, Disp: 21 tablet, Rfl: 0   Multiple Vitamins-Minerals (ONE-A-DAY WOMENS PO), Take 1 tablet by mouth daily., Disp: , Rfl:    ondansetron (ZOFRAN) 4 MG tablet, Take 1 tablet (4 mg total) by mouth every 8 (eight) hours as needed for nausea or vomiting., Disp: 20 tablet, Rfl:  0   senna-docusate (SENOKOT-S) 8.6-50 MG tablet, Take 1 tablet by mouth daily., Disp: 30 tablet, Rfl: 1  Review of Systems:  Negative unless indicated in HPI.   Physical Exam: Vitals:   04/26/23 1554  BP: (!) 130/90  Pulse: 85  Temp: 98.3 F (36.8 C)  TempSrc: Oral  SpO2: 97%  Weight: 240 lb 8 oz (109.1 kg)    Body mass index is 38.82 kg/m.   Physical Exam Vitals reviewed.  Constitutional:      Appearance: Normal appearance.  HENT:     Right Ear: Tympanic membrane, ear canal and external ear normal.     Left Ear: Tympanic membrane, ear canal and external ear normal.     Mouth/Throat:     Mouth: Mucous membranes are moist.     Pharynx: Oropharynx is clear.  Eyes:     Conjunctiva/sclera: Conjunctivae normal.     Pupils: Pupils are equal, round, and reactive to light.   Cardiovascular:     Rate and Rhythm: Normal rate and regular rhythm.  Pulmonary:     Effort: Pulmonary effort is normal.     Breath sounds: Normal breath sounds.  Neurological:     Mental Status: She is alert.      Impression and Plan:  URI with cough and congestion -     Benzonatate; Take 1 capsule (100 mg total) by mouth 2 (two) times daily as needed for cough.  Dispense: 20 capsule; Refill: 0  Cough, unspecified type -     POC COVID-19 BinaxNow -     POCT Influenza A/B   -In office flu and COVID tests are negative. -Given exam findings, PNA, pharyngitis, ear infection are not likely, hence abx have not been prescribed. -Have advised rest, fluids, OTC antihistamines, cough suppressants and mucinex. -RTC if no improvement in 10-14 days. -Tessalon perles as needed for cough.   Time spent:23 minutes reviewing chart, interviewing and examining patient and formulating plan of care.     Chaya Jan, MD  Primary Care at [facility]

## 2023-06-03 ENCOUNTER — Encounter: Payer: Self-pay | Admitting: Cardiology

## 2023-06-14 DIAGNOSIS — F3174 Bipolar disorder, in full remission, most recent episode manic: Secondary | ICD-10-CM | POA: Diagnosis not present

## 2023-06-14 DIAGNOSIS — G2401 Drug induced subacute dyskinesia: Secondary | ICD-10-CM | POA: Diagnosis not present

## 2023-06-14 DIAGNOSIS — F3176 Bipolar disorder, in full remission, most recent episode depressed: Secondary | ICD-10-CM | POA: Diagnosis not present

## 2023-06-16 ENCOUNTER — Other Ambulatory Visit (HOSPITAL_BASED_OUTPATIENT_CLINIC_OR_DEPARTMENT_OTHER): Payer: Self-pay

## 2023-06-16 MED ORDER — INGREZZA 40 MG PO CAPS
40.0000 mg | ORAL_CAPSULE | Freq: Every day | ORAL | 0 refills | Status: DC
Start: 2023-06-16 — End: 2024-04-06
  Filled 2023-06-16 – 2023-07-12 (×2): qty 30, 30d supply, fill #0

## 2023-06-17 ENCOUNTER — Other Ambulatory Visit (HOSPITAL_BASED_OUTPATIENT_CLINIC_OR_DEPARTMENT_OTHER): Payer: Self-pay

## 2023-06-17 MED ORDER — LURASIDONE HCL 20 MG PO TABS
20.0000 mg | ORAL_TABLET | Freq: Every day | ORAL | 3 refills | Status: AC
  Filled 2023-06-17 – 2023-07-12 (×2): qty 90, 90d supply, fill #0
  Filled 2024-02-28: qty 90, 90d supply, fill #1

## 2023-06-22 ENCOUNTER — Other Ambulatory Visit (HOSPITAL_BASED_OUTPATIENT_CLINIC_OR_DEPARTMENT_OTHER): Payer: Self-pay

## 2023-06-25 ENCOUNTER — Other Ambulatory Visit (HOSPITAL_BASED_OUTPATIENT_CLINIC_OR_DEPARTMENT_OTHER): Payer: Self-pay

## 2023-06-25 ENCOUNTER — Other Ambulatory Visit: Payer: Self-pay

## 2023-06-27 ENCOUNTER — Other Ambulatory Visit (HOSPITAL_BASED_OUTPATIENT_CLINIC_OR_DEPARTMENT_OTHER): Payer: Self-pay

## 2023-07-07 ENCOUNTER — Ambulatory Visit: Payer: 59 | Admitting: Cardiology

## 2023-07-08 ENCOUNTER — Other Ambulatory Visit (HOSPITAL_BASED_OUTPATIENT_CLINIC_OR_DEPARTMENT_OTHER): Payer: Self-pay

## 2023-07-11 ENCOUNTER — Other Ambulatory Visit: Payer: Self-pay | Admitting: Family Medicine

## 2023-07-11 DIAGNOSIS — I10 Essential (primary) hypertension: Secondary | ICD-10-CM

## 2023-07-11 NOTE — Telephone Encounter (Signed)
 Copied from CRM [id number]. Topic: Clinical - Medication Refill >> Jul 11, 2023  1:41 PM Sonny Dandy B wrote: Most Recent Primary Care Visit:  Provider: Henderson Cloud  Department: [facility]  Visit Type: OFFICE VISIT  Date: 04/26/2023  Medication: amLODipine (NORVASC) 5 MG tablet, labetalol (NORMODYNE) 200 MG tablet [  Has the patient contacted their pharmacy? Yes (Agent: If no, request that the patient contact the pharmacy for the refill. If patient does not wish to contact the pharmacy document the reason why and proceed with request.) (Agent: If yes, when and what did the pharmacy advise?)  Is this the correct pharmacy for this prescription? Yes If no, delete pharmacy and type the correct one.  This is the patient's preferred pharmacy: [facility]  zip [phone]  Has the prescription been filled recently? Yes  Is the patient out of the medication? Yes  Has the patient been seen for an appointment in the last year OR does the patient have an upcoming appointment? Yes  Can we respond through MyChart? Yes  Agent: Please be advised that Rx refills may take up to 3 business days. We ask that you follow-up with your pharmacy.

## 2023-07-11 NOTE — Telephone Encounter (Signed)
 Last Fill: Amlodipine: 10/25/22     Labetalol: 03/12/22  Last OV: 04/26/23 Next OV: None Scheduled  Routing to provider for review/authorization.

## 2023-07-12 ENCOUNTER — Other Ambulatory Visit: Payer: Self-pay | Admitting: Family Medicine

## 2023-07-12 ENCOUNTER — Other Ambulatory Visit (HOSPITAL_COMMUNITY): Payer: Self-pay

## 2023-07-12 ENCOUNTER — Other Ambulatory Visit (HOSPITAL_BASED_OUTPATIENT_CLINIC_OR_DEPARTMENT_OTHER): Payer: Self-pay

## 2023-07-12 DIAGNOSIS — I10 Essential (primary) hypertension: Secondary | ICD-10-CM

## 2023-07-13 ENCOUNTER — Other Ambulatory Visit (HOSPITAL_BASED_OUTPATIENT_CLINIC_OR_DEPARTMENT_OTHER): Payer: Self-pay

## 2023-07-14 ENCOUNTER — Other Ambulatory Visit (HOSPITAL_BASED_OUTPATIENT_CLINIC_OR_DEPARTMENT_OTHER): Payer: Self-pay

## 2023-07-14 ENCOUNTER — Other Ambulatory Visit: Payer: Self-pay | Admitting: Family Medicine

## 2023-07-14 DIAGNOSIS — I10 Essential (primary) hypertension: Secondary | ICD-10-CM

## 2023-07-15 MED ORDER — AMLODIPINE BESYLATE 5 MG PO TABS: 5.0000 mg | ORAL_TABLET | Freq: Every day | ORAL | 1 refills | Status: DC

## 2023-07-15 MED ORDER — LABETALOL HCL 200 MG PO TABS: 200.0000 mg | ORAL_TABLET | Freq: Two times a day (BID) | ORAL | 1 refills | Status: DC

## 2023-07-15 NOTE — Telephone Encounter (Signed)
 Patient is calling back in she needs   a call back

## 2023-07-18 ENCOUNTER — Encounter: Payer: Self-pay | Admitting: Family Medicine

## 2023-07-18 ENCOUNTER — Other Ambulatory Visit (HOSPITAL_BASED_OUTPATIENT_CLINIC_OR_DEPARTMENT_OTHER): Payer: Self-pay

## 2023-07-18 ENCOUNTER — Ambulatory Visit: Payer: 59 | Admitting: Family Medicine

## 2023-07-18 VITALS — BP 150/100 | HR 90 | Temp 99.0°F | Ht 66.0 in | Wt 239.2 lb

## 2023-07-18 DIAGNOSIS — F419 Anxiety disorder, unspecified: Secondary | ICD-10-CM

## 2023-07-18 DIAGNOSIS — R079 Chest pain, unspecified: Secondary | ICD-10-CM

## 2023-07-18 DIAGNOSIS — I10 Essential (primary) hypertension: Secondary | ICD-10-CM

## 2023-07-18 MED ORDER — LOSARTAN POTASSIUM 25 MG PO TABS
25.0000 mg | ORAL_TABLET | Freq: Every day | ORAL | 3 refills | Status: DC
Start: 2023-07-18 — End: 2024-03-09
  Filled 2023-07-18: qty 90, 90d supply, fill #0

## 2023-07-18 NOTE — Progress Notes (Signed)
 Established Patient Office Visit   Subjective  Patient ID: Diana Roman, female    DOB: 22-Jun-1985  Age: 38 y.o. MRN: [id number]  Chief Complaint  Patient presents with   Medication Refill   Chest Pain    Started 4 days ago, patient is also having high BP readings, possible GERD     Patient is a 38 year old female seen for acute concern.  Patient endorses CP with LUE pain and head x 5 days.  Patient states she woke up Wednesday night with chest pain.  Thought it was due to indigestion so tried Pepcid and Mylanta without improvement.  Pain noted as sharp, constant.  More of a tightness/pulling with laying down.  Patient notes BP has been elevated 140s/90s and 150/100 last week as she was out of her blood pressure medications Norvasc and labetalol.  Patient states she has never started losartan which was prescribed by cardiology in late 2024 per chart review.  Patient denies changes in foods, nausea, vomiting, dizziness, changes in vision.  Started Ingrezza 40 mg last wk.  Chest Pain     Patient Active Problem List   Diagnosis Date Noted   Hypertension 10/25/2018   Bipolar 1 disorder, depressed, severe (HCC) 03/16/2018   Suicide attempt [facility])    Past Medical History:  Diagnosis Date   Abnormal Pap smear 2011   Anxiety    Bipolar 1 disorder (HCC)    Depression    on meds, doing well   H/O bacterial infection    H/O varicella    Headache(784.0)    Frequent   Hypertension    Infection    UTI   Migraine    No pertinent past medical history    Sickle cell trait (HCC)    Vaginal Pap smear, abnormal    repeat was normal   Yeast infection    Past Surgical History:  Procedure Laterality Date   DENTAL SURGERY     Social History   Tobacco Use   Smoking status: Never   Smokeless tobacco: Never  Vaping Use   Vaping status: Never Used  Substance Use Topics   Alcohol use: Yes    Alcohol/week: 2.0 standard drinks of alcohol    Types: 2 Glasses of wine per week    Comment:  2/wk   Drug use: No   Family History  Problem Relation Age of Onset   Hypertension Mother    Hypertension Sister    Diabetes Maternal Grandmother    Glaucoma Maternal Grandmother    Sleep apnea Neg Hx    Allergies  Allergen Reactions   Lisinopril Anaphylaxis    Facial swelling      Review of Systems  Cardiovascular:  Positive for chest pain.   Negative unless stated above    Objective:     BP (!) 150/100 (BP Location: Left Arm, Patient Position: Sitting, Cuff Size: Large)   Pulse 90   Temp 99 F (37.2 C) (Oral)   Ht 5\' 6"  (1.676 m)   Wt 239 lb 3.2 oz (108.5 kg)   LMP 07/11/2023 (Exact Date)   SpO2 97%   BMI 38.61 kg/m  BP Readings from Last 3 Encounters:  07/18/23 (!) 150/100  04/26/23 (!) 130/90  03/15/23 (!) 130/92   Wt Readings from Last 3 Encounters:  07/18/23 239 lb 3.2 oz (108.5 kg)  04/26/23 240 lb 8 oz (109.1 kg)  03/15/23 241 lb (109.3 kg)      Physical Exam Vitals reviewed.  Constitutional:  Appearance: She is obese.  HENT:     Head: Normocephalic and atraumatic.  Eyes:     Extraocular Movements: Extraocular movements intact.     Pupils: Pupils are equal, round, and reactive to light.  Cardiovascular:     Rate and Rhythm: Normal rate and regular rhythm.     Heart sounds: Normal heart sounds.  Pulmonary:     Effort: Pulmonary effort is normal.     Breath sounds: Normal breath sounds.  Chest:     Chest wall: Tenderness present.  Skin:    General: Skin is warm and dry.  Neurological:     Mental Status: She is alert and oriented to person, place, and time.       07/18/2023    2:58 PM 04/26/2023    4:14 PM 10/25/2022    4:34 PM  Depression screen PHQ 2/9  Decreased Interest 2 3 1   Down, Depressed, Hopeless 2 3 1   PHQ - 2 Score 4 6 2   Altered sleeping 2 3 2   Tired, decreased energy 2 3 2   Change in appetite 2 3 1   Feeling bad or failure about yourself  1 3 1   Trouble concentrating 2 3 0  Moving slowly or fidgety/restless 1 3 0   Suicidal thoughts 0 0 0  PHQ-9 Score 14 24 8   Difficult doing work/chores Extremely dIfficult Extremely dIfficult Somewhat difficult      07/18/2023    2:59 PM 04/26/2023    4:14 PM 10/25/2022    4:35 PM 03/08/2022    2:16 PM  GAD 7 : Generalized Anxiety Score  Nervous, Anxious, on Edge 2 3 1 3   Control/stop worrying 2 3 1 3   Worry too much - different things 2 3 1 3   Trouble relaxing 2 3 1 3   Restless 0 3 1 3   Easily annoyed or irritable 3 3 1 3   Afraid - awful might happen 3 3 0 3  Total GAD 7 Score 14 21 6 21   Anxiety Difficulty Extremely difficult Extremely difficult Somewhat difficult Extremely difficult      No results found for any visits on 07/18/23.    Assessment & Plan:  Chest pain, unspecified type -     EKG 12-Lead  Uncontrolled hypertension -     EKG 12-Lead -     Losartan Potassium; Take 1 tablet (25 mg total) by mouth daily.  Dispense: 90 tablet; Refill: 3  Anxiety   Patient with new onset CP x 5 days.  Discussed possible causes of symptoms including GERD, anxiety, musculoskeletal pain, HTN.  Symptoms were not relieved with Mylanta or H2 blocker.  Some improvement in anxiety noted since last OFV is GAD-7 score 14 this visit, previously 21 on 04/26/2023.  Symptoms most likely 2/2 elevated BP as patient ran out of Norvasc and labetalol.  Med since restarted.  Given continued elevation start losartan 25 mg daily.  EKG in clinic NSR, nonspecific flattening of T waves in leads I, 2, 3, aVL, aVR.  Similar to prior study on 02/15/2023.  Patient to monitor BP at home, notify clinic for elevations consistently greater than 140/90.  For continued or worsening symptoms proceed to ED.  Schedule follow-up with cards.  Patient also encouraged to set up appointment for in person sleep study.    On day of service, 54 minutes spent caring for this patient face-to-face, reviewing the chart, counseling and/or coordinating care for plan and treatment of diagnosis below.    Return  in about 6  weeks (around 08/29/2023), or if symptoms worsen or fail to improve.   Deeann Saint, MD

## 2023-07-19 ENCOUNTER — Other Ambulatory Visit (HOSPITAL_BASED_OUTPATIENT_CLINIC_OR_DEPARTMENT_OTHER): Payer: Self-pay

## 2023-07-22 ENCOUNTER — Other Ambulatory Visit: Payer: Self-pay

## 2023-07-28 ENCOUNTER — Other Ambulatory Visit (HOSPITAL_BASED_OUTPATIENT_CLINIC_OR_DEPARTMENT_OTHER): Payer: Self-pay

## 2023-09-12 ENCOUNTER — Encounter: Payer: Self-pay | Admitting: Cardiology

## 2023-09-12 ENCOUNTER — Ambulatory Visit: Payer: 59 | Attending: Cardiology | Admitting: Cardiology

## 2023-09-12 ENCOUNTER — Other Ambulatory Visit (HOSPITAL_BASED_OUTPATIENT_CLINIC_OR_DEPARTMENT_OTHER): Payer: Self-pay

## 2023-09-12 VITALS — BP 135/83 | HR 75 | Resp 16 | Ht 66.0 in | Wt 241.0 lb

## 2023-09-12 DIAGNOSIS — R002 Palpitations: Secondary | ICD-10-CM

## 2023-09-12 DIAGNOSIS — I10 Essential (primary) hypertension: Secondary | ICD-10-CM | POA: Diagnosis not present

## 2023-09-12 DIAGNOSIS — M940 Chondrocostal junction syndrome [Tietze]: Secondary | ICD-10-CM

## 2023-09-12 DIAGNOSIS — K219 Gastro-esophageal reflux disease without esophagitis: Secondary | ICD-10-CM

## 2023-09-12 MED ORDER — OMEPRAZOLE 20 MG PO CPDR
20.0000 mg | DELAYED_RELEASE_CAPSULE | Freq: Every day | ORAL | 0 refills | Status: DC
Start: 2023-09-12 — End: 2024-02-09
  Filled 2023-09-12: qty 90, 90d supply, fill #0

## 2023-09-12 NOTE — Patient Instructions (Addendum)
 Reasons for reproducible pain explained to the patient. Heat to tender area. Cold ice compressions to reduce inflammation. Recommended Aleve  OTC 1-2 tablets BID for 2-3 days and PRN. VOLTAREN GEL PRN.   Medication Instructions:  Your physician recommends that you continue on your current medications as directed. Please refer to the Current Medication list given to you today.  *If you need a refill on your cardiac medications before your next appointment, please call your pharmacy*  Lab Work: none If you have labs (blood work) drawn today and your tests are completely normal, you will receive your results only by: MyChart Message (if you have MyChart) OR A paper copy in the mail If you have any lab test that is abnormal or we need to change your treatment, we will call you to review the results.  Testing/Procedures: none  Follow-Up: At [facility], you and your health needs are our priority.  As part of our continuing mission to provide you with exceptional heart care, our providers are all part of one team.  This team includes your primary Cardiologist (physician) and Advanced Practice Providers or APPs (Physician Assistants and Nurse Practitioners) who all work together to provide you with the care you need, when you need it.  Your next appointment:   As needed  Provider:   Knox Perl, MD    We recommend signing up for the patient portal called "MyChart".  Sign up information is provided on this After Visit Summary.  MyChart is used to connect with patients for Virtual Visits (Telemedicine).  Patients are able to view lab/test results, encounter notes, upcoming appointments, etc.  Non-urgent messages can be sent to your provider as well.   To learn more about what you can do with MyChart, go to [email or web address].   Other Instructions

## 2023-09-12 NOTE — Progress Notes (Signed)
 Cardiology Office Note:  .   Date:  09/12/2023  ID:  Riyan, Fadness 1985-09-06, MRN [id number] PCP: Viola Greulich, MD  [facility] Providers Cardiologist:  Knox Perl, MD   History of Present Illness: Diana Roman is a 38 y.o. with longstanding hypertension, morbid obesity, suspicion for OSA, has not had a sleep study, seen by me on 02/15/2023 for evaluation of palpitations, chest pain felt to be related to GERD and uncontrolled hypertension and also abnormal EKG suggestive of hypertensive heart disease.  She was prescribed losartan  which she never started however was seen by her PCP and was started back on this.  She now presents for follow-up. She has had a negative CT angiogram of the chest 02/09/2023 for pulmonary embolism, echocardiogram on 03/04/2023 revealing normal LV systolic function without significant valvular abnormality.  Discussed the use of AI scribe software for clinical note transcription with the patient, who gave verbal consent to proceed.  History of Present Illness The patient, with a history of hypertension, presents with chest pain, palpitations, hypertension and acid reflux. She reports that her chest pain has improved, but she now experiences severe acid reflux with every meal. She has been self-medicating with Mylanta, which provides relief. She also tried Pepcid , which was effective. She has not tried omeprazole.  The patient also reports palpitations, particularly when standing up quickly. This symptom has not changed significantly. She has been experiencing tenderness in her chest, which she attributes to costochondritis. She has been managing this with Aleve  and cold compressions.  The patient has a history of high blood pressure, which was reportedly very high recently. She has since restarted her losartan  medication and reports that her blood pressure is now better. She also reports that she has stopped consuming Red Bull and has been making  changes to her diet.  Labs   Lab Results  Component Value Date   CHOL 163 07/10/2021   HDL 42.30 07/10/2021   LDLCALC 92 07/10/2021   TRIG 141.0 07/10/2021   CHOLHDL 4 07/10/2021   Lab Results  Component Value Date   NA 136 02/09/2023   K 3.5 02/09/2023   CO2 23 02/09/2023   GLUCOSE 112 (H) 02/09/2023   BUN 20 02/09/2023   CREATININE 0.84 02/09/2023   CALCIUM 8.9 02/09/2023   GFR 75.37 10/25/2022   GFRNONAA >60 02/09/2023      Latest Ref Rng & Units 02/09/2023    5:58 PM 10/25/2022    4:17 PM 07/10/2021   11:28 AM  BMP  Glucose 70 - 99 mg/dL 956  94  87   BUN 6 - 20 mg/dL 20  17  24    Creatinine 0.44 - 1.00 mg/dL 3.87  5.64  3.32   Sodium 135 - 145 mmol/L 136  137  137   Potassium 3.5 - 5.1 mmol/L 3.5  3.9  3.5   Chloride 98 - 111 mmol/L 108  106  104   CO2 22 - 32 mmol/L 23  24  30    Calcium 8.9 - 10.3 mg/dL 8.9  9.2  9.2       Latest Ref Rng & Units 02/09/2023    5:58 PM 10/25/2022    4:17 PM 02/10/2022    6:19 PM  CBC  WBC 4.0 - 10.5 K/uL 5.0  5.4  5.3   Hemoglobin 12.0 - 15.0 g/dL 95.1  88.4  16.6   Hematocrit 36.0 - 46.0 % 34.9  36.1  37.2  Platelets 150 - 400 K/uL 260  234.0  241    Lab Results  Component Value Date   HGBA1C 5.8 07/10/2021    Lab Results  Component Value Date   TSH 1.66 07/10/2021    Review of Systems  Cardiovascular:  Positive for chest pain and palpitations. Negative for dyspnea on exertion, leg swelling, near-syncope and orthopnea.  Gastrointestinal:  Positive for heartburn.   Physical Exam:   VS:  BP 135/83 (BP Location: Left Arm, Patient Position: Sitting, Cuff Size: Large)   Pulse 75   Resp 16   Ht 5\' 6"  (1.676 m)   Wt 241 lb (109.3 kg)   SpO2 96%   BMI 38.90 kg/m    Wt Readings from Last 3 Encounters:  09/12/23 241 lb (109.3 kg)  07/18/23 239 lb 3.2 oz (108.5 kg)  04/26/23 240 lb 8 oz (109.1 kg)    Physical Exam Constitutional:      Appearance: She is obese.  Neck:     Vascular: No carotid bruit or JVD.   Cardiovascular:     Rate and Rhythm: Normal rate and regular rhythm.     Pulses: Intact distal pulses.     Heart sounds: Normal heart sounds. No murmur heard.    No gallop.  Pulmonary:     Effort: Pulmonary effort is normal.     Breath sounds: Normal breath sounds.  Abdominal:     General: Bowel sounds are normal.     Palpations: Abdomen is soft.  Musculoskeletal:     Right lower leg: No edema.     Left lower leg: No edema.    Studies Reviewed: Aaron Aas    LONG TERM MONITOR (3-14 DAYS) 03/17/2023   Narrative Zio Patch Extended out patient EKG monitoring 6 days starting 02/22/2023:   Minimum heart rate 48 bpm, maximal heart rate 160 bpm.  Predominant rhythm is normal sinus rhythm. No atrial fibrillation. There were rare isolated PACs and PVCs.  Symptomatic transmission correlated with rare PVCs.  ECHOCARDIOGRAM COMPLETE 03/04/2023  1. Left ventricular ejection fraction, by estimation, is 60 to 65%. The left ventricle has normal function. The left ventricle has no regional wall motion abnormalities. There is mild concentric left ventricular hypertrophy. Left ventricular diastolic parameters are consistent with Grade I diastolic dysfunction (impaired relaxation). The average left ventricular global longitudinal strain is -18.6 %. The global longitudinal strain is normal. 2. Right ventricular systolic function is normal. The right ventricular size is normal. Tricuspid regurgitation signal is inadequate for assessing PA pressure.  EKG:         Medications and allergies    Allergies  Allergen Reactions   Lisinopril Anaphylaxis    Facial swelling     Current Outpatient Medications:    acetaminophen  (TYLENOL ) 325 MG tablet, Take 2 tablets (650 mg total) by mouth every 6 (six) hours as needed., Disp: 120 tablet, Rfl: 0   ALPRAZolam  (XANAX ) 1 MG tablet, 1/2 TO 1 PO QD PRN, Disp: 30 tablet, Rfl: 2   amLODipine  (NORVASC ) 5 MG tablet, Take 1 tablet (5 mg total) by mouth daily., Disp: 90  tablet, Rfl: 1   ibuprofen  (ADVIL ) 800 MG tablet, Take 1 tablet (800 mg total) by mouth every 8 (eight) hours as needed (pain)., Disp: 21 tablet, Rfl: 0   labetalol  (NORMODYNE ) 200 MG tablet, Take 1 tablet (200 mg total) by mouth 2 (two) times daily., Disp: 180 tablet, Rfl: 1   losartan  (COZAAR ) 25 MG tablet, Take 1 tablet (25 mg total) by mouth  daily., Disp: 90 tablet, Rfl: 3   lurasidone  (LATUDA ) 20 MG TABS tablet, Take 1 tablet (20 mg total) by mouth daily., Disp: 90 tablet, Rfl: 3   Multiple Vitamins-Minerals (ONE-A-DAY WOMENS PO), Take 1 tablet by mouth daily., Disp: , Rfl:    omeprazole (PRILOSEC) 20 MG capsule, Take 1 capsule (20 mg total) by mouth daily., Disp: 90 capsule, Rfl: 0   ondansetron  (ZOFRAN ) 4 MG tablet, Take 1 tablet (4 mg total) by mouth every 8 (eight) hours as needed for nausea or vomiting., Disp: 20 tablet, Rfl: 0   valbenazine  (INGREZZA ) 40 MG capsule, Take 1 capsule (40 mg total) by mouth daily. (Patient not taking: Reported on 09/12/2023), Disp: 30 capsule, Rfl: 0   Meds ordered this encounter  Medications   omeprazole (PRILOSEC) 20 MG capsule    Sig: Take 1 capsule (20 mg total) by mouth daily.    Dispense:  90 capsule    Refill:  0    ASSESSMENT AND PLAN: .      ICD-10-CM   1. Palpitations  R00.2     2. Primary hypertension  I10     3. Gastroesophageal reflux disease without esophagitis  K21.9 omeprazole (PRILOSEC) 20 MG capsule    4. Costochondritis  M94.0       Assessment and Plan Assessment & Plan Hypertension   Previously uncontrolled hypertension has improved with the re-initiation of losartan , with a current blood pressure of 135/83 mmHg. Continued management is necessary to prevent complications like left ventricular hypertrophy. Adjust the dose if blood pressure is not adequately controlled. Continue losartan  25 mg once daily, amlodipine  5 mg once daily, and labetalol  200 mg twice daily. Send a note to [employer]. MD for potential dose  adjustment of amlodipine  or losartan  if needed.  She has stopped drinking red bull which could also improve blood pressure control.  She is trying her best to lose weight as well.  Left ventricular hypertrophy   Left ventricular hypertrophy is likely secondary to long-standing hypertension. Strict blood pressure control is essential to prevent further cardiac remodeling. Emphasize lifestyle changes, including dietary modifications and cessation of Red Bull consumption, to control blood pressure.  Benign palpitations   Palpitations are benign and associated with occasional PVCs, with no evidence of atrial fibrillation or structural heart disease. Symptoms correlate with rare PVCs, and heart function is normal. Reassurance is provided regarding the benign nature of the palpitations.  Costochondritis   Tenderness in the chest area is likely due to costochondritis, exacerbated by physical activity. No cardiac etiology is identified. The condition can be recurrent. Recommend Aleve  for 1-2 days as needed for pain, cautioning against prolonged use due to potential effects on blood pressure and kidneys. Advise cold compressions and suggest using over-the-counter Voltaren gel for inflammation.  Gastroesophageal reflux disease (GERD)   Significant acid reflux occurs with every meal, requiring frequent use of Mylanta and Pepcid . Omeprazole is recommended for healing and symptom control. Prescribe omeprazole for daily use for 2-3 weeks, then as needed, taking it on an empty stomach in the morning. Advise avoiding spicy foods, barbecue, and [place] food for 4-6 weeks to allow stomach healing. Consider referral to GI or primary care if symptoms persist.  I will see the patient back on a as needed basis.  Signed,  Knox Perl, MD, [facility] 09/12/2023, 5:26 PM [facility] [address] [place], [place] [address] Phone: [phone]. Fax:  [phone]

## 2023-12-07 DIAGNOSIS — F3176 Bipolar disorder, in full remission, most recent episode depressed: Secondary | ICD-10-CM | POA: Diagnosis not present

## 2023-12-07 DIAGNOSIS — F3174 Bipolar disorder, in full remission, most recent episode manic: Secondary | ICD-10-CM | POA: Diagnosis not present

## 2023-12-07 DIAGNOSIS — F410 Panic disorder [episodic paroxysmal anxiety] without agoraphobia: Secondary | ICD-10-CM | POA: Diagnosis not present

## 2024-01-11 ENCOUNTER — Ambulatory Visit: Payer: Self-pay

## 2024-01-11 NOTE — Telephone Encounter (Signed)
 FYI Only or Action Required?: FYI only for provider.  Patient was last seen in primary care on 07/18/2023 by Diana Clotilda SAUNDERS, MD.  Called Nurse Triage reporting Vaginal Bleedingand cramping  Symptoms began today.  Interventions attempted: OTC medications: Aleve .  Symptoms are: completely resolved.  Triage Disposition: Home Care  Patient/caregiver understands and will follow disposition?: Yes               Copied from CRM #[id number]. Topic: Clinical - Red Word Triage >> Jan 11, 2024  1:22 PM Diana Roman wrote: Red Word that prompted transfer to Nurse Triage: Patient has been having some vaginal bleeding and cramping that is not her period as she had it last week and patient has also been experiencing headaches Reason for Disposition  [1] Bleeding or spotting between regular periods AND [2] occurs three cycles (3 months) or less this past year  Answer Assessment - Initial Assessment Questions 1. BLEEDING SEVERITY: Describe the bleeding that you are having. How much bleeding is there?      Filled up pad x 1 1 clot dime sized 2. ONSET: When did the bleeding begin? Is it continuing now?     This am early  3. MENSTRUAL PERIOD: When was the last normal menstrual period? How is this different than your period?     Last week finished on Friday 4. REGULARITY: How regular are your periods?     Every 28 days and lasts for 7 days  5. ABDOMEN PAIN: Do you have any pain? How bad is the pain?  (e.g., Scale 0-10; none, mild, moderate, or severe)     Cramping 7 now none 6. PREGNANCY: Is there any chance you are pregnant? When was your last menstrual period?     was negative  7. BREASTFEEDING: Are you breastfeeding?     N/a 8. HORMONE MEDICINES: Are you taking any hormone medicines, prescription or over-the-counter? (e.g., birth control pills, estrogen)     no 9. BLOOD THINNER MEDICINES: Do you take any blood thinners? (e.g., Coumadin / warfarin, Pradaxa /  dabigatran, aspirin)     no 10. CAUSE: What do you think is causing the bleeding? (e.g., recent gyn surgery, recent gyn procedure; known bleeding disorder, cervical cancer, polycystic ovarian disease, fibroids)         unsure 11. HEMODYNAMIC STATUS: Are you weak or feeling lightheaded? If Yes, ask: Can you stand and walk normally?        No yes 12. OTHER SYMPTOMS: What other symptoms are you having with the bleeding? (e.g., passed tissue, vaginal discharge, fever, menstrual-type cramps)       Headache (chronic h/o HA  Protocols used: Vaginal Bleeding - Abnormal-A-AH

## 2024-02-02 ENCOUNTER — Other Ambulatory Visit: Payer: Self-pay | Admitting: Family Medicine

## 2024-02-09 ENCOUNTER — Encounter: Payer: Self-pay | Admitting: Family Medicine

## 2024-02-09 ENCOUNTER — Other Ambulatory Visit: Payer: Self-pay | Admitting: Cardiology

## 2024-02-09 ENCOUNTER — Other Ambulatory Visit (HOSPITAL_BASED_OUTPATIENT_CLINIC_OR_DEPARTMENT_OTHER): Payer: Self-pay

## 2024-02-09 ENCOUNTER — Ambulatory Visit (INDEPENDENT_AMBULATORY_CARE_PROVIDER_SITE_OTHER): Admitting: Family Medicine

## 2024-02-09 VITALS — BP 122/84 | HR 83 | Temp 99.4°F | Ht 66.0 in | Wt 229.4 lb

## 2024-02-09 DIAGNOSIS — Z8759 Personal history of other complications of pregnancy, childbirth and the puerperium: Secondary | ICD-10-CM | POA: Diagnosis not present

## 2024-02-09 DIAGNOSIS — Z3201 Encounter for pregnancy test, result positive: Secondary | ICD-10-CM | POA: Diagnosis not present

## 2024-02-09 DIAGNOSIS — Z3A01 Less than 8 weeks gestation of pregnancy: Secondary | ICD-10-CM | POA: Diagnosis not present

## 2024-02-09 DIAGNOSIS — K219 Gastro-esophageal reflux disease without esophagitis: Secondary | ICD-10-CM

## 2024-02-09 DIAGNOSIS — I10 Essential (primary) hypertension: Secondary | ICD-10-CM

## 2024-02-09 LAB — POCT URINE PREGNANCY: Preg Test, Ur: POSITIVE — AB

## 2024-02-09 LAB — HCG, QUANTITATIVE, PREGNANCY: Quantitative HCG: 18332 m[IU]/mL

## 2024-02-09 MED ORDER — LABETALOL HCL 300 MG PO TABS
300.0000 mg | ORAL_TABLET | Freq: Two times a day (BID) | ORAL | 3 refills | Status: AC
  Filled 2024-02-09 – 2024-04-25 (×4): qty 180, 90d supply, fill #0

## 2024-02-09 NOTE — Patient Instructions (Addendum)
 Here is a list of some of the area OB/Gyn providers.  It is not an all inclusive list but should help you get started.  [facility] -Ovid All, MD  -Jerolyn Foil, MD - Jon Rummer, MD -Will Rigg, MD ---Shanda Muscat, MD is listed on their site, but I'm pretty sure she retired.  [facility] -Lavonia Guppy, MD -Ted Solo, DO  [facility] -Rexene Hoit, MD  -Eleanor Jury, DO  [facility] -Rosaline Chapel, MD -Jolene Gaskins, MD  [facility] Robbi Render, MD  [facility] Hobert Quarry, MD  Elvie Pinal, MD ---only does gynecology now, not obstetrics.  [facility] -Dickie Carder, MD

## 2024-02-09 NOTE — Progress Notes (Signed)
 Established Patient Office Visit   Subjective  Patient ID: Diana Roman, female    DOB: 25-Aug-1985  Age: 38 y.o. MRN: [id number]  Chief Complaint  Patient presents with   Acute Visit    Patient came in today for missed menses, patient is having cramping, nausea, discharge, breast tenderness, patient had a pos pregnancy test 5 days ago, and is concerned for miscarriage      Pt is a 38 yo female seen for acute concern.  Pt with missed menses.  LMP was 01/01/24.  Positive at home pregnancy test.    Patient endorses abdominal cramping relieved by Tylenol .  Also with fatigue, nausea, and breast tenderness.  Patient denies bleeding, vomiting.  Pt concerned 2/2 prior miscarriages.  Anxiety has been increased after searching on [employer].   Patient Active Problem List   Diagnosis Date Noted   Hypertension 10/25/2018   Bipolar 1 disorder, depressed, severe (HCC) 03/16/2018   Suicide attempt [facility])    Past Medical History:  Diagnosis Date   Abnormal Pap smear 2011   Anxiety    Bipolar 1 disorder (HCC)    Depression    on meds, doing well   H/O bacterial infection    H/O varicella    Headache(784.0)    Frequent   Hypertension    Infection    UTI   Migraine    No pertinent past medical history    Sickle cell trait    Vaginal Pap smear, abnormal    repeat was normal   Yeast infection    Past Surgical History:  Procedure Laterality Date   DENTAL SURGERY     Social History   Tobacco Use   Smoking status: Never   Smokeless tobacco: Never  Vaping Use   Vaping status: Never Used  Substance Use Topics   Alcohol use: Yes    Alcohol/week: 2.0 standard drinks of alcohol    Types: 2 Glasses of wine per week    Comment: 2/wk   Drug use: No   Family History  Problem Relation Age of Onset   Hypertension Mother    Hypertension Sister    Diabetes Maternal Grandmother    Glaucoma Maternal Grandmother    Sleep apnea Neg Hx    Allergies  Allergen Reactions   Lisinopril Anaphylaxis     Facial swelling    ROS Negative unless stated above    Objective:     BP 122/84 (BP Location: Left Arm, Patient Position: Sitting, Cuff Size: Large)   Pulse 83   Temp 99.4 F (37.4 C) (Oral)   Ht 5' 6 (1.676 m)   Wt 229 lb 6.4 oz (104.1 kg)   LMP 01/01/2024   SpO2 99%   BMI 37.03 kg/m  BP Readings from Last 3 Encounters:  02/09/24 122/84  09/12/23 135/83  07/18/23 (!) 150/100   Wt Readings from Last 3 Encounters:  02/09/24 229 lb 6.4 oz (104.1 kg)  09/12/23 241 lb (109.3 kg)  07/18/23 239 lb 3.2 oz (108.5 kg)      Physical Exam Constitutional:      Appearance: Normal appearance.  HENT:     Head: Normocephalic and atraumatic.     Nose: Nose normal.     Mouth/Throat:     Mouth: Mucous membranes are moist.  Cardiovascular:     Rate and Rhythm: Normal rate.  Pulmonary:     Effort: Pulmonary effort is normal.  Skin:    General: Skin is warm and dry.  Neurological:  Mental Status: She is alert and oriented to person, place, and time.        07/18/2023    2:58 PM 04/26/2023    4:14 PM 10/25/2022    4:34 PM  Depression screen PHQ 2/9  Decreased Interest 2 3 1   Down, Depressed, Hopeless 2 3 1   PHQ - 2 Score 4 6 2   Altered sleeping 2 3 2   Tired, decreased energy 2 3 2   Change in appetite 2 3 1   Feeling bad or failure about yourself  1 3 1   Trouble concentrating 2 3 0  Moving slowly or fidgety/restless 1 3 0  Suicidal thoughts 0 0 0  PHQ-9 Score 14 24 8   Difficult doing work/chores Extremely dIfficult Extremely dIfficult Somewhat difficult      07/18/2023    2:59 PM 04/26/2023    4:14 PM 10/25/2022    4:35 PM 03/08/2022    2:16 PM  GAD 7 : Generalized Anxiety Score  Nervous, Anxious, on Edge 2 3 1 3   Control/stop worrying 2 3 1 3   Worry too much - different things 2 3 1 3   Trouble relaxing 2 3 1 3   Restless 0 3 1 3   Easily annoyed or irritable 3 3 1 3   Afraid - awful might happen 3 3 0 3  Total GAD 7 Score 14 21 6 21   Anxiety Difficulty  Extremely difficult Extremely difficult Somewhat difficult Extremely difficult     Results for orders placed or performed in visit on 02/09/24  POCT urine pregnancy  Result Value Ref Range   Preg Test, Ur Positive (A) Negative      Assessment & Plan:   Less than [redacted] weeks gestation of pregnancy  Positive pregnancy test -     POCT urine pregnancy -     hCG, quantitative, pregnancy  Essential hypertension -     Labetalol  HCl; Take 1 tablet (300 mg total) by mouth 2 (two) times daily.  Dispense: 180 tablet; Refill: 3  History of miscarriage  Absent menses.  Positive POC UA hCG.  Obtain quantitative hCG.  BP controlled.  Norvasc  5 mg daily discontinued.  Increase labetalol  from 200 mg twice daily to 300 mg twice daily.  Monitor BP at home.  Start PNV.  Obtain imaging if quant not at expected levels for EGA.  Schedule appointment with OB/GYN.  Return if symptoms worsen or fail to improve.   Clotilda JONELLE Single, MD

## 2024-02-10 ENCOUNTER — Other Ambulatory Visit: Payer: Self-pay

## 2024-02-10 ENCOUNTER — Other Ambulatory Visit (HOSPITAL_BASED_OUTPATIENT_CLINIC_OR_DEPARTMENT_OTHER): Payer: Self-pay

## 2024-02-10 MED ORDER — OMEPRAZOLE 20 MG PO CPDR
20.0000 mg | DELAYED_RELEASE_CAPSULE | Freq: Every day | ORAL | 2 refills | Status: DC
  Filled 2024-02-10 – 2024-02-28 (×2): qty 90, 90d supply, fill #0

## 2024-02-14 ENCOUNTER — Ambulatory Visit: Payer: Self-pay | Admitting: Family Medicine

## 2024-02-20 ENCOUNTER — Other Ambulatory Visit (HOSPITAL_BASED_OUTPATIENT_CLINIC_OR_DEPARTMENT_OTHER): Payer: Self-pay

## 2024-02-24 ENCOUNTER — Ambulatory Visit: Payer: Self-pay

## 2024-02-24 NOTE — Telephone Encounter (Signed)
 FYI Only or Action Required?: FYI only for provider.  Patient was last seen in primary care on 02/09/2024 by Mercer Clotilda SAUNDERS, MD.  Called Nurse Triage reporting Abdominal Cramping.  Symptoms began several days ago.  Interventions attempted: Rest, hydration, or home remedies.  Symptoms are: unchanged.  Triage Disposition: See Physician Within 24 Hours  Patient/caregiver understands and will follow disposition?: Yes  Copied from CRM [id number]. Topic: Clinical - Red Word Triage >> Feb 24, 2024  4:42 PM Roselie BROCKS wrote: Red Word that prompted transfer to Nurse Triage: Patient states she is experiencing extreme nausea and pain Reason for Disposition  [1] Intermittent lower abdominal pain (e.g., cramping) AND [2] present > 24 hours  Answer Assessment - Initial Assessment Questions Pt recently found out pregnant. Approx [redacted] weeks pregnant. States her nausea is severe all day long. Admits to intermittent abd cramping. States it moves sides and is worse when laying flat. ED/UC recommendation at this time d/t no available appointments and not established with OB yet.   1. LOCATION: Where does it hurt?      Abd cramping 2. RADIATION: Does the pain shoot anywhere else? (e.g., chest, back, shoulder)     denies 3. ONSET: When did the pain begin? (e.g., minutes, hours or days ago)      sudden 4. ONSET: Gradual or sudden onset?     gradual 5. PATTERN Does the pain come and go, or has it been constant since it started?      Intermittent. Switches sides while laying down at home 6. SEVERITY: How bad is the pain? What does it keep you from doing?  (e.g., Scale 1-10; mild, moderate, or severe)     3/10 7. RECURRENT SYMPTOM: Have you ever had this type of stomach pain before? If Yes, ask: When was the last time? and What happened that time?      denies 8. CAUSE: What do you think is causing the stomach pain?     pregnancy 9. RELIEVING/AGGRAVATING FACTORS: What makes it  better or worse? (e.g., antacids, bowel movement, movement)     Laying flat  10. OTHER SYMPTOMS: Do you have any other symptoms? (e.g., back pain, diarrhea, fever, urination pain, vaginal bleeding, vaginal discharge, vomiting)       Nausea is the worst aspect. Denies bleeding. Nauseous all day.  11. EDD: What date are you expecting to deliver?       unsure  Protocols used: Pregnancy - Abdominal Pain Less Than [redacted] Weeks EGA-A-AH

## 2024-02-24 NOTE — Telephone Encounter (Signed)
 Called patient left a VM, patient needs to be seen by OBGYN,

## 2024-02-27 NOTE — Telephone Encounter (Signed)
 Patient states she will wait until 03/06/24 and see OB/GYN.

## 2024-02-28 ENCOUNTER — Other Ambulatory Visit (HOSPITAL_BASED_OUTPATIENT_CLINIC_OR_DEPARTMENT_OTHER): Payer: Self-pay

## 2024-02-28 ENCOUNTER — Other Ambulatory Visit (HOSPITAL_COMMUNITY): Payer: Self-pay

## 2024-03-06 DIAGNOSIS — I10 Essential (primary) hypertension: Secondary | ICD-10-CM | POA: Diagnosis not present

## 2024-03-06 DIAGNOSIS — O219 Vomiting of pregnancy, unspecified: Secondary | ICD-10-CM | POA: Diagnosis not present

## 2024-03-06 DIAGNOSIS — N912 Amenorrhea, unspecified: Secondary | ICD-10-CM | POA: Diagnosis not present

## 2024-03-06 DIAGNOSIS — O139 Gestational [pregnancy-induced] hypertension without significant proteinuria, unspecified trimester: Secondary | ICD-10-CM | POA: Diagnosis not present

## 2024-03-06 DIAGNOSIS — O09521 Supervision of elderly multigravida, first trimester: Secondary | ICD-10-CM | POA: Diagnosis not present

## 2024-03-06 DIAGNOSIS — Z369 Encounter for antenatal screening, unspecified: Secondary | ICD-10-CM | POA: Diagnosis not present

## 2024-03-08 ENCOUNTER — Telehealth: Payer: Self-pay | Admitting: Cardiology

## 2024-03-08 NOTE — Telephone Encounter (Signed)
 I recently found out I was pregnant and wanted to update him and see if there's anything we need to change or start.

## 2024-03-08 NOTE — Telephone Encounter (Signed)
 Spoke with pt, she is taking labetalol  and losartan . Aware will forward to dr ladona and pharmacist to review and advise.

## 2024-03-09 ENCOUNTER — Other Ambulatory Visit (HOSPITAL_BASED_OUTPATIENT_CLINIC_OR_DEPARTMENT_OTHER): Payer: Self-pay

## 2024-03-09 NOTE — Telephone Encounter (Signed)
 Diana Heinz, MD to [facility] Triage  Cv Div Pharmd (Selected Message)     03/08/24  6:50 PM Stop Losartan  ASAP. Needs follow up with her PCP or OB for change in therapy.    Left message of the above instructions for pt on her home voicemail (OK per DPR).  Requested she call back with any further questions or concerns.  Losartan  discontinued from her medication list.

## 2024-03-20 ENCOUNTER — Other Ambulatory Visit: Payer: Self-pay | Admitting: Family Medicine

## 2024-03-20 DIAGNOSIS — O99210 Obesity complicating pregnancy, unspecified trimester: Secondary | ICD-10-CM | POA: Diagnosis not present

## 2024-03-20 DIAGNOSIS — Z8759 Personal history of other complications of pregnancy, childbirth and the puerperium: Secondary | ICD-10-CM | POA: Diagnosis not present

## 2024-03-20 DIAGNOSIS — O09521 Supervision of elderly multigravida, first trimester: Secondary | ICD-10-CM | POA: Diagnosis not present

## 2024-03-20 DIAGNOSIS — Z3A11 11 weeks gestation of pregnancy: Secondary | ICD-10-CM | POA: Diagnosis not present

## 2024-03-20 DIAGNOSIS — Z3490 Encounter for supervision of normal pregnancy, unspecified, unspecified trimester: Secondary | ICD-10-CM | POA: Diagnosis not present

## 2024-04-06 ENCOUNTER — Inpatient Hospital Stay (HOSPITAL_COMMUNITY)
Admission: AD | Admit: 2024-04-06 | Discharge: 2024-04-06 | Disposition: A | Discharge status: Home / Self Care | Attending: Obstetrics & Gynecology | Admitting: Obstetrics & Gynecology

## 2024-04-06 ENCOUNTER — Encounter (HOSPITAL_COMMUNITY): Payer: Self-pay

## 2024-04-06 ENCOUNTER — Other Ambulatory Visit: Payer: Self-pay

## 2024-04-06 DIAGNOSIS — K5900 Constipation, unspecified: Secondary | ICD-10-CM | POA: Diagnosis not present

## 2024-04-06 DIAGNOSIS — O10011 Pre-existing essential hypertension complicating pregnancy, first trimester: Secondary | ICD-10-CM | POA: Diagnosis not present

## 2024-04-06 DIAGNOSIS — Z3A13 13 weeks gestation of pregnancy: Secondary | ICD-10-CM | POA: Insufficient documentation

## 2024-04-06 DIAGNOSIS — Z79899 Other long term (current) drug therapy: Secondary | ICD-10-CM | POA: Insufficient documentation

## 2024-04-06 DIAGNOSIS — O10919 Unspecified pre-existing hypertension complicating pregnancy, unspecified trimester: Secondary | ICD-10-CM

## 2024-04-06 DIAGNOSIS — O10911 Unspecified pre-existing hypertension complicating pregnancy, first trimester: Secondary | ICD-10-CM | POA: Diagnosis not present

## 2024-04-06 DIAGNOSIS — O219 Vomiting of pregnancy, unspecified: Secondary | ICD-10-CM | POA: Diagnosis present

## 2024-04-06 DIAGNOSIS — O99611 Diseases of the digestive system complicating pregnancy, first trimester: Secondary | ICD-10-CM | POA: Diagnosis not present

## 2024-04-06 LAB — URINALYSIS, ROUTINE W REFLEX MICROSCOPIC
Bilirubin Urine: NEGATIVE
Glucose, UA: NEGATIVE mg/dL
Hgb urine dipstick: NEGATIVE
Ketones, ur: NEGATIVE mg/dL
Leukocytes,Ua: NEGATIVE
Nitrite: NEGATIVE
Protein, ur: NEGATIVE mg/dL
Specific Gravity, Urine: 1.025 (ref 1.005–1.030)
pH: 6 (ref 5.0–8.0)

## 2024-04-06 LAB — WET PREP, GENITAL
Clue Cells Wet Prep HPF POC: NONE SEEN
Sperm: NONE SEEN
Trich, Wet Prep: NONE SEEN
WBC, Wet Prep HPF POC: <10 (ref <10)
Yeast Wet Prep HPF POC: NONE SEEN

## 2024-04-06 MED ORDER — AMLODIPINE BESYLATE 5 MG PO TABS: 5.0000 mg | ORAL_TABLET | Freq: Every day | ORAL | 11 refills | Status: AC

## 2024-04-06 NOTE — MAU Note (Signed)
 Diana Roman is a 38 y.o. at [redacted]w[redacted]d here in MAU reporting: couple of days increase in vaginal discharge- white and mucous and lower abd pain and pressure. Also has a HA. Denies VB Pt states being on BP meds-  LMP: na Onset of complaint: couple of days Pain score: 9/10 head, abd 9/10 Vitals:   04/06/24 1404  BP: (!) 148/92  Pulse: 71  Resp: 18  Temp: 99.4 F (37.4 C)  SpO2: 100%     FHT: 166  Lab orders placed from triage: ua, swab

## 2024-04-06 NOTE — MAU Provider Note (Cosign Needed Addendum)
 Chief Complaint:  Headache, Vaginal Discharge, and Abdominal Pain   HPI   Diana Roman is a 38 y.o. G3P1001 at [redacted]w[redacted]d who presents to maternity admissions reporting cramping, increasing discharge, and headache. She relays the onset of these symptoms this week. She has noticed increased cramping in the last few days that she originally communicated to her provider at [facility] who advised her to present to MAU if symptoms worsen. She describes symptoms as cramping pain and squeezing that radiate from inguinal groin area to the lower abdomen. She has read information about round ligament pain and was originally suspicious of this being normal pain, but was woken up at 4 am with this pain and has missed several days of work from it and decided to seek care at MAU. She has taken tylenol  intermittently for this pain which provides partial but not complete relief. The last time she took tylenol  was 11/19. She has also noticed increased quantity of liquidy, mucousy, white discharge that saturated her underwear. She has been wearing panty liners in this time. She denies vaginal bleeding, dysuria, hematuria, RUQ pain, recent trauma, abnormal vaginal odor or itching. Her last sexual intercourse was 2 weeks ago.   She has also had intense headaches in this weeks that she associates with dizziness and nausea and vomiting 2/2 this dizziness. This has happened 2 times this week. In regard to her cHTN, after finding out she was pregnant her cardiology took her off of losartan  and she was also taken off of amlodipine  by her PCP. Her current regimen is labetalol  300mg  BID, which is a medication she was also on previous for her cHTN. She took her BP twice last night and reports it was 144/91 at 5 pm and 142/87 at 10 pm. She denies blurry vision.     Past Medical History:  Diagnosis Date   Abnormal Pap smear 2011   Anxiety    Bipolar 1 disorder (HCC)    Depression    on meds, doing well   H/O bacterial infection     H/O varicella    Headache(784.0)    Frequent   Hypertension    Infection    UTI   Migraine    No pertinent past medical history    Sickle cell trait    Vaginal Pap smear, abnormal    repeat was normal   Yeast infection    OB History  Gravida Para Term Preterm AB Living  3 1 1  0 0 1  SAB IAB Ectopic Multiple Live Births  0 0 0 0 1    # Outcome Date GA Lbr Len/2nd Weight Sex Type Anes PTL Lv  3 Current           2 Term 04/09/06    F Vag-Spont  N LIV  1 Gravida            Past Surgical History:  Procedure Laterality Date   DENTAL SURGERY     Family History  Problem Relation Age of Onset   Hypertension Mother    Hypertension Sister    Diabetes Maternal Grandmother    Glaucoma Maternal Grandmother    Sleep apnea Neg Hx    Social History   Tobacco Use   Smoking status: Never   Smokeless tobacco: Never  Vaping Use   Vaping status: Never Used  Substance Use Topics   Alcohol use: Yes    Alcohol/week: 2.0 standard drinks of alcohol    Types: 2 Glasses of wine per week  Comment: 2/wk   Drug use: No   Allergies  Allergen Reactions   Lisinopril Anaphylaxis    Facial swelling   Medications Prior to Admission  Medication Sig Dispense Refill Last Dose/Taking   labetalol  (NORMODYNE ) 300 MG tablet Take 1 tablet (300 mg total) by mouth 2 (two) times daily. 180 tablet 3 04/05/2024   lurasidone  (LATUDA ) 20 MG TABS tablet Take 1 tablet (20 mg total) by mouth daily. 90 tablet 3 04/05/2024   Multiple Vitamins-Minerals (ONE-A-DAY WOMENS PO) Take 1 tablet by mouth daily.   04/05/2024   ondansetron  (ZOFRAN ) 4 MG tablet Take 1 tablet (4 mg total) by mouth every 8 (eight) hours as needed for nausea or vomiting. 20 tablet 0 04/05/2024   acetaminophen  (TYLENOL ) 325 MG tablet Take 2 tablets (650 mg total) by mouth every 6 (six) hours as needed. 120 tablet 0    ALPRAZolam  (XANAX ) 1 MG tablet 1/2 TO 1 PO QD PRN 30 tablet 2    ibuprofen  (ADVIL ) 800 MG tablet Take 1 tablet (800 mg  total) by mouth every 8 (eight) hours as needed (pain). (Patient not taking: Reported on 04/06/2024) 21 tablet 0 Not Taking   omeprazole  (PRILOSEC) 20 MG capsule Take 1 capsule (20 mg total) by mouth daily. (Patient not taking: Reported on 04/06/2024) 90 capsule 2 Not Taking   valbenazine  (INGREZZA ) 40 MG capsule Take 1 capsule (40 mg total) by mouth daily. (Patient not taking: Reported on 04/06/2024) 30 capsule 0 Not Taking    I have reviewed patient's Past Medical Hx, Surgical Hx, Family Hx, Social Hx, medications and allergies.   ROS  Pertinent items noted in HPI and remainder of comprehensive ROS otherwise negative.   PHYSICAL EXAM  Patient Vitals for the past 24 hrs:  BP Temp Temp src Pulse Resp SpO2 Height Weight  04/06/24 1404 (!) 148/92 99.4 F (37.4 C) Oral 71 18 100 % 5' 6 (1.676 m) 104.6 kg    Constitutional: Well-developed, well-nourished female in no acute distress.  Cardiovascular: normal rate & rhythm, warm and well-perfused Respiratory: normal effort, no problems with respiration noted GI: Abd soft, non-tender, gravid MS: Extremities nontender, no edema Neurologic: Alert and oriented x 4.     Labs: No results found for this or any previous visit (from the past 24 hours).  Imaging:  No results found.  MDM & MAU COURSE  MDM: Low  MAU Course: -GC/Chlamydia pending Orders Placed This Encounter  Procedures   Wet prep, genital   Urinalysis, Routine w reflex microscopic -Urine, Clean Catch   No orders of the defined types were placed in this encounter.     Medications ordered as stated below.  A/P as described below.  Counseling and education provided and patient agreeable  with plan as described below. Verbalized understanding.    ASSESSMENT  No diagnosis found.  PLAN  Diana Roman is a 38 year old G3P1 at [redacted]w[redacted]d who presents with concern for increased cramping, vaginal discharge, and headache. Increased vaginal discharge and cramping in this time period  could increase suspicion for STI, candida, or BV. Wet prep is reassuring that cause of discharge is not BV or candida. G/C chlamydia swab is pending. UTI or nephrolithiasis is also on the differential, and UA completed makes this less likely. Patient history of constipation and pellet-sized bowel movements increases possibility of constipation-related abdominal cramping. Patient description of pain makes this less likely round-ligament pain.   Headache in the context of recent BP regimen change and cHTN combined with dizziness  and 2/2 nausea increase suspicion for cHTN exacerbation in pregnancy. Important to use shared decision making to adjust medication regimen to reintroduce amlodipine .   -start amlodipine  5mg  every day  -Miralax instructions provided and colace -recommendation for increased water intake -Discharge home in stable condition with return precautions.   See AVS for full description of information given to the patient including both verbal and written. Patient verbalized understanding and agrees with the plan as described above.   Brad Prey, MS3 [facility], Center for [facility]   Attestation of Supervision of Student:  I confirm that I have verified the information documented in the medical student's note and that I have also personally reperformed the history, physical exam and all medical decision making activities.  I have verified that all services and findings are accurately documented in this student's note; and I agree with management and plan as outlined in the documentation. I have also made any necessary editorial changes.  Patient with cramping groin pain. Endorses constipation. No VB or LOF. Findings consistent with constipation.   Barkley LITTIE Angles, MD OB Fellow 04/06/2024 4:32 PM

## 2024-04-09 LAB — GC/CHLAMYDIA PROBE AMP (~~LOC~~) NOT AT ARMC
Chlamydia: NEGATIVE
Comment: NEGATIVE
Comment: NORMAL
Neisseria Gonorrhea: NEGATIVE

## 2024-04-13 ENCOUNTER — Encounter: Payer: Self-pay | Admitting: Cardiology

## 2024-04-13 ENCOUNTER — Other Ambulatory Visit (HOSPITAL_COMMUNITY): Payer: Self-pay

## 2024-04-18 ENCOUNTER — Other Ambulatory Visit: Payer: Self-pay | Admitting: Obstetrics and Gynecology

## 2024-04-18 DIAGNOSIS — O09522 Supervision of elderly multigravida, second trimester: Secondary | ICD-10-CM

## 2024-04-24 ENCOUNTER — Other Ambulatory Visit (HOSPITAL_COMMUNITY): Payer: Self-pay

## 2024-04-25 ENCOUNTER — Other Ambulatory Visit (HOSPITAL_COMMUNITY): Payer: Self-pay

## 2024-06-05 ENCOUNTER — Ambulatory Visit
